# Patient Record
Sex: Female | Born: 1970
Health system: Southern US, Community
[De-identification: ages and names within clinical notes are randomized; demographics above are authoritative.]

## PROBLEM LIST (undated history)

## (undated) ENCOUNTER — Emergency Department (HOSPITAL_COMMUNITY): Admission: EM | Disposition: A | Discharge status: Home / Self Care | Payer: Self-pay

## (undated) DIAGNOSIS — I7300 Raynaud's syndrome without gangrene: Secondary | ICD-10-CM

## (undated) DIAGNOSIS — K219 Gastro-esophageal reflux disease without esophagitis: Secondary | ICD-10-CM

## (undated) DIAGNOSIS — M19112 Post-traumatic osteoarthritis, left shoulder: Secondary | ICD-10-CM

## (undated) DIAGNOSIS — K6389 Other specified diseases of intestine: Secondary | ICD-10-CM

## (undated) DIAGNOSIS — K8689 Other specified diseases of pancreas: Secondary | ICD-10-CM

## (undated) DIAGNOSIS — R569 Unspecified convulsions: Secondary | ICD-10-CM

## (undated) DIAGNOSIS — S6991XA Unspecified injury of right wrist, hand and finger(s), initial encounter: Secondary | ICD-10-CM

## (undated) DIAGNOSIS — K760 Fatty (change of) liver, not elsewhere classified: Secondary | ICD-10-CM

## (undated) DIAGNOSIS — R0600 Dyspnea, unspecified: Secondary | ICD-10-CM

## (undated) DIAGNOSIS — G8929 Other chronic pain: Secondary | ICD-10-CM

## (undated) DIAGNOSIS — R0789 Other chest pain: Secondary | ICD-10-CM

## (undated) DIAGNOSIS — D649 Anemia, unspecified: Secondary | ICD-10-CM

## (undated) DIAGNOSIS — F32A Depression, unspecified: Secondary | ICD-10-CM

## (undated) DIAGNOSIS — M549 Dorsalgia, unspecified: Secondary | ICD-10-CM

## (undated) DIAGNOSIS — R51 Headache: Secondary | ICD-10-CM

## (undated) DIAGNOSIS — N200 Calculus of kidney: Secondary | ICD-10-CM

## (undated) DIAGNOSIS — F5102 Adjustment insomnia: Secondary | ICD-10-CM

## (undated) DIAGNOSIS — IMO0001 Reserved for inherently not codable concepts without codable children: Secondary | ICD-10-CM

## (undated) DIAGNOSIS — F419 Anxiety disorder, unspecified: Secondary | ICD-10-CM

## (undated) DIAGNOSIS — Z5189 Encounter for other specified aftercare: Secondary | ICD-10-CM

## (undated) DIAGNOSIS — E7800 Pure hypercholesterolemia, unspecified: Secondary | ICD-10-CM

## (undated) DIAGNOSIS — K529 Noninfective gastroenteritis and colitis, unspecified: Secondary | ICD-10-CM

## (undated) DIAGNOSIS — G43909 Migraine, unspecified, not intractable, without status migrainosus: Secondary | ICD-10-CM

## (undated) DIAGNOSIS — M542 Cervicalgia: Secondary | ICD-10-CM

## (undated) DIAGNOSIS — R0609 Other forms of dyspnea: Secondary | ICD-10-CM

## (undated) DIAGNOSIS — I10 Essential (primary) hypertension: Secondary | ICD-10-CM

## (undated) DIAGNOSIS — I499 Cardiac arrhythmia, unspecified: Secondary | ICD-10-CM

## (undated) DIAGNOSIS — E559 Vitamin D deficiency, unspecified: Secondary | ICD-10-CM

## (undated) DIAGNOSIS — Z889 Allergy status to unspecified drugs, medicaments and biological substances status: Secondary | ICD-10-CM

## (undated) DIAGNOSIS — F329 Major depressive disorder, single episode, unspecified: Secondary | ICD-10-CM

## (undated) HISTORY — DX: Other specified diseases of intestine: K63.89

## (undated) HISTORY — DX: Depression, unspecified: F32.A

## (undated) HISTORY — DX: Migraine, unspecified, not intractable, without status migrainosus: G43.909

## (undated) HISTORY — PX: BACK SURGERY: SHX140

## (undated) HISTORY — PX: ABDOMINAL HYSTERECTOMY: SHX81

## (undated) HISTORY — PX: ABDOMINOPLASTY: SUR9

## (undated) HISTORY — DX: Gastro-esophageal reflux disease without esophagitis: K21.9

## (undated) HISTORY — DX: Adjustment insomnia: F51.02

## (undated) HISTORY — DX: Anxiety disorder, unspecified: F41.9

## (undated) HISTORY — DX: Hemochromatosis, unspecified: E83.119

## (undated) HISTORY — DX: Calculus of kidney: N20.0

## (undated) HISTORY — DX: Allergy status to unspecified drugs, medicaments and biological substances: Z88.9

## (undated) HISTORY — DX: Fatty (change of) liver, not elsewhere classified: K76.0

## (undated) HISTORY — PX: SHOULDER SURGERY: SHX246

## (undated) HISTORY — DX: Noninfective gastroenteritis and colitis, unspecified: K52.9

## (undated) HISTORY — DX: Major depressive disorder, single episode, unspecified: F32.9

## (undated) HISTORY — DX: Pure hypercholesterolemia, unspecified: E78.00

## (undated) HISTORY — DX: Other specified diseases of pancreas: K86.89

## (undated) HISTORY — DX: Raynaud's syndrome without gangrene: I73.00

## (undated) HISTORY — PX: BREAST SURGERY: SHX581

## (undated) HISTORY — DX: Other chest pain: R07.89

## (undated) HISTORY — DX: Dyspnea, unspecified: R06.00

## (undated) HISTORY — PX: CERVICAL SPINE SURGERY: SHX589

## (undated) HISTORY — DX: Vitamin D deficiency, unspecified: E55.9

## (undated) HISTORY — DX: Other forms of dyspnea: R06.09

## (undated) HISTORY — PX: OTHER SURGICAL HISTORY: SHX169

---

## 2007-03-11 ENCOUNTER — Emergency Department (HOSPITAL_COMMUNITY): Admission: EM | Admit: 2007-03-11 | Discharge: 2007-03-12 | Payer: Self-pay | Admitting: Emergency Medicine

## 2007-04-07 ENCOUNTER — Encounter: Admission: RE | Admit: 2007-04-07 | Discharge: 2007-04-07 | Payer: Self-pay | Admitting: Gastroenterology

## 2007-07-08 ENCOUNTER — Encounter: Admission: RE | Admit: 2007-07-08 | Discharge: 2007-07-08 | Payer: Self-pay | Admitting: Orthopaedic Surgery

## 2007-07-27 ENCOUNTER — Other Ambulatory Visit: Admission: RE | Admit: 2007-07-27 | Discharge: 2007-07-27 | Payer: Self-pay | Admitting: Gynecology

## 2007-08-06 ENCOUNTER — Encounter: Admission: RE | Admit: 2007-08-06 | Discharge: 2007-08-06 | Payer: Self-pay | Admitting: Orthopaedic Surgery

## 2007-09-14 ENCOUNTER — Inpatient Hospital Stay (HOSPITAL_COMMUNITY): Admission: RE | Admit: 2007-09-14 | Discharge: 2007-09-16 | Payer: Self-pay | Admitting: Gynecology

## 2007-09-14 ENCOUNTER — Encounter: Payer: Self-pay | Admitting: Gynecology

## 2007-10-13 ENCOUNTER — Ambulatory Visit: Payer: Self-pay | Admitting: Gynecology

## 2007-10-27 ENCOUNTER — Ambulatory Visit: Payer: Self-pay | Admitting: Gynecology

## 2007-12-09 ENCOUNTER — Ambulatory Visit: Payer: Self-pay | Admitting: Gynecology

## 2008-05-19 ENCOUNTER — Inpatient Hospital Stay (HOSPITAL_COMMUNITY): Admission: AD | Admit: 2008-05-19 | Discharge: 2008-05-19 | Payer: Self-pay | Admitting: Family Medicine

## 2008-05-19 ENCOUNTER — Ambulatory Visit: Payer: Self-pay | Admitting: Obstetrics and Gynecology

## 2008-09-19 ENCOUNTER — Emergency Department (HOSPITAL_BASED_OUTPATIENT_CLINIC_OR_DEPARTMENT_OTHER): Admission: EM | Admit: 2008-09-19 | Discharge: 2008-09-19 | Payer: Self-pay | Admitting: Emergency Medicine

## 2008-09-19 ENCOUNTER — Ambulatory Visit: Payer: Self-pay | Admitting: Diagnostic Radiology

## 2009-03-28 ENCOUNTER — Ambulatory Visit: Payer: Self-pay | Admitting: Interventional Radiology

## 2009-03-28 ENCOUNTER — Emergency Department (HOSPITAL_BASED_OUTPATIENT_CLINIC_OR_DEPARTMENT_OTHER): Admission: EM | Admit: 2009-03-28 | Discharge: 2009-03-28 | Payer: Self-pay | Admitting: Emergency Medicine

## 2009-04-13 ENCOUNTER — Inpatient Hospital Stay (HOSPITAL_COMMUNITY): Admission: AD | Admit: 2009-04-13 | Discharge: 2009-04-13 | Payer: Self-pay | Admitting: Obstetrics & Gynecology

## 2009-05-11 ENCOUNTER — Ambulatory Visit: Payer: Self-pay | Admitting: Obstetrics and Gynecology

## 2009-05-11 LAB — CONVERTED CEMR LAB
ALT: 12 units/L (ref 0–35)
AST: 14 units/L (ref 0–37)
BUN: 11 mg/dL (ref 6–23)
Calcium: 9.6 mg/dL (ref 8.4–10.5)
Creatinine, Ser: 0.54 mg/dL (ref 0.40–1.20)
HCT: 41.1 % (ref 36.0–46.0)
Hemoglobin: 13.6 g/dL (ref 12.0–15.0)
MCV: 97.2 fL (ref 78.0–100.0)
Platelets: 278 10*3/uL (ref 150–400)
Potassium: 3.7 meq/L (ref 3.5–5.3)
RBC: 4.23 M/uL (ref 3.87–5.11)
RDW: 13.7 % (ref 11.5–15.5)
Sodium: 140 meq/L (ref 135–145)
TSH: 1.254 microintl units/mL (ref 0.350–4.500)
Total Protein: 7.2 g/dL (ref 6.0–8.3)

## 2009-06-05 ENCOUNTER — Encounter: Payer: Self-pay | Admitting: Family Medicine

## 2009-06-15 ENCOUNTER — Emergency Department (HOSPITAL_COMMUNITY): Admission: EM | Admit: 2009-06-15 | Discharge: 2009-06-15 | Payer: Self-pay | Admitting: Emergency Medicine

## 2009-06-21 ENCOUNTER — Encounter: Admission: RE | Admit: 2009-06-21 | Discharge: 2009-06-21 | Payer: Self-pay | Admitting: Chiropractic Medicine

## 2009-08-10 ENCOUNTER — Emergency Department (HOSPITAL_COMMUNITY): Admission: EM | Admit: 2009-08-10 | Discharge: 2009-08-10 | Payer: Self-pay | Admitting: Emergency Medicine

## 2009-11-13 ENCOUNTER — Emergency Department (HOSPITAL_BASED_OUTPATIENT_CLINIC_OR_DEPARTMENT_OTHER): Admission: EM | Admit: 2009-11-13 | Discharge: 2009-11-13 | Payer: Self-pay | Admitting: Emergency Medicine
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
## 2010-03-25 ENCOUNTER — Emergency Department (HOSPITAL_BASED_OUTPATIENT_CLINIC_OR_DEPARTMENT_OTHER)
Admission: EM | Admit: 2010-03-25 | Discharge: 2010-03-25 | Disposition: A | Discharge status: Home / Self Care | Payer: Self-pay | Attending: Emergency Medicine | Admitting: Emergency Medicine

## 2010-03-25 ENCOUNTER — Emergency Department (INDEPENDENT_AMBULATORY_CARE_PROVIDER_SITE_OTHER): Payer: Self-pay

## 2010-03-25 DIAGNOSIS — M542 Cervicalgia: Secondary | ICD-10-CM

## 2010-03-25 DIAGNOSIS — R569 Unspecified convulsions: Secondary | ICD-10-CM | POA: Insufficient documentation

## 2010-03-25 DIAGNOSIS — H9319 Tinnitus, unspecified ear: Secondary | ICD-10-CM | POA: Insufficient documentation

## 2010-03-25 DIAGNOSIS — R51 Headache: Secondary | ICD-10-CM | POA: Insufficient documentation

## 2010-03-25 DIAGNOSIS — R42 Dizziness and giddiness: Secondary | ICD-10-CM

## 2010-04-15 LAB — COMPREHENSIVE METABOLIC PANEL
AST: 23 U/L (ref 0–37)
Albumin: 4.2 g/dL (ref 3.5–5.2)
Alkaline Phosphatase: 38 U/L — ABNORMAL LOW (ref 39–117)
BUN: 14 mg/dL (ref 6–23)
Chloride: 109 mEq/L (ref 96–112)
Potassium: 4.2 mEq/L (ref 3.5–5.1)
Total Bilirubin: 1 mg/dL (ref 0.3–1.2)

## 2010-04-15 LAB — URINALYSIS, ROUTINE W REFLEX MICROSCOPIC
Glucose, UA: NEGATIVE mg/dL
Hgb urine dipstick: NEGATIVE
Nitrite: NEGATIVE

## 2010-04-15 LAB — CBC
HCT: 40.7 % (ref 36.0–46.0)
RDW: 13.6 % (ref 11.5–15.5)
WBC: 7.6 10*3/uL (ref 4.0–10.5)

## 2010-04-15 LAB — DIFFERENTIAL
Basophils Absolute: 0 10*3/uL (ref 0.0–0.1)
Basophils Relative: 1 % (ref 0–1)
Eosinophils Absolute: 0 10*3/uL (ref 0.0–0.7)
Eosinophils Relative: 1 % (ref 0–5)
Monocytes Relative: 8 % (ref 3–12)
Neutro Abs: 5 10*3/uL (ref 1.7–7.7)

## 2010-04-15 LAB — POCT PREGNANCY, URINE: Preg Test, Ur: NEGATIVE

## 2010-04-15 LAB — LIPASE, BLOOD: Lipase: 35 U/L (ref 11–59)

## 2010-04-22 LAB — RAPID URINE DRUG SCREEN, HOSP PERFORMED
Amphetamines: NOT DETECTED
Benzodiazepines: NOT DETECTED
Cocaine: NOT DETECTED
Opiates: NOT DETECTED
Tetrahydrocannabinol: NOT DETECTED

## 2010-04-22 LAB — URINALYSIS, ROUTINE W REFLEX MICROSCOPIC: Urobilinogen, UA: 0.2 mg/dL (ref 0.0–1.0)

## 2010-04-22 LAB — POCT PREGNANCY, URINE: Preg Test, Ur: NEGATIVE

## 2010-04-23 LAB — COMPREHENSIVE METABOLIC PANEL
ALT: 18 U/L (ref 0–35)
AST: 18 U/L (ref 0–37)
CO2: 26 mEq/L (ref 19–32)
Calcium: 9 mg/dL (ref 8.4–10.5)
Chloride: 110 mEq/L (ref 96–112)
GFR calc Af Amer: >60 mL/min (ref >60)
GFR calc non Af Amer: >60 mL/min (ref >60)
Glucose, Bld: 89 mg/dL (ref 70–99)
Sodium: 143 mEq/L (ref 135–145)
Total Bilirubin: 0.3 mg/dL (ref 0.3–1.2)

## 2010-04-23 LAB — URINALYSIS, ROUTINE W REFLEX MICROSCOPIC
Bilirubin Urine: NEGATIVE
Hgb urine dipstick: NEGATIVE
Nitrite: NEGATIVE
Specific Gravity, Urine: 1.003 — ABNORMAL LOW (ref 1.005–1.030)
Urobilinogen, UA: 0.2 mg/dL (ref 0.0–1.0)
pH: 6.5 (ref 5.0–8.0)

## 2010-04-23 LAB — DIFFERENTIAL
Basophils Absolute: 0.1 10*3/uL (ref 0.0–0.1)
Basophils Relative: 1 % (ref 0–1)
Eosinophils Absolute: 0.1 10*3/uL (ref 0.0–0.7)
Eosinophils Relative: 1 % (ref 0–5)
Neutrophils Relative %: 60 % (ref 43–77)

## 2010-04-23 LAB — CBC
Hemoglobin: 13.5 g/dL (ref 12.0–15.0)
MCHC: 34.2 g/dL (ref 30.0–36.0)
MCV: 95.8 fL (ref 78.0–100.0)
RBC: 4.12 MIL/uL (ref 3.87–5.11)
WBC: 6.7 10*3/uL (ref 4.0–10.5)

## 2010-04-23 LAB — LIPASE, BLOOD: Lipase: 82 U/L (ref 23–300)

## 2010-05-09 LAB — URINALYSIS, ROUTINE W REFLEX MICROSCOPIC
Bilirubin Urine: NEGATIVE
Ketones, ur: NEGATIVE mg/dL
Nitrite: NEGATIVE
Protein, ur: NEGATIVE mg/dL
Urobilinogen, UA: 0.2 mg/dL (ref 0.0–1.0)

## 2010-05-09 LAB — URINE CULTURE: Colony Count: >=100000

## 2010-05-09 LAB — URINE MICROSCOPIC-ADD ON

## 2010-06-12 NOTE — Op Note (Signed)
[name], [name]     ACCOUNT NO.:  [id number]   MEDICAL RECORD NO.:  [id number]          PATIENT TYPE:  INP   LOCATION:  9304                          FACILITY:  [facility]   PHYSICIAN:  Juan H. Lily Peer, M.D.DATE OF BIRTH:  01/03/1974   DATE OF PROCEDURE:  09/14/2007  DATE OF DISCHARGE:                               OPERATIVE REPORT   SURGEON:  Juan H. Lily Peer, MD.   FIRST ASSISTANT:  Timothy P. Fontaine, MD.   INDICATIONS FOR OPERATION:  A 40 year old gravida 2, para 2 with  debilitating dysmenorrhea, menorrhagia, chronic right lower quadrant  pain and prior history of anemia, and history of multiple cosmetic  reconstructive surgery of her abdomen and umbilicus.  Also, patient with  a small right ovarian cyst measuring 22 x 21 x 25 mm and history of  endometrial polyps.   PREOPERATIVE DIAGNOSES:  1. Dysmenorrhea.  2. Menorrhagia.  3. Endometrial polyps.  4. Chronic right lower quadrant pain.  5. Prior history of endometriosis.  6. Dyspareunia.   POSTOPERATIVE DIAGNOSES:  1. Dysmenorrhea.  2. Menorrhagia.  3. Endometrial polyps.  4. Chronic right lower quadrant pain.  5. Prior history of endometriosis.  6. Dyspareunia.   ANESTHESIA:  General endotracheal anesthesia.   PROCEDURE PERFORMED:  Total abdominal hysterectomy and right salpingo-  oophorectomy.   FINDINGS:  The patient with abdominoplasty scar, anterior abdominal  wall; as well as evidence of umbilical reconstructive surgery.  Prolene  sutures had been found subcutaneously at the time of her hysterectomy.  Patient with prior evidence of tubal ligation bilaterally, some  adhesions to the anterior lower uterine segment near the bladder.  No  gross evidence of endometriosis.   DESCRIPTION OF OPERATION:  After the patient was adequately counseled,  she was taken to the operating room.  She underwent a successful general  endotracheal anesthesia.  She had received a gram of cefoxitin  prophylactically  and had PSA stockings for DVT prophylaxis.  The abdomen  and vagina were prepped and draped in a usual sterile fashion.  After  the drapes were in place, a Pfannenstiel skin incision was made adjacent  to previous abdominoplasty scar.  The incision was carried down  meticulously from the skin, subcutaneous tissue down to the rectus  fascia.  The Prolene sutures were removed.  The fascia was incised in  transverse fashion.  The peritoneal cavity was entered cautiously.  The  O'Connor-O'Sullivan retractors were placed for exposure and the patient  was placed in Trendelenburg position.  Systematic inspection of pelvic  cavity did not demonstrate any gross evidence of endometriosis.  The  right round ligament was suture ligated with 0 Vicryl suture and  transected.  The right ureter was identified and the right  infundibulopelvic ligament was clamped, cut, and suture ligated and free  tied with 0 Vicryl suture followed by transfixion stitch.  The anterior  bladder flap was established and similar procedure was carried down the  contralateral side with the exception that the left tube and ovary were  left in place.  The remaining parametrium consisting of the broad and  cardinal ligaments serially clamped, cut, and suture ligated with 0  Vicryl  suture to the area of both vaginal fornices, whereby 2 curved  Heaney clamps and braced each other between the cervicovaginal fold and  with the use of Jorgenson scissors, the cervix was excised from the  vagina.  The cervix, uterus, and right tube and ovary were passed off  the operative field.  Then both angle sutures were secured with  transfixion stitch of 0 Vicryl suture, and the midline cuff was closed  with a single interrupted suture of 0 Vicryl suture.  The pelvic cavity  was copiously irrigated with normal saline solution after ascertaining  adequate hemostasis, closure was commenced.  The sponges were removed.  Sponge count and needle count  were correct.  The visceral peritoneum was  not reapproximated with the rectus fascia and was closed with running  stitch of 0 Vicryl suture.  The subcutaneous tissue was reapproximated  with 3-0 Vicryl suture and the skin was reapproximated with staples.  Xeroform gauze and 4x8 dressing were placed.  The patient was given 30  mg of Toradol and en route to the recovery room.  She was extubated and  transferred to recovery room in stable vital signs.  Blood loss was  recorded 150 mL, IV fluids, 1200 mL of lactated Ringer's, and urine  output 250 mL.      Juan H. Lily Peer, M.D.  Electronically Signed     JHF/MEDQ  D:  09/14/2007  T:  09/14/2007  Job:  [id number]

## 2010-06-12 NOTE — Discharge Summary (Signed)
NAMEELIZABETHANNE, [name]     ACCOUNT NO.:  [id number]   MEDICAL RECORD NO.:  [id number]          PATIENT TYPE:  INP   LOCATION:  9304                          [facility]ACILITY:  [facility]   PHYSICIAN:  Juan H. Lily Peer, M.D.DATE OF BIRTH:  01/03/1974   DATE OF ADMISSION:  09/14/2007  DATE OF DISCHARGE:  09/16/2007                               DISCHARGE SUMMARY   TOTAL DAYS HOSPITALIZED:  Two.   HISTORY:  The patient is a 40 year old gravida 2, para 2, who on the  morning of September 14, 2007, was taken to the operating room where the  patient underwent a total abdominal hysterectomy with right salpingo-  oophorectomy secondary to dysmenorrhea, menorrhagia, chronic right lower  quadrant pain, dyspareunia, and anemia.  The patient intraoperatively  did well and her blood loss was 150 mL.  On the first 24 hours, she had  adequate output urine wise.  Her vital signs had been stable.  Her Foley  catheter had been removed.  She was started on clear liquid diet and  gradually advanced to a regular diet.  Her postoperative day #1,  hemoglobin and hematocrit were 11.6 and 34.7 respectively.  She began to  ambulate, she showered that evening and on the following day and day #2,  she was tolerating regular diet, was ambulating and voiding well and  tolerating regular diet, had excellent bowel sounds and was ready to be  discharged home.   FINAL DIAGNOSES:  1. Dysmenorrhea.  2. Menorrhagia.  3. Dyspareunia.  4. Chronic pelvic pain.  5. Prior history of endometriosis.  6. Prior history of multiple abdominal surgeries.   PROCEDURE PERFORMED:  Total abdominal hysterectomy with right salpingo-  oophorectomy.   DISPOSITION AND FOLLOWUP:  The patient was discharged home on her second  postoperative day.  She is to follow up in the office in another week to  have her staples removed.  She was given a prescription of Lortab  7.5/500 to take 1 tablet p.o. q.4-6 h. p.r.n. pain and Reglan 10 mg 1  p.o.  q.4-6 h. p.r.n. nausea.      Juan H. Lily Peer, M.D.  Electronically Signed     JHF/MEDQ  D:  09/16/2007  T:  09/17/2007  Job:  [id number]

## 2010-06-12 NOTE — H&P (Signed)
[name], [name]     ACCOUNT NO.:  [id number]   MEDICAL RECORD NO.:  [id number]          PATIENT TYPE:  AMB   LOCATION:  SDC                           [facility]   PHYSICIAN:  Juan H. Lily Peer, M.D.DATE OF BIRTH:  01/03/1974   DATE OF ADMISSION:  DATE OF DISCHARGE:                              HISTORY & PHYSICAL   CHIEF COMPLAINT:  1. Dysmenorrhea.  2. Menorrhagia.  3. Chronic right lower quadrant pain.  4. Anemia.   HISTORY:  The patient is a 40 year old gravida 2, para 2, who is  scheduled to undergo total abdominal hysterectomy with right salpingo-  oophorectomy on Monday, September 14, 2007, secondary to chronic pelvic  pain, dysmenorrhea, menorrhagia, chronic right lower quadrant pain,  dyspareunia, and anemia.  The patient with prior history of  endometriosis.  She has had three laparoscopies in the past, the last  one resulting bilateral tubal ligation.  She has had liposuction,  abdominoplasty, and umbilical reconstruction, as well as breast  augmentation with silicone.  The patient had an ultrasound in the office  on August 06, 2007.  It was noted that the patient had several endometrial  polyps on the sonohysterogram and myometrium consistent with changes of  adenomyosis and a right complex cyst measuring 22 x 21 x 25 mm with  taut.  The left ovary was normal.  We went through a detailed discussion  of the operation its risks, benefits, and pros and cons.   The patient's past medical history:  The patient has had three  laparoscopies, the last one resulting a tubal ligation.  She has had  liposuction and abdominoplasty and umbilical reconstruction and also  breast augmentation with silicone.  She had a history of endometriosis  in the past.  The patient has also had two previous cesarean sections.   Currently, she is on no medications.   PHYSICAL EXAMINATION:  GENERAL:  Well-developed and well-nourished  female.  VITAL SIGNS:  The patient weighs 160  pounds and height 5 feet 6 inches  tall.  HEENT:  Unremarkable.  NECK:  Supple.  Trachea midline.  No carotid bruits and no thyromegaly.  LUNGS:  Clear to auscultation without rhonchi or wheezes.  HEART:  Regular rate and rhythm.  No murmurs or gallops.  BREAST:  Unremarkable.  Evidence of previous breast augmentation was  noted.  ABDOMEN:  Taut from previous abdominoplasty.  A long-transverse lower  abdominal scar is evident as well as scar from umbilical reconstruction.  PELVIC:  Bartholin, urethra, and Skene glands within normal limits.  VAGINA AND CERVIX:  No gross lesions on inspection.  Somewhat narrow  vaginal vault.  Bimanual examination, no palpable mass or tenderness on  the left but slightly tender on the right.  RECTAL:  Deferred.   ASSESSMENT:  A 40 year old gravida 2, para 2, with chronic right lower  quadrant pain, dysmenorrhea, menorrhagia, anemia, dyspareunia, prior  history of endometriosis, and history of multiple abdominal operations  and narrow vagina.  For this reason, a transabdominal hysterectomy along  with a right salpingo-oophorectomy is scheduled.  The risks and benefits  and pros and cons of the operation were discussed.  1.  To include infection for which she will receive prophylaxis      antibiotic.  2. In the event to prevent the deep venous thrombosis, she will have      PSA stockings.  Also, we discussed in the event of any injury or      trauma to internal organs, corrective surgery would need to be done      at that time.  Potential risk of hemorrhage was discussed in the      event that she would need blood or blood products.  She is fully      aware potential risk such as anaphylactic reactions, hepatitis, and      AIDS.  All these issues were discussed with the patient.  She also      knows that we are going to remove the right ovary and leave her      left ovary unless deemed necessary to remove both.  If we were to      remove both, she is  fully aware that then she would need to be on      hormone replacement therapy for several years later with its      potential risk as well.  All these issues were discussed in detail      in Spanish, and all her questions have been answered.  Her most      recent CBC in the office on June 29, was hemoglobin of 10.1,      hematocrit 32, and platelet count 333,000 and she had been placed      on supplemental iron and will be checking her CBC as part of her      preop labs as well.   PLAN:  The patient is scheduled for total abdominal hysterectomy with  right salpingo-oophorectomy on Monday, August 17, at 7:30 a.m. at  [facility].  Please have history and physical available.      Juan H. Lily Peer, M.D.  Electronically Signed     JHF/MEDQ  D:  09/11/2007  T:  09/12/2007  Job:  [id number]

## 2010-07-06 ENCOUNTER — Emergency Department (HOSPITAL_BASED_OUTPATIENT_CLINIC_OR_DEPARTMENT_OTHER)
Admission: EM | Admit: 2010-07-06 | Discharge: 2010-07-06 | Disposition: A | Discharge status: Home / Self Care | Payer: Self-pay | Attending: Emergency Medicine | Admitting: Emergency Medicine

## 2010-07-06 ENCOUNTER — Emergency Department (INDEPENDENT_AMBULATORY_CARE_PROVIDER_SITE_OTHER): Payer: Self-pay

## 2010-07-06 DIAGNOSIS — N83209 Unspecified ovarian cyst, unspecified side: Secondary | ICD-10-CM

## 2010-07-06 DIAGNOSIS — R109 Unspecified abdominal pain: Secondary | ICD-10-CM

## 2010-07-06 DIAGNOSIS — R197 Diarrhea, unspecified: Secondary | ICD-10-CM | POA: Insufficient documentation

## 2010-07-06 DIAGNOSIS — K5730 Diverticulosis of large intestine without perforation or abscess without bleeding: Secondary | ICD-10-CM

## 2010-07-06 DIAGNOSIS — D279 Benign neoplasm of unspecified ovary: Secondary | ICD-10-CM | POA: Insufficient documentation

## 2010-07-06 DIAGNOSIS — G40802 Other epilepsy, not intractable, without status epilepticus: Secondary | ICD-10-CM | POA: Insufficient documentation

## 2010-07-06 DIAGNOSIS — Q619 Cystic kidney disease, unspecified: Secondary | ICD-10-CM

## 2010-07-06 LAB — URINALYSIS, ROUTINE W REFLEX MICROSCOPIC
Ketones, ur: NEGATIVE mg/dL
Leukocytes, UA: NEGATIVE
Nitrite: NEGATIVE
Protein, ur: NEGATIVE mg/dL
pH: 5.5 (ref 5.0–8.0)

## 2010-07-06 LAB — COMPREHENSIVE METABOLIC PANEL
AST: 16 U/L (ref 0–37)
Albumin: 4.2 g/dL (ref 3.5–5.2)
Calcium: 9.5 mg/dL (ref 8.4–10.5)
Creatinine, Ser: 0.5 mg/dL (ref 0.4–1.2)

## 2010-07-06 LAB — DIFFERENTIAL
Basophils Absolute: 0 10*3/uL (ref 0.0–0.1)
Basophils Relative: 0 % (ref 0–1)
Eosinophils Absolute: 0 10*3/uL (ref 0.0–0.7)
Monocytes Relative: 9 % (ref 3–12)
Neutrophils Relative %: 68 % (ref 43–77)

## 2010-07-06 LAB — CBC
MCH: 30.7 pg (ref 26.0–34.0)
MCHC: 33.3 g/dL (ref 30.0–36.0)
Platelets: 300 10*3/uL (ref 150–400)
RBC: 4.53 MIL/uL (ref 3.87–5.11)

## 2010-07-06 MED ORDER — IOHEXOL 300 MG/ML  SOLN
100.0000 mL | Freq: Once | INTRAMUSCULAR | Status: AC | PRN
  Administered 2010-07-06: 100 mL via INTRAVENOUS

## 2010-10-17 ENCOUNTER — Emergency Department (HOSPITAL_BASED_OUTPATIENT_CLINIC_OR_DEPARTMENT_OTHER)
Admission: EM | Admit: 2010-10-17 | Discharge: 2010-10-17 | Disposition: A | Discharge status: Home / Self Care | Payer: Self-pay | Attending: Emergency Medicine | Admitting: Emergency Medicine

## 2010-10-17 ENCOUNTER — Encounter: Payer: Self-pay | Admitting: *Deleted

## 2010-10-17 DIAGNOSIS — R1032 Left lower quadrant pain: Secondary | ICD-10-CM | POA: Insufficient documentation

## 2010-10-17 DIAGNOSIS — R1110 Vomiting, unspecified: Secondary | ICD-10-CM | POA: Insufficient documentation

## 2010-10-17 DIAGNOSIS — H8109 Meniere's disease, unspecified ear: Secondary | ICD-10-CM | POA: Insufficient documentation

## 2010-10-17 LAB — COMPREHENSIVE METABOLIC PANEL
AST: 17 U/L (ref 0–37)
BUN: 21 mg/dL (ref 6–23)
CO2: 28 mEq/L (ref 19–32)
Chloride: 100 mEq/L (ref 96–112)
Creatinine, Ser: 1.4 mg/dL — ABNORMAL HIGH (ref 0.50–1.10)
GFR calc non Af Amer: 43 mL/min — ABNORMAL LOW (ref >60)
Total Bilirubin: 0.5 mg/dL (ref 0.3–1.2)

## 2010-10-17 LAB — URINALYSIS, ROUTINE W REFLEX MICROSCOPIC
Hgb urine dipstick: NEGATIVE
Protein, ur: NEGATIVE mg/dL
Urobilinogen, UA: 1 mg/dL (ref 0.0–1.0)

## 2010-10-17 LAB — CBC
HCT: 41.5 % (ref 36.0–46.0)
Hemoglobin: 13.8 g/dL (ref 12.0–15.0)
MCV: 95 fL (ref 78.0–100.0)
RBC: 4.37 MIL/uL (ref 3.87–5.11)
WBC: 8.7 10*3/uL (ref 4.0–10.5)

## 2010-10-17 MED ORDER — ACETAMINOPHEN 325 MG PO TABS
650.0000 mg | ORAL_TABLET | Freq: Once | ORAL | Status: AC
  Administered 2010-10-17: 650 mg via ORAL
  Filled 2010-10-17: qty 2

## 2010-10-17 MED ORDER — MORPHINE SULFATE 4 MG/ML IJ SOLN
4.0000 mg | Freq: Once | INTRAMUSCULAR | Status: AC
  Administered 2010-10-17: 4 mg via INTRAVENOUS
  Filled 2010-10-17: qty 1

## 2010-10-17 MED ORDER — SODIUM CHLORIDE 0.9 % IV BOLUS (SEPSIS)
1000.0000 mL | Freq: Once | INTRAVENOUS | Status: AC
  Administered 2010-10-17: 1000 mL via INTRAVENOUS

## 2010-10-17 MED ORDER — MECLIZINE HCL 25 MG PO TABS: 25.0000 mg | ORAL_TABLET | Freq: Three times a day (TID) | ORAL | Status: AC | PRN

## 2010-10-17 MED ORDER — ONDANSETRON HCL 4 MG/2ML IJ SOLN
4.0000 mg | Freq: Once | INTRAMUSCULAR | Status: AC
  Administered 2010-10-17: 4 mg via INTRAVENOUS
  Filled 2010-10-17: qty 2

## 2010-10-17 MED ORDER — LORAZEPAM 2 MG/ML IJ SOLN
1.0000 mg | Freq: Once | INTRAMUSCULAR | Status: AC
  Administered 2010-10-17: 1 mg via INTRAVENOUS
  Filled 2010-10-17: qty 1

## 2010-10-17 MED ORDER — MECLIZINE HCL 25 MG PO TABS
25.0000 mg | ORAL_TABLET | Freq: Once | ORAL | Status: AC
  Administered 2010-10-17: 25 mg via ORAL
  Filled 2010-10-17: qty 1

## 2010-10-17 NOTE — ED Notes (Signed)
Ambulated pt to bathroom. Pt c/o dizziness when standing and HA. Upon return to room BP 110/60 p 70, MD made aware.

## 2010-10-17 NOTE — ED Provider Notes (Signed)
History     CSN: [id number] Arrival date & time: 10/17/2010  3:08 PM   Chief Complaint  Patient presents with  . Emesis  . Dizziness     (Include location/radiation/quality/duration/timing/severity/associated sxs/prior treatment) HPI Pt reports onset of room spinning dizziness and multiple episodes of vomiting yesterday. She has had continued dizziness today although vomiting has improved. She states symptoms worse with sitting up and standing. She has had LLQ pain for the last several months, unchanged. Some constipation recently, also unchanged. Pt has also had ringing in her ears and headache.   History reviewed. No pertinent past medical history.   Past Surgical History  Procedure Date  . Abdominal hysterectomy     History reviewed. No pertinent family history.  History  Substance Use Topics  . Smoking status: Not on file  . Smokeless tobacco: Not on file  . Alcohol Use: No    OB History    Grav Para Term Preterm Abortions TAB SAB Ect Mult Living                  Review of Systems All other systems reviewed and are negative except as noted in HPI.   Allergies  Latex and Naproxen  Home Medications   Current Outpatient Rx  Name Route Sig Dispense Refill  . CARBAMAZEPINE PO Oral Take 1 tablet by mouth daily.      Marland Kitchen HYDROCODONE-ACETAMINOPHEN 5-500 MG PO TABS Oral Take 1 tablet by mouth daily as needed. For pain     . IBUPROFEN 200 MG PO TABS Oral Take 400 mg by mouth every 6 (six) hours as needed. pain     . OXYMETAZOLINE HCL 0.05 % NA SOLN Nasal Place 1 spray into the nose daily as needed. Congestion        Physical Exam    BP 75/45  Pulse 83  Temp(Src) 99.1 F (37.3 C) (Oral)  Resp 18  Ht 5\' 9"  (1.753 m)  Wt 189 lb (85.73 kg)  BMI 27.91 kg/m2  SpO2 100%  Physical Exam  Nursing note and vitals reviewed. Constitutional: She is oriented to person, place, and time. She appears well-developed and well-nourished.  HENT:  Head: Normocephalic and  atraumatic.       TM normal bilaterally  Eyes: EOM are normal. Pupils are equal, round, and reactive to light.  Neck: Normal range of motion. Neck supple.  Cardiovascular: Normal rate, normal heart sounds and intact distal pulses.   Pulmonary/Chest: Effort normal and breath sounds normal.  Abdominal: Bowel sounds are normal. She exhibits no distension. There is no tenderness.  Musculoskeletal: Normal range of motion. She exhibits no edema and no tenderness.  Neurological: She is alert and oriented to person, place, and time. She has normal strength. No cranial nerve deficit or sensory deficit.       No focal neuro findings except for mild nystagmus worse with sitting up  Skin: Skin is warm and dry. No rash noted.  Psychiatric: She has a normal mood and affect.    ED Course  Procedures  Results for orders placed during the hospital encounter of 10/17/10  CBC      Component Value Range   WBC 8.7  4.0 - 10.5 (K/uL)   RBC 4.37  3.87 - 5.11 (MIL/uL)   Hemoglobin 13.8  12.0 - 15.0 (g/dL)   HCT 40.9  81.1 - 91.4 (%)   MCV 95.0  78.0 - 100.0 (fL)   MCH 31.6  26.0 - 34.0 (pg)   MCHC  33.3  30.0 - 36.0 (g/dL)   RDW 95.6  21.3 - 08.6 (%)   Platelets 327  150 - 400 (K/uL)    MDM Pt attempted to ambulate after 1000cc of fluid with continued vertiginous symptoms, although BP had improved some. Given a second liter and ativan and she is feeling much better now. Still some dizziness and continued headache, but able to walk with steady gait in the ED. Suspect Meniere's disease given constellation of vertigo, headache and ear ringing. D/c with antivert, ENT followup. Advised to drink plenty of fluid and return for any worsening dizziness or persistent vomiting.        Charles B. Bernette Mayers, MD 10/17/10 2050

## 2010-10-17 NOTE — ED Notes (Signed)
Pt c/o abd pain with n/v and dizzy x 1 week

## 2010-10-17 NOTE — ED Notes (Signed)
Instructed patient that we need a urine sample for labs

## 2010-10-17 NOTE — ED Notes (Signed)
I ambulated patient, she was unsure of herself and held my arm or hand rail to and from room. Patient stated she felt dizzy during and after ambulation. I helped patient back into bed.

## 2010-10-24 ENCOUNTER — Emergency Department (HOSPITAL_COMMUNITY): Payer: Medicaid Other

## 2010-10-24 ENCOUNTER — Emergency Department (HOSPITAL_COMMUNITY)
Admission: EM | Admit: 2010-10-24 | Discharge: 2010-10-24 | Disposition: A | Discharge status: Home / Self Care | Payer: Medicaid Other | Attending: Emergency Medicine | Admitting: Emergency Medicine

## 2010-10-24 DIAGNOSIS — Z79899 Other long term (current) drug therapy: Secondary | ICD-10-CM | POA: Insufficient documentation

## 2010-10-24 DIAGNOSIS — R112 Nausea with vomiting, unspecified: Secondary | ICD-10-CM | POA: Insufficient documentation

## 2010-10-24 DIAGNOSIS — I959 Hypotension, unspecified: Secondary | ICD-10-CM | POA: Insufficient documentation

## 2010-10-24 DIAGNOSIS — Z8543 Personal history of malignant neoplasm of ovary: Secondary | ICD-10-CM | POA: Insufficient documentation

## 2010-10-24 DIAGNOSIS — D259 Leiomyoma of uterus, unspecified: Secondary | ICD-10-CM | POA: Insufficient documentation

## 2010-10-24 DIAGNOSIS — G40802 Other epilepsy, not intractable, without status epilepticus: Secondary | ICD-10-CM | POA: Insufficient documentation

## 2010-10-24 DIAGNOSIS — R109 Unspecified abdominal pain: Secondary | ICD-10-CM | POA: Insufficient documentation

## 2010-10-24 LAB — DIFFERENTIAL
Eosinophils Absolute: 0 10*3/uL (ref 0.0–0.7)
Eosinophils Relative: 0 % (ref 0–5)
Lymphs Abs: 2.2 10*3/uL (ref 0.7–4.0)
Monocytes Absolute: 0.5 10*3/uL (ref 0.1–1.0)
Monocytes Relative: 5 % (ref 3–12)

## 2010-10-24 LAB — URINALYSIS, ROUTINE W REFLEX MICROSCOPIC
Glucose, UA: NEGATIVE mg/dL
Leukocytes, UA: NEGATIVE
Nitrite: NEGATIVE
Protein, ur: NEGATIVE mg/dL

## 2010-10-24 LAB — COMPREHENSIVE METABOLIC PANEL
AST: 15 U/L (ref 0–37)
Albumin: 4 g/dL (ref 3.5–5.2)
CO2: 27 mEq/L (ref 19–32)
Calcium: 9 mg/dL (ref 8.4–10.5)
Creatinine, Ser: 0.78 mg/dL (ref 0.50–1.10)
GFR calc non Af Amer: >60 mL/min (ref >60)

## 2010-10-24 LAB — LACTIC ACID, PLASMA: Lactic Acid, Venous: <0.2 mmol/L — ABNORMAL LOW (ref 0.5–2.2)

## 2010-10-24 LAB — CBC
MCH: 32.1 pg (ref 26.0–34.0)
MCV: 96.9 fL (ref 78.0–100.0)
Platelets: 329 10*3/uL (ref 150–400)
RDW: 13.4 % (ref 11.5–15.5)

## 2010-10-24 MED ORDER — IOHEXOL 300 MG/ML  SOLN
100.0000 mL | Freq: Once | INTRAMUSCULAR | Status: AC | PRN
  Administered 2010-10-24: 100 mL via INTRAVENOUS

## 2010-10-25 LAB — URINE CULTURE: Culture  Setup Time: 201209270051

## 2010-10-26 LAB — URINE MICROSCOPIC-ADD ON

## 2010-10-26 LAB — URINALYSIS, ROUTINE W REFLEX MICROSCOPIC
Glucose, UA: NEGATIVE
Leukocytes, UA: NEGATIVE
Specific Gravity, Urine: 1.015
pH: 5.5

## 2010-10-26 LAB — CBC
Hemoglobin: 13.2
RBC: 4.47

## 2010-10-26 LAB — HCG, SERUM, QUALITATIVE: Preg, Serum: NEGATIVE

## 2010-10-31 LAB — CULTURE, BLOOD (ROUTINE X 2)
Culture  Setup Time: 201209270150
Culture: NO GROWTH

## 2010-12-06 ENCOUNTER — Encounter (HOSPITAL_COMMUNITY): Payer: Self-pay | Admitting: *Deleted

## 2010-12-06 ENCOUNTER — Emergency Department (HOSPITAL_COMMUNITY)
Admission: EM | Admit: 2010-12-06 | Discharge: 2010-12-06 | Disposition: A | Discharge status: Home / Self Care | Payer: Medicaid Other | Attending: Emergency Medicine | Admitting: Emergency Medicine

## 2010-12-06 ENCOUNTER — Emergency Department (HOSPITAL_COMMUNITY): Payer: Medicaid Other

## 2010-12-06 DIAGNOSIS — R1032 Left lower quadrant pain: Secondary | ICD-10-CM | POA: Insufficient documentation

## 2010-12-06 DIAGNOSIS — E860 Dehydration: Secondary | ICD-10-CM | POA: Insufficient documentation

## 2010-12-06 DIAGNOSIS — K5900 Constipation, unspecified: Secondary | ICD-10-CM | POA: Insufficient documentation

## 2010-12-06 LAB — CBC
HCT: 45.7 % (ref 36.0–46.0)
MCHC: 35.4 g/dL (ref 30.0–36.0)
Platelets: 301 10*3/uL (ref 150–400)
RDW: 12.8 % (ref 11.5–15.5)
WBC: 8.6 10*3/uL (ref 4.0–10.5)

## 2010-12-06 LAB — COMPREHENSIVE METABOLIC PANEL
ALT: 14 U/L (ref 0–35)
AST: 20 U/L (ref 0–37)
Albumin: 4.8 g/dL (ref 3.5–5.2)
Calcium: 11.1 mg/dL — ABNORMAL HIGH (ref 8.4–10.5)
Chloride: 88 mEq/L — ABNORMAL LOW (ref 96–112)
Creatinine, Ser: 0.75 mg/dL (ref 0.50–1.10)
Sodium: 131 mEq/L — ABNORMAL LOW (ref 135–145)

## 2010-12-06 LAB — DIFFERENTIAL
Basophils Absolute: 0 10*3/uL (ref 0.0–0.1)
Basophils Relative: 0 % (ref 0–1)
Eosinophils Relative: 0 % (ref 0–5)
Lymphocytes Relative: 20 % (ref 12–46)
Monocytes Absolute: 1.1 10*3/uL — ABNORMAL HIGH (ref 0.1–1.0)
Neutro Abs: 5.7 10*3/uL (ref 1.7–7.7)

## 2010-12-06 LAB — URINALYSIS, ROUTINE W REFLEX MICROSCOPIC
Glucose, UA: NEGATIVE mg/dL
Hgb urine dipstick: NEGATIVE
Leukocytes, UA: NEGATIVE
pH: 6.5 (ref 5.0–8.0)

## 2010-12-06 MED ORDER — ONDANSETRON HCL 4 MG/2ML IJ SOLN
4.0000 mg | Freq: Once | INTRAMUSCULAR | Status: AC
  Administered 2010-12-06: 4 mg via INTRAVENOUS
  Filled 2010-12-06: qty 2

## 2010-12-06 MED ORDER — POTASSIUM CHLORIDE 20 MEQ PO PACK
20.0000 meq | PACK | Freq: Two times a day (BID) | ORAL | Status: DC
  Filled 2010-12-06: qty 1

## 2010-12-06 MED ORDER — IOHEXOL 300 MG/ML  SOLN: 100.0000 mL | Freq: Once | INTRAMUSCULAR | Status: DC | PRN

## 2010-12-06 MED ORDER — DICYCLOMINE HCL 20 MG PO TABS: 20.0000 mg | ORAL_TABLET | Freq: Four times a day (QID) | ORAL | Status: DC

## 2010-12-06 MED ORDER — SODIUM CHLORIDE 0.9 % IV BOLUS (SEPSIS)
1000.0000 mL | Freq: Once | INTRAVENOUS | Status: AC
  Administered 2010-12-06: 1000 mL via INTRAVENOUS

## 2010-12-06 MED ORDER — POTASSIUM CHLORIDE 10 MEQ PO TBCR
20.0000 meq | EXTENDED_RELEASE_TABLET | Freq: Two times a day (BID) | ORAL | Status: DC
  Administered 2010-12-06: 20 meq via ORAL
  Filled 2010-12-06: qty 2

## 2010-12-06 MED ORDER — HYDROCODONE-ACETAMINOPHEN 5-325 MG PO TABS: 1.0000 | ORAL_TABLET | Freq: Four times a day (QID) | ORAL | Status: AC | PRN

## 2010-12-06 MED ORDER — HYDROMORPHONE HCL PF 1 MG/ML IJ SOLN
1.0000 mg | Freq: Once | INTRAMUSCULAR | Status: AC
  Administered 2010-12-06: 1 mg via INTRAVENOUS
  Filled 2010-12-06: qty 1

## 2010-12-06 MED ORDER — SODIUM CHLORIDE 0.9 % IV SOLN
999.0000 mL | INTRAVENOUS | Status: DC
  Administered 2010-12-06: 999 mL via INTRAVENOUS

## 2010-12-06 MED ORDER — ONDANSETRON 8 MG PO TBDP: 8.0000 mg | ORAL_TABLET | Freq: Three times a day (TID) | ORAL | Status: AC | PRN

## 2010-12-06 NOTE — ED Notes (Signed)
CT notified that pt has finished contrast.  

## 2010-12-06 NOTE — ED Notes (Signed)
Patient is resting quietly with family at bedside

## 2010-12-06 NOTE — ED Notes (Signed)
To ed for further eval of abd pain, constipation for past 8 days, HA, vomiting, and weight loss. Ambulatory. Skin w/d, resp e/u.

## 2010-12-06 NOTE — ED Notes (Signed)
Patient also reports she has pain when she voids.  She states she also has noted irregular voiding patterns

## 2010-12-06 NOTE — ED Provider Notes (Signed)
History     CSN: [id number] Arrival date & time: 12/06/2010  1:37 PM   First MD Initiated Contact with Patient 12/06/10 1447      Chief Complaint  Patient presents with  . Abdominal Pain    (Consider location/radiation/quality/duration/timing/severity/associated sxs/prior treatment) HPI Comments: Pt has had vomiting.  She saw her doctor and was sent for further evaluation.  Pt has been seeing Dr Elnoria Howard and is due to see him again on the 12th.  He prescribed ultram.  She is not sure what the cause of her pain is.  Patient is a 40 y.o. female presenting with abdominal pain. The history is provided by the patient.  Abdominal Pain The primary symptoms of the illness include abdominal pain. Episode onset: 2 months ago. The onset of the illness was gradual.  The abdominal pain has been unchanged since its onset. The abdominal pain is located in the LLQ.  Additional symptoms associated with the illness include constipation. Symptoms associated with the illness do not include back pain.    History reviewed. No pertinent past medical history.  Past Surgical History  Procedure Date  . Abdominal hysterectomy     No family history on file.  History  Substance Use Topics  . Smoking status: Not on file  . Smokeless tobacco: Not on file  . Alcohol Use: No    OB History    Grav Para Term Preterm Abortions TAB SAB Ect Mult Living                  Review of Systems  Gastrointestinal: Positive for abdominal pain and constipation.  Musculoskeletal: Negative for back pain.  Neurological: Positive for headaches.  All other systems reviewed and are negative.    Allergies  Latex and Naproxen  Home Medications    see nursing note  BP 131/89  Pulse 114  Temp(Src) 97.3 F (36.3 C) (Oral)  Resp 20  SpO2 98%  Physical Exam  Nursing note and vitals reviewed. Constitutional: She appears well-developed and well-nourished. No distress.  HENT:  Head: Normocephalic and atraumatic.    Right Ear: External ear normal.  Left Ear: External ear normal.  Eyes: Conjunctivae are normal. Right eye exhibits no discharge. Left eye exhibits no discharge. No scleral icterus.  Neck: Neck supple. No tracheal deviation present.  Cardiovascular: Normal rate, regular rhythm and intact distal pulses.   Pulmonary/Chest: Effort normal and breath sounds normal. No stridor. No respiratory distress. She has no wheezes. She has no rales.  Abdominal: Soft. Bowel sounds are normal. She exhibits no distension and no mass. There is tenderness in the left lower quadrant. There is guarding. There is no rebound.  Musculoskeletal: She exhibits no edema and no tenderness.  Neurological: She is alert. She has normal strength. No sensory deficit. Cranial nerve deficit:  no gross defecits noted. She exhibits normal muscle tone. She displays no seizure activity. Coordination normal.  Skin: Skin is warm and dry. No rash noted.  Psychiatric: She has a normal mood and affect.    ED Course  Procedures (including critical care time)  Labs Reviewed  CBC - Abnormal; Notable for the following:    Hemoglobin 16.2 (*)    All other components within normal limits  DIFFERENTIAL - Abnormal; Notable for the following:    Monocytes Relative 13 (*)    Monocytes Absolute 1.1 (*)    All other components within normal limits  COMPREHENSIVE METABOLIC PANEL - Abnormal; Notable for the following:    Sodium 131 (*)  Potassium 3.1 (*)    Chloride 88 (*)    BUN 34 (*)    Calcium 11.1 (*)    Total Protein 9.1 (*)    All other components within normal limits  URINALYSIS, ROUTINE W REFLEX MICROSCOPIC - Abnormal; Notable for the following:    Specific Gravity, Urine >1.046 (*)    Ketones, ur 15 (*)    All other components within normal limits  LIPASE, BLOOD   Ct Abdomen Pelvis W Contrast  12/06/2010  *RADIOLOGY REPORT*  Clinical Data: Left lower quadrant abdominal pain.  CT ABDOMEN AND PELVIS WITH CONTRAST  Technique:   Multidetector CT imaging of the abdomen and pelvis was performed following the standard protocol during bolus administration of intravenous contrast.  Contrast:  100 ml Omnipaque-300  Comparison: CT scan 10/24/2010.  Findings: The lung bases are clear.  Remote left lower rib fractures are noted.  The liver is unremarkable.  No focal lesions or biliary dilatation. Gallbladder is normal.  No common bile duct dilatation.  The pancreas is normal.  The spleen is normal in size.  No focal lesions.  The adrenal glands and kidneys are unremarkable except for a stable left renal calculus.  No obstructing ureteral calculi.  The stomach, duodenum, small bowel and colon are unremarkable.  No inflammatory changes or mass lesions.  No obstruction.  Moderate stool throughout the colon.  The appendix is normal.  The uterus is surgically absent.  The left ovary is still present and appears normal.  The right ovary is not seen.  No pelvic mass, adenopathy or free pelvic fluid collections.  The bladder is normal.  No inguinal mass or adenopathy.  The aorta is normal in caliber.  The major branch vessels are normal.  The portal and splenic veins are patent.  No mesenteric or retroperitoneal masses or lymphadenopathy.  No abdominal wall hernia.  The bony structures are unremarkable.  IMPRESSION:  1.  No acute abdominal/pelvic findings, mass lesions or adenopathy. 2.  Stable left renal calculus. 3.  Status post hysterectomy and right oophorectomy.  Original Report Authenticated By: P. Loralie Champagne, M.D.     Diagnosis: Abdominal pain, dehydration    MDM  2:55 PM patient has persistent pain for the last months now associated with weight loss. The patient has significant tenderness in her left lower quadrant. Cannot exclude diverticulitis at this point. With the chronicity of her symptoms I feel that it is reasonable for CT scanning at this time.  6:30 PM CT scan without any acute findings to suggest the etiology of her pain.  Patient does have some dehydration. She has been hydrated in the ED. Patient will be discharged home to follow up with her GI doctor. I will try a trial of Bentyl hydrocodone and antiemetics.      Celene Kras, MD 12/06/10 [phone]

## 2010-12-06 NOTE — ED Notes (Signed)
Patient states she has had stomach pain and rectal pain for 2 mths and she lost 12 pounds in the past 2 weeks..  Patient reports onset of n/v this weekend.  Patient was sent to ED for further eval of her pain by Dr Yetta Barre for further eval of her pain and sx.  Patient states she is also feeling constipated.

## 2010-12-16 ENCOUNTER — Encounter (HOSPITAL_BASED_OUTPATIENT_CLINIC_OR_DEPARTMENT_OTHER): Payer: Self-pay | Admitting: Emergency Medicine

## 2010-12-16 ENCOUNTER — Emergency Department (HOSPITAL_BASED_OUTPATIENT_CLINIC_OR_DEPARTMENT_OTHER)
Admission: EM | Admit: 2010-12-16 | Discharge: 2010-12-16 | Disposition: A | Discharge status: Home / Self Care | Payer: Medicaid Other | Attending: Emergency Medicine | Admitting: Emergency Medicine

## 2010-12-16 DIAGNOSIS — B029 Zoster without complications: Secondary | ICD-10-CM

## 2010-12-16 MED ORDER — OXYCODONE-ACETAMINOPHEN 5-325 MG PO TABS: 2.0000 | ORAL_TABLET | ORAL | Status: AC | PRN

## 2010-12-16 MED ORDER — VALACYCLOVIR HCL 1 G PO TABS: 1000.0000 mg | ORAL_TABLET | Freq: Three times a day (TID) | ORAL | Status: AC

## 2010-12-16 NOTE — ED Notes (Signed)
Pt c/o rash to LT side trunk since Fri 

## 2010-12-16 NOTE — ED Provider Notes (Signed)
History     CSN: [id number] Arrival date & time: 12/16/2010 11:50 AM   First MD Initiated Contact with Patient 12/16/10 1204      Chief Complaint  Patient presents with  . Rash    (Consider location/radiation/quality/duration/timing/severity/associated sxs/prior treatment) Patient is a 40 y.o. female presenting with rash. The history is provided by the patient. No language interpreter was used.  Rash  This is a new problem. The current episode started 2 days ago. The problem has not changed since onset.The problem is associated with an unknown factor. There has been no fever. The rash is present on the torso. The pain is moderate. The pain has been constant since onset. Associated symptoms include blisters and pain. She has tried nothing for the symptoms.    History reviewed. No pertinent past medical history.  Past Surgical History  Procedure Date  . Abdominal hysterectomy     No family history on file.  History  Substance Use Topics  . Smoking status: Not on file  . Smokeless tobacco: Not on file  . Alcohol Use: No    OB History    Grav Para Term Preterm Abortions TAB SAB Ect Mult Living                  Review of Systems  Constitutional: Negative.   Respiratory: Negative.   Cardiovascular: Negative.   Skin: Positive for rash.  Neurological: Negative.     Allergies  Latex and Naproxen  Home Medications     BP 130/88  Pulse 87  Temp(Src) 98.2 F (36.8 C) (Oral)  Resp 16  SpO2 100%  Physical Exam  Nursing note and vitals reviewed. Constitutional: She is oriented to person, place, and time. She appears well-developed and well-nourished.  Cardiovascular: Normal rate and regular rhythm.   Pulmonary/Chest: Effort normal and breath sounds normal.  Neurological: She is alert and oriented to person, place, and time.  Skin:       Pt has a grouped vesicular rash noted on the left lower rib and back    ED Course  Procedures (including critical care  time)  Labs Reviewed - No data to display No results found.   1. Shingles       MDM  No infection noted:will put on antiviral and pain medication        Teressa Lower, NP 12/16/10 1226

## 2010-12-16 NOTE — ED Provider Notes (Signed)
History/physical exam/procedure(s) were performed by non-physician practitioner and as supervising physician I was immediately available for consultation/collaboration. I have reviewed all notes and am in agreement with care and plan.   Hilario Quarry, MD 12/16/10 [phone]

## 2010-12-25 ENCOUNTER — Other Ambulatory Visit: Payer: Self-pay | Admitting: Gastroenterology

## 2010-12-25 DIAGNOSIS — R112 Nausea with vomiting, unspecified: Secondary | ICD-10-CM

## 2010-12-25 DIAGNOSIS — R1013 Epigastric pain: Secondary | ICD-10-CM

## 2011-01-07 ENCOUNTER — Other Ambulatory Visit: Payer: Self-pay | Admitting: Gastroenterology

## 2011-01-07 DIAGNOSIS — R1032 Left lower quadrant pain: Secondary | ICD-10-CM

## 2011-01-07 DIAGNOSIS — R634 Abnormal weight loss: Secondary | ICD-10-CM

## 2011-01-07 DIAGNOSIS — R112 Nausea with vomiting, unspecified: Secondary | ICD-10-CM

## 2011-01-07 DIAGNOSIS — R1013 Epigastric pain: Secondary | ICD-10-CM

## 2011-01-14 ENCOUNTER — Encounter (HOSPITAL_COMMUNITY)
Admission: RE | Admit: 2011-01-14 | Discharge: 2011-01-14 | Disposition: A | Discharge status: Home / Self Care | Payer: Medicaid Other | Source: Ambulatory Visit | Attending: Gastroenterology | Admitting: Gastroenterology

## 2011-01-14 DIAGNOSIS — R109 Unspecified abdominal pain: Secondary | ICD-10-CM | POA: Insufficient documentation

## 2011-01-14 DIAGNOSIS — R112 Nausea with vomiting, unspecified: Secondary | ICD-10-CM | POA: Insufficient documentation

## 2011-01-14 DIAGNOSIS — R1013 Epigastric pain: Secondary | ICD-10-CM | POA: Insufficient documentation

## 2011-01-14 MED ORDER — TECHNETIUM TC 99M MEBROFENIN IV KIT
5.1000 | PACK | Freq: Once | INTRAVENOUS | Status: AC | PRN
  Administered 2011-01-14: 5.1 via INTRAVENOUS

## 2011-01-14 MED ORDER — SINCALIDE 5 MCG IJ SOLR: 0.0200 ug/kg | Freq: Once | INTRAMUSCULAR | Status: DC

## 2011-02-11 ENCOUNTER — Ambulatory Visit (INDEPENDENT_AMBULATORY_CARE_PROVIDER_SITE_OTHER): Payer: Medicaid Other | Admitting: Surgery

## 2011-02-11 ENCOUNTER — Encounter (INDEPENDENT_AMBULATORY_CARE_PROVIDER_SITE_OTHER): Payer: Self-pay | Admitting: Surgery

## 2011-02-11 VITALS — BP 132/98 | HR 80 | Temp 97.6°F | Resp 18 | Ht 68.0 in | Wt 171.4 lb

## 2011-02-11 DIAGNOSIS — K828 Other specified diseases of gallbladder: Secondary | ICD-10-CM

## 2011-02-11 NOTE — Progress Notes (Signed)
Patient ID: [name], female   DOB: 01/04/1976, 41 y.o.   MRN: [id number]  Chief Complaint  Patient presents with  . Other    new pt- eval biliary dysk    HPI [name] is a 41 y.o. female.  The patient presents at the request of Dr. Hong due to abdominal pain. She has a six-month history of right upper quadrant abdominal pain which radiates to her back. It has become more severe over the last month. Food makes it worse and her causes nausea and vomiting. The pain is moderate. She has lost weight. HIDA study was done which showed ejection fraction of 80% and pain with CCK. CT scan of abdomen from November 2012 showed no abnormality. Ultrasound was done in 2009 which showed no gallstones for similar complaints.        HPI  History reviewed. No pertinent past medical history.  Past Surgical History  Procedure Date  . Abdominal hysterectomy     Family History  Problem Relation Age of Onset  . Cancer Mother     colon, skin  . Heart disease Father     Social History History  Substance Use Topics  . Smoking status: Never Smoker   . Smokeless tobacco: Not on file  . Alcohol Use: No    Allergies  Allergen Reactions  . Latex Anaphylaxis  . Naproxen Anaphylaxis      Review of Systems Review of Systems  Constitutional: Positive for appetite change and unexpected weight change.  HENT: Negative.   Eyes: Negative.   Respiratory: Negative.   Cardiovascular: Negative.   Gastrointestinal: Positive for nausea, vomiting and abdominal pain.  Genitourinary: Negative.   Musculoskeletal: Negative.   Neurological: Negative.   Hematological: Negative.   Psychiatric/Behavioral: Negative.     Blood pressure 132/98, pulse 80, temperature 97.6 F (36.4 C), temperature source Temporal, resp. rate 18, height 5' 8" (1.727 m), weight 171 lb 6.4 oz (77.747 kg).  Physical Exam Physical Exam  Constitutional: She is oriented to person, place, and time. She appears  well-developed and well-nourished.  HENT:  Head: Normocephalic and atraumatic.  Eyes: EOM are normal.  Neck: Normal range of motion. Neck supple.  Cardiovascular: Normal rate and regular rhythm.   Pulmonary/Chest: Effort normal and breath sounds normal.  Abdominal: There is tenderness.       RUQ  Musculoskeletal: Normal range of motion.  Neurological: She is alert and oriented to person, place, and time.  Skin: Skin is warm and dry.  Psychiatric: She has a normal mood and affect. Thought content normal.    Data Reviewed HIDA 80 %  CCK PAIN CT ABDOMEN  Normal  Nov 2012  Assessment    Biliary dyskinesia.    Plan    I discussed options of treatment for her condition and success rates. Surgery is successful at relieving symptoms of biliary dyskinesia 50% to 60% of the time. She does have symptoms consistent with biliary colic. CT scan done in November shows no other abnormality. Pain with CCK administration is the most reliable predicting factor for success of cholecystectomy in this setting. I used a translator since ring which is somewhat broken to make sure we understand each other. She voiced understanding and wished to proceed with laparoscopic cholecystectomy and cholangiogram.The procedure has been discussed with the patient. Operative and non operative treatments have been discussed. Risks of surgery include bleeding, infection,  Common bile duct injury,  Injury to the stomach,liver, colon,small intestine, abdominal wall,  Diaphragm,  Major blood   vessels,  And the need for an open procedure.  Other risks include worsening of medical problems, death,  DVT and pulmonary embolism, and cardiovascular events.   Medical options have also been discussed. The patient has been informed of long term expectations of surgery and non surgical options,  The patient agrees to proceed.         Worth Kober A. 02/11/2011, 10:02 AM    

## 2011-02-11 NOTE — Patient Instructions (Signed)
Colecistectoma laparoscpica  (Laparoscopic Cholecystectomy)  La colecistectoma laparoscpica es una ciruga que se realiza para extirpar la vescula biliar. Est ubicada ligeramente a la derecha del centro del vientre (abdomen ), detrs del hgado. Almacena la bilis que se produce en el hgado. La bilis interviene en la digestin y absorcin de las grasas. La enfermedad de la vescula biliar (colecistitis) es la inflamacin de la vescula biliar. Esta enfermedad se debe a la acumulacin de piedras (colelitiasis). Estas piedras obstruyen el flujo de la bilis, lo que produce inflamacin y Engineer, mining. En los [employer], podr ser [place]. Cuando no es [place] operacin de [place], usted tendr tiempo de prepararse para el procedimiento. La ciruga laparoscpica es una alternativa a la ciruga abierta convencional, y normalmente requiere menos tiempo de Customer service manager. El conducto bliliar comn tambin debe examinarse y explorarse. El mdico comentar esto con usted si considera que debe [place]. Si se encuentran clculos en el conducto comn, debern extirparse. HGALE SABER A SU MDICO:   Alergias a alimentos o medicamentos.   Medicamentos que [place], incluyendo vitaminas, hierbas, gotas oftlmicas, medicamentos de venta libre y cremas.   Uso de corticoides (por va oral o cremas).   Problemas anteriores debido a anestsicos o a medicamentos que [employer] sensibilidad.   Antecedentes de hemorragias o cogulos sanguneos   Cirugas anteriores.   Otros problemas de salud, incluyendo diabetes y problemas renales.   Posibilidad de embarazo, si corresponde.  RIESGOS Y COMPLICACIONES  Todas las cirugas conllevan riesgos. Algunos problemas que pueden ocurrir luego de la intervencin son:   Infecciones.   Daos en el conducto biliar comn, nervios, arterias, venas u otros rganos internos como el [address] o los intestinos.   Hemorragias   Una piedra puede  quedar en el conducto biliar comn.  ANTES DEL PROCEDIMIENTO   No tome aspirina durante los [address] a la [place], ni anticoagulantes durante 1 semana antes de la [place].   No coma ni beba nada despus de la medianoche anterior a la ciruga.   Informe al profesional que lo asiste si se resfra o sufre algn problema infeccioso antes de la [place].   Deber presentarse 60 minutos antes del procedimiento, o segn se lo indiquen.  PROCEDIMIENTO Le administrarn un medicamento que lo har dormir (anestesia general). Una vez que est dormido, el mdico realizar varios cortes pequeos (incisin) en en el abdomen. Una de estas incisiones se utilizan para insertar un telescopio pequeo, con luz (laparoscopio) en el abdomen. El laparoscopio le permite al cirujano observar el interior del abdomen. Le insertarn gas dixido de carbono en el abdomen. Esto tambin dar lugar al cirujano para Retail buyer. Otros instrumentos quirrgicos utilizados para llevar a cabo la ciruga se insertarn a travs de las otras incisiones.  La laparoscopia puede no ser apropiada cuando:  Hay una cicatriz importante de una operacin anterior.   La vescula est muy inflamada.   Hay trastornos hemorrgios o cirrosis en el hgado.   Si tiene Chartered loss adjuster de trmino.   Existen otras enfermedades que imposibilitan el procedimiento laparoscpico.  El cirujano determina que no es posible continuar con el procedimiento laparoscpico. Si esto sucede, realizar una ciruga abdominal abierta. En este caso, el cirujano realizar una incisin para ingresar al abdomen. Esto le dar Neomia Dear mayor visin y campo para trabajar en l. Le permitir al Nicholes Mango realizar procedimientos que a veces no pueden realizarse con un laparoscopio. Este tipo de [place] requiere un tiempo ms prolongado de Customer service manager.  DESPUS DEL PROCEDIMIENTO   Despus de la ciruga lo llevarn al rea de recuperacin donde un(a) enfermero(a) lo  cuidar y Chief Operating Officer su evolucin.   Podr volver a Sales promotion account executive.   No reanude la actividad fsica hasta que se lo indique el mdico.   Puede reanudar su dieta y sus actividades normales segn se le haya indicado.  Document Released: 01/14/2005 Document Revised: 09/26/2010 [facility] Patient Information 2012 [place], [place].

## 2011-02-15 ENCOUNTER — Encounter (HOSPITAL_COMMUNITY): Payer: Self-pay | Admitting: Respiratory Therapy

## 2011-02-22 ENCOUNTER — Encounter (HOSPITAL_COMMUNITY)
Admission: RE | Admit: 2011-02-22 | Discharge: 2011-02-22 | Disposition: A | Discharge status: Home / Self Care | Payer: Medicaid Other | Source: Ambulatory Visit | Attending: Surgery | Admitting: Surgery

## 2011-02-22 ENCOUNTER — Ambulatory Visit (HOSPITAL_COMMUNITY)
Admission: RE | Admit: 2011-02-22 | Discharge: 2011-02-22 | Disposition: A | Discharge status: Home / Self Care | Payer: Medicaid Other | Source: Ambulatory Visit | Attending: Surgery | Admitting: Surgery

## 2011-02-22 ENCOUNTER — Encounter (HOSPITAL_COMMUNITY): Payer: Self-pay

## 2011-02-22 DIAGNOSIS — Z01818 Encounter for other preprocedural examination: Secondary | ICD-10-CM | POA: Insufficient documentation

## 2011-02-22 DIAGNOSIS — Z01812 Encounter for preprocedural laboratory examination: Secondary | ICD-10-CM | POA: Insufficient documentation

## 2011-02-22 HISTORY — DX: Unspecified convulsions: R56.9

## 2011-02-22 HISTORY — DX: Anemia, unspecified: D64.9

## 2011-02-22 HISTORY — DX: Cardiac arrhythmia, unspecified: I49.9

## 2011-02-22 HISTORY — DX: Depression, unspecified: F32.A

## 2011-02-22 HISTORY — DX: Major depressive disorder, single episode, unspecified: F32.9

## 2011-02-22 HISTORY — DX: Encounter for other specified aftercare: Z51.89

## 2011-02-22 HISTORY — DX: Essential (primary) hypertension: I10

## 2011-02-22 HISTORY — DX: Reserved for inherently not codable concepts without codable children: IMO0001

## 2011-02-22 HISTORY — DX: Headache: R51

## 2011-02-22 LAB — CBC
MCV: 95.3 fL (ref 78.0–100.0)
Platelets: 317 10*3/uL (ref 150–400)
RBC: 4.47 MIL/uL (ref 3.87–5.11)
RDW: 13.6 % (ref 11.5–15.5)
WBC: 13.8 10*3/uL — ABNORMAL HIGH (ref 4.0–10.5)

## 2011-02-22 LAB — DIFFERENTIAL
Basophils Absolute: 0 10*3/uL (ref 0.0–0.1)
Basophils Relative: 0 % (ref 0–1)
Eosinophils Absolute: 0 10*3/uL (ref 0.0–0.7)
Eosinophils Relative: 0 % (ref 0–5)
Monocytes Absolute: 1.1 10*3/uL — ABNORMAL HIGH (ref 0.1–1.0)
Monocytes Relative: 8 % (ref 3–12)
Neutro Abs: 11.4 10*3/uL — ABNORMAL HIGH (ref 1.7–7.7)

## 2011-02-22 LAB — COMPREHENSIVE METABOLIC PANEL
ALT: 13 U/L (ref 0–35)
AST: 14 U/L (ref 0–37)
Albumin: 4.3 g/dL (ref 3.5–5.2)
Alkaline Phosphatase: 55 U/L (ref 39–117)
CO2: 26 mEq/L (ref 19–32)
Chloride: 101 mEq/L (ref 96–112)
GFR calc non Af Amer: >90 mL/min (ref >90)
Potassium: 3.8 mEq/L (ref 3.5–5.1)
Sodium: 137 mEq/L (ref 135–145)
Total Bilirubin: 0.6 mg/dL (ref 0.3–1.2)

## 2011-02-22 NOTE — Progress Notes (Signed)
Contacted Dr. Knox Royalty office, spoke with [facility], requested last office visit note and  EKG.  Patient states had surgery in [place] several years ago and had" irregular heart beats" after surgery. States was told to follow up with doctor for her heart.  Patient states has not seen a cardiologist and primary doctor not aware of this.

## 2011-02-22 NOTE — Progress Notes (Signed)
Forwarded to anesthesia for review. 

## 2011-02-22 NOTE — Pre-Procedure Instructions (Signed)
20 Britanee Snider  02/22/2011   Your procedure is scheduled on:  Thursday February 28, 2011  Report to [facility] Short Stay Center at 0530 AM.  Call this number if you have problems the morning of surgery: [phone]   Remember:   Do not eat food:After Midnight.  May have clear liquids: up to 4 Hours before arrival. (up to 1:30am)  Clear liquids include soda, tea, black coffee, apple or grape juice, broth.  Take these medicines the morning of surgery with A SIP OF WATER: cymbalta, omeprazole, tramadol,    Do not wear jewelry, make-up or nail polish.  Do not wear lotions, powders, or perfumes. You may wear deodorant.  Do not shave 48 hours prior to surgery.  Do not bring valuables to the hospital.  Contacts, dentures or bridgework may not be worn into surgery.  Leave suitcase in the car. After surgery it may be brought to your room.  For patients admitted to the hospital, checkout time is 11:00 AM the day of discharge.   Patients discharged the day of surgery will not be allowed to drive home.  Name and phone number of your driver: husband  Special Instructions: CHG Shower Use Special Wash: 1/2 bottle night before surgery and 1/2 bottle morning of surgery.   Please read over the following fact sheets that you were given: Pain Booklet, Coughing and Deep Breathing, MRSA Information and Surgical Site Infection Prevention
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
## 2011-02-27 MED ORDER — CEFAZOLIN SODIUM 1-5 GM-% IV SOLN
1.0000 g | INTRAVENOUS | Status: AC
  Administered 2011-02-28: 1 g via INTRAVENOUS
  Filled 2011-02-27: qty 50

## 2011-02-28 ENCOUNTER — Other Ambulatory Visit (INDEPENDENT_AMBULATORY_CARE_PROVIDER_SITE_OTHER): Payer: Self-pay | Admitting: Surgery

## 2011-02-28 ENCOUNTER — Encounter (HOSPITAL_COMMUNITY): Payer: Self-pay | Admitting: *Deleted

## 2011-02-28 ENCOUNTER — Encounter (HOSPITAL_COMMUNITY)
Admission: RE | Disposition: A | Discharge status: Home / Self Care | Payer: Self-pay | Source: Ambulatory Visit | Attending: Surgery

## 2011-02-28 ENCOUNTER — Ambulatory Visit (HOSPITAL_COMMUNITY)
Admission: RE | Admit: 2011-02-28 | Discharge: 2011-02-28 | Disposition: A | Discharge status: Home / Self Care | Payer: Medicaid Other | Source: Ambulatory Visit | Attending: Surgery | Admitting: Surgery

## 2011-02-28 ENCOUNTER — Encounter (HOSPITAL_COMMUNITY): Payer: Self-pay | Admitting: Vascular Surgery

## 2011-02-28 ENCOUNTER — Ambulatory Visit (HOSPITAL_COMMUNITY): Payer: Medicaid Other | Admitting: Vascular Surgery

## 2011-02-28 DIAGNOSIS — F3289 Other specified depressive episodes: Secondary | ICD-10-CM | POA: Insufficient documentation

## 2011-02-28 DIAGNOSIS — I10 Essential (primary) hypertension: Secondary | ICD-10-CM | POA: Insufficient documentation

## 2011-02-28 DIAGNOSIS — K828 Other specified diseases of gallbladder: Secondary | ICD-10-CM

## 2011-02-28 DIAGNOSIS — F329 Major depressive disorder, single episode, unspecified: Secondary | ICD-10-CM | POA: Insufficient documentation

## 2011-02-28 HISTORY — PX: CHOLECYSTECTOMY: SHX55

## 2011-02-28 SURGERY — LAPAROSCOPIC CHOLECYSTECTOMY
Anesthesia: General | Site: Abdomen | Wound class: Contaminated

## 2011-02-28 MED ORDER — HYDROMORPHONE HCL PF 1 MG/ML IJ SOLN
0.2500 mg | INTRAMUSCULAR | Status: AC | PRN
  Administered 2011-02-28 (×8): 0.5 mg via INTRAVENOUS

## 2011-02-28 MED ORDER — MIDAZOLAM HCL 5 MG/5ML IJ SOLN
INTRAMUSCULAR | Status: DC | PRN
  Administered 2011-02-28: 2 mg via INTRAVENOUS

## 2011-02-28 MED ORDER — PROPOFOL 10 MG/ML IV EMUL
INTRAVENOUS | Status: DC | PRN
  Administered 2011-02-28: 200 mg via INTRAVENOUS

## 2011-02-28 MED ORDER — BUPIVACAINE-EPINEPHRINE 0.25% -1:200000 IJ SOLN
INTRAMUSCULAR | Status: DC | PRN
  Administered 2011-02-28: 10 mL

## 2011-02-28 MED ORDER — PHENYLEPHRINE HCL 10 MG/ML IJ SOLN
INTRAMUSCULAR | Status: DC | PRN
  Administered 2011-02-28 (×2): 80 ug via INTRAVENOUS

## 2011-02-28 MED ORDER — 0.9 % SODIUM CHLORIDE (POUR BTL) OPTIME
TOPICAL | Status: DC | PRN
  Administered 2011-02-28: 1000 mL

## 2011-02-28 MED ORDER — IOHEXOL 300 MG/ML  SOLN: INTRAMUSCULAR | Status: DC | PRN

## 2011-02-28 MED ORDER — OXYCODONE HCL 5 MG PO TABS: 5.0000 mg | ORAL_TABLET | ORAL | Status: DC | PRN

## 2011-02-28 MED ORDER — SODIUM CHLORIDE 0.9 % IJ SOLN: 3.0000 mL | INTRAMUSCULAR | Status: DC | PRN

## 2011-02-28 MED ORDER — OXYCODONE-ACETAMINOPHEN 5-325 MG PO TABS: 1.0000 | ORAL_TABLET | ORAL | Status: AC | PRN

## 2011-02-28 MED ORDER — NEOSTIGMINE METHYLSULFATE 1 MG/ML IJ SOLN
INTRAMUSCULAR | Status: DC | PRN
  Administered 2011-02-28: 5 mg via INTRAVENOUS

## 2011-02-28 MED ORDER — HYDROMORPHONE HCL PF 1 MG/ML IJ SOLN
INTRAMUSCULAR | Status: AC
  Filled 2011-02-28: qty 1

## 2011-02-28 MED ORDER — FENTANYL CITRATE 0.05 MG/ML IJ SOLN
INTRAMUSCULAR | Status: DC | PRN
  Administered 2011-02-28: 50 ug via INTRAVENOUS
  Administered 2011-02-28: 100 ug via INTRAVENOUS
  Administered 2011-02-28 (×2): 50 ug via INTRAVENOUS

## 2011-02-28 MED ORDER — OXYCODONE-ACETAMINOPHEN 5-325 MG PO TABS
ORAL_TABLET | ORAL | Status: AC
  Administered 2011-02-28: 2
  Filled 2011-02-28: qty 2

## 2011-02-28 MED ORDER — ACETAMINOPHEN 325 MG PO TABS: 650.0000 mg | ORAL_TABLET | ORAL | Status: DC | PRN

## 2011-02-28 MED ORDER — MORPHINE SULFATE 2 MG/ML IJ SOLN: 1.0000 mg | INTRAMUSCULAR | Status: DC | PRN

## 2011-02-28 MED ORDER — DROPERIDOL 2.5 MG/ML IJ SOLN: 0.6250 mg | INTRAMUSCULAR | Status: DC | PRN

## 2011-02-28 MED ORDER — LIDOCAINE HCL (CARDIAC) 20 MG/ML IV SOLN
INTRAVENOUS | Status: DC | PRN
  Administered 2011-02-28: 100 mg via INTRAVENOUS

## 2011-02-28 MED ORDER — ONDANSETRON HCL 4 MG/2ML IJ SOLN
INTRAMUSCULAR | Status: DC | PRN
  Administered 2011-02-28 (×2): 4 mg via INTRAVENOUS

## 2011-02-28 MED ORDER — ROCURONIUM BROMIDE 100 MG/10ML IV SOLN
INTRAVENOUS | Status: DC | PRN
  Administered 2011-02-28: 40 mg via INTRAVENOUS

## 2011-02-28 MED ORDER — KETOROLAC TROMETHAMINE 60 MG/2ML IM SOLN
INTRAMUSCULAR | Status: AC
  Filled 2011-02-28: qty 2

## 2011-02-28 MED ORDER — ACETAMINOPHEN 650 MG RE SUPP: 650.0000 mg | RECTAL | Status: DC | PRN

## 2011-02-28 MED ORDER — LACTATED RINGERS IV SOLN
INTRAVENOUS | Status: DC | PRN
  Administered 2011-02-28 (×2): via INTRAVENOUS

## 2011-02-28 MED ORDER — HYDROMORPHONE HCL PF 1 MG/ML IJ SOLN
1.0000 mg | INTRAMUSCULAR | Status: AC
  Administered 2011-02-28: 1 mg via INTRAVENOUS

## 2011-02-28 MED ORDER — SODIUM CHLORIDE 0.9 % IR SOLN
Status: DC | PRN
  Administered 2011-02-28: 1000 mL

## 2011-02-28 MED ORDER — GLYCOPYRROLATE 0.2 MG/ML IJ SOLN
INTRAMUSCULAR | Status: DC | PRN
  Administered 2011-02-28: .6 mg via INTRAVENOUS

## 2011-02-28 MED ORDER — ONDANSETRON HCL 4 MG/2ML IJ SOLN: 4.0000 mg | Freq: Four times a day (QID) | INTRAMUSCULAR | Status: DC | PRN

## 2011-02-28 MED ORDER — SODIUM CHLORIDE 0.9 % IJ SOLN: 3.0000 mL | Freq: Two times a day (BID) | INTRAMUSCULAR | Status: DC

## 2011-02-28 MED ORDER — PROMETHAZINE HCL 25 MG/ML IJ SOLN: 12.5000 mg | Freq: Four times a day (QID) | INTRAMUSCULAR | Status: DC | PRN

## 2011-02-28 MED ORDER — SODIUM CHLORIDE 0.9 % IV SOLN: 250.0000 mL | INTRAVENOUS | Status: DC | PRN

## 2011-02-28 SURGICAL SUPPLY — 41 items
APPLIER CLIP ROT 10 11.4 M/L (STAPLE) ×3
BLADE SURG ROTATE 9660 (MISCELLANEOUS) IMPLANT
CANISTER SUCTION 2500CC (MISCELLANEOUS) ×3 IMPLANT
CHLORAPREP W/TINT 26ML (MISCELLANEOUS) ×3 IMPLANT
CLIP APPLIE ROT 10 11.4 M/L (STAPLE) ×2 IMPLANT
CLOTH BEACON ORANGE TIMEOUT ST (SAFETY) ×3 IMPLANT
COVER MAYO STAND STRL (DRAPES) ×3 IMPLANT
COVER SURGICAL LIGHT HANDLE (MISCELLANEOUS) ×3 IMPLANT
DECANTER SPIKE VIAL GLASS SM (MISCELLANEOUS) ×6 IMPLANT
DERMABOND ADVANCED (GAUZE/BANDAGES/DRESSINGS) ×1
DERMABOND ADVANCED .7 DNX12 (GAUZE/BANDAGES/DRESSINGS) ×2 IMPLANT
DRAPE C-ARM 42X72 X-RAY (DRAPES) ×3 IMPLANT
DRAPE UTILITY 15X26 W/TAPE STR (DRAPE) ×6 IMPLANT
DRAPE WARM FLUID 44X44 (DRAPE) ×3 IMPLANT
ELECT REM PT RETURN 9FT ADLT (ELECTROSURGICAL) ×3
ELECTRODE REM PT RTRN 9FT ADLT (ELECTROSURGICAL) ×2 IMPLANT
GLOVE BIOGEL PI IND STRL 6.5 (GLOVE) ×4 IMPLANT
GLOVE BIOGEL PI IND STRL 8 (GLOVE) ×2 IMPLANT
GLOVE BIOGEL PI INDICATOR 6.5 (GLOVE) ×2
GLOVE BIOGEL PI INDICATOR 8 (GLOVE) ×1
GLOVE SURG SS PI 6.5 STRL IVOR (GLOVE) ×6 IMPLANT
GLOVE SURG SS PI 7.5 STRL IVOR (GLOVE) ×3 IMPLANT
GLOVE SURG SS PI 8.0 STRL IVOR (GLOVE) ×3 IMPLANT
GOWN STRL NON-REIN LRG LVL3 (GOWN DISPOSABLE) ×9 IMPLANT
KIT BASIN OR (CUSTOM PROCEDURE TRAY) ×3 IMPLANT
KIT ROOM TURNOVER OR (KITS) ×3 IMPLANT
NS IRRIG 1000ML POUR BTL (IV SOLUTION) ×3 IMPLANT
PAD ARMBOARD 7.5X6 YLW CONV (MISCELLANEOUS) ×3 IMPLANT
POUCH SPECIMEN RETRIEVAL 10MM (ENDOMECHANICALS) ×3 IMPLANT
SCISSORS LAP 5X35 DISP (ENDOMECHANICALS) IMPLANT
SET CHOLANGIOGRAPH 5 50 .035 (SET/KITS/TRAYS/PACK) ×3 IMPLANT
SET IRRIG TUBING LAPAROSCOPIC (IRRIGATION / IRRIGATOR) ×3 IMPLANT
SLEEVE ENDOPATH XCEL 5M (ENDOMECHANICALS) ×3 IMPLANT
SPECIMEN JAR SMALL (MISCELLANEOUS) ×3 IMPLANT
SUT MNCRL AB 4-0 PS2 18 (SUTURE) ×6 IMPLANT
TOWEL OR 17X24 6PK STRL BLUE (TOWEL DISPOSABLE) ×3 IMPLANT
TOWEL OR 17X26 10 PK STRL BLUE (TOWEL DISPOSABLE) ×3 IMPLANT
TRAY LAPAROSCOPIC (CUSTOM PROCEDURE TRAY) ×3 IMPLANT
TROCAR XCEL BLUNT TIP 100MML (ENDOMECHANICALS) ×3 IMPLANT
TROCAR XCEL NON-BLD 11X100MML (ENDOMECHANICALS) ×3 IMPLANT
TROCAR XCEL NON-BLD 5MMX100MML (ENDOMECHANICALS) ×3 IMPLANT

## 2011-02-28 NOTE — Progress Notes (Signed)
Report received 

## 2011-02-28 NOTE — H&P (View-Only) (Signed)
Patient ID: [name], female   DOB: 01/04/1976, 41 y.o.   MRN: [id number]  Chief Complaint  Patient presents with  . Other    new pt- eval biliary dysk    HPI [name] is a 41 y.o. female.  The patient presents at the request of Dr. Audley Hose due to abdominal pain. She has a six-month history of right upper quadrant abdominal pain which radiates to her back. It has become more severe over the last month. Food makes it worse and her causes nausea and vomiting. The pain is moderate. She has lost weight. HIDA study was done which showed ejection fraction of 80% and pain with CCK. CT scan of abdomen from November 2012 showed no abnormality. Ultrasound was done in 2009 which showed no gallstones for similar complaints.        HPI  History reviewed. No pertinent past medical history.  Past Surgical History  Procedure Date  . Abdominal hysterectomy     Family History  Problem Relation Age of Onset  . Cancer Mother     colon, skin  . Heart disease Father     Social History History  Substance Use Topics  . Smoking status: Never Smoker   . Smokeless tobacco: Not on file  . Alcohol Use: No    Allergies  Allergen Reactions  . Latex Anaphylaxis  . Naproxen Anaphylaxis      Review of Systems Review of Systems  Constitutional: Positive for appetite change and unexpected weight change.  HENT: Negative.   Eyes: Negative.   Respiratory: Negative.   Cardiovascular: Negative.   Gastrointestinal: Positive for nausea, vomiting and abdominal pain.  Genitourinary: Negative.   Musculoskeletal: Negative.   Neurological: Negative.   Hematological: Negative.   Psychiatric/Behavioral: Negative.     Blood pressure 132/98, pulse 80, temperature 97.6 F (36.4 C), temperature source Temporal, resp. rate 18, height 5\' 8"  (1.727 m), weight 171 lb 6.4 oz (77.747 kg).  Physical Exam Physical Exam  Constitutional: She is oriented to person, place, and time. She appears  well-developed and well-nourished.  HENT:  Head: Normocephalic and atraumatic.  Eyes: EOM are normal.  Neck: Normal range of motion. Neck supple.  Cardiovascular: Normal rate and regular rhythm.   Pulmonary/Chest: Effort normal and breath sounds normal.  Abdominal: There is tenderness.       RUQ  Musculoskeletal: Normal range of motion.  Neurological: She is alert and oriented to person, place, and time.  Skin: Skin is warm and dry.  Psychiatric: She has a normal mood and affect. Thought content normal.    Data Reviewed HIDA 80 %  CCK PAIN CT ABDOMEN  Normal  Nov 2012  Assessment    Biliary dyskinesia.    Plan    I discussed options of treatment for her condition and success rates. Surgery is successful at relieving symptoms of biliary dyskinesia 50% to 60% of the time. She does have symptoms consistent with biliary colic. CT scan done in November shows no other abnormality. Pain with CCK administration is the most reliable predicting factor for success of cholecystectomy in this setting. I used a Nurse, learning disability since ring which is somewhat broken to make sure we understand each other. She voiced understanding and wished to proceed with laparoscopic cholecystectomy and cholangiogram.The procedure has been discussed with the patient. Operative and non operative treatments have been discussed. Risks of surgery include bleeding, infection,  Common bile duct injury,  Injury to the stomach,liver, colon,small intestine, abdominal wall,  Diaphragm,  Major blood  vessels,  And the need for an open procedure.  Other risks include worsening of medical problems, death,  DVT and pulmonary embolism, and cardiovascular events.   Medical options have also been discussed. The patient has been informed of long term expectations of surgery and non surgical options,  The patient agrees to proceed.         [name] A. 02/11/2011, 10:02 AM

## 2011-02-28 NOTE — Preoperative (Signed)
Beta Blockers   Reason not to administer Beta Blockers:Not Applicable 

## 2011-02-28 NOTE — Progress Notes (Signed)
Pt voided small amount in commode. States that she still feels "full". Lower abdomen slightly firm. Umbilical incision site draining small amount of sanguinous drainage. Called Dr. Rosezena Sensor office and reported both of the above to nurse. Nurse to page Dr. Luisa Hart and have him call Short Stay

## 2011-02-28 NOTE — Progress Notes (Signed)
Spoke with Dr. Luisa Hart regarding patient pain remains 8/10; orders received

## 2011-02-28 NOTE — Anesthesia Procedure Notes (Signed)
Procedure Name: Intubation Date/Time: 02/28/2011 7:35 AM Performed by: Malachi Pro Pre-anesthesia Checklist: Patient identified, Emergency Drugs available, Suction available and Patient being monitored Patient Re-evaluated:Patient Re-evaluated prior to inductionOxygen Delivery Method: Circle System Utilized Preoxygenation: Pre-oxygenation with 100% oxygen Intubation Type: IV induction Ventilation: Mask ventilation without difficulty Laryngoscope Size: Mac and 3 Grade View: Grade I Tube size: 7.5 mm Number of attempts: 1 Airway Equipment and Method: stylet Placement Confirmation: ETT inserted through vocal cords under direct vision,  positive ETCO2 and breath sounds checked- equal and bilateral Secured at: 23 cm Tube secured with: Tape Dental Injury: Teeth and Oropharynx as per pre-operative assessment

## 2011-02-28 NOTE — Transfer of Care (Signed)
Immediate Anesthesia Transfer of Care Note  Patient: [name]  Procedure(s) Performed:  LAPAROSCOPIC CHOLECYSTECTOMY - Laparoscopic cholecystectomy.  Patient Location: PACU  Anesthesia Type: General  Level of Consciousness: responds to stimulation  Airway & Oxygen Therapy: Patient Spontanous Breathing and Patient connected to face mask  Post-op Assessment: Report given to PACU RN and Post -op Vital signs reviewed and stable  Post vital signs: Reviewed and stable  Complications: No apparent anesthesia complications

## 2011-02-28 NOTE — Interval H&P Note (Signed)
History and Physical Interval Note:  02/28/2011 7:12 AM  [name]  has presented today for surgery, with the diagnosis of Biliary dyskinesia.  The various methods of treatment have been discussed with the patient and family. After consideration of risks, benefits and other options for treatment, the patient has consented to  Procedure(s): LAPAROSCOPIC CHOLECYSTECTOMY WITH INTRAOPERATIVE CHOLANGIOGRAM as a surgical intervention .  The patients' history has been reviewed, patient examined, no change in status, stable for surgery.  I have reviewed the patients' chart and labs.  Questions were answered to the patient's satisfaction.     Kenwood Rosiak A.

## 2011-02-28 NOTE — Anesthesia Preprocedure Evaluation (Addendum)
Anesthesia Evaluation  Patient identified by MRN, date of birth, ID band Patient awake    Reviewed: Allergy & Precautions, H&P , NPO status , Patient's Chart, lab work & pertinent test results  Airway Mallampati: II TM Distance: >3 FB Neck ROM: Full    Dental  (+) Dental Advisory Given   Pulmonary          Cardiovascular hypertension, + dysrhythmias Supra Ventricular Tachycardia     Neuro/Psych  Headaches, Seizures -, Well Controlled,  Depression    GI/Hepatic   Endo/Other    Renal/GU      Musculoskeletal   Abdominal   Peds  Hematology   Anesthesia Other Findings   Reproductive/Obstetrics                          Anesthesia Physical Anesthesia Plan  ASA: II  Anesthesia Plan: General   Post-op Pain Management:    Induction: Intravenous  Airway Management Planned: Oral ETT  Additional Equipment:   Intra-op Plan:   Post-operative Plan: Extubation in OR  Informed Consent:   Dental advisory given  Plan Discussed with: Anesthesiologist  Anesthesia Plan Comments:         Anesthesia Quick Evaluation

## 2011-02-28 NOTE — Op Note (Signed)
Preop diagnosis: Biliary dyskinesia  Postop diagnosis: Same  Procedure: Laparoscopic cholecystectomy  Surgeon: Harriette Bouillon M.D.  Anesthesia: Gen. endotracheal anesthesia with 0.25% bupivacaine local with epinephrine  EBL: 10 cc  Specimen: Gallbladder  Drains: None  IV fluids: 3000 cc crystalloid  Indications for procedure: The patient is a 41 year old female with right upper quadrant pain and otherwise negative workup. She underwent a HIDA study which showed a normal ejection fraction but pain with CCK infusion. I discussed treatment options of observation versus further medical workup versus cholecystectomy. The benefit of cholecystectomy in the setting is 50% at relieving symptoms. I discussed the complications of cholecystectomy which are outlined in my history and physical. She was to proceed with cholecystectomy.  Description of procedure:  The patient was met in the holding area and questions are answered. She was taken back to the operating room and placed supine on the operating room table. After induction of general anesthesia, the abdomen was prepped and draped in a sterile fashion and she received 1 g of Ancef. Timeout was done. A 1 cm infraumbilical incision was made and dissection was carried down to her fashion. She had a previous abdominoplasty and scar tissue. I was able to open the fascia and into the abdominal cavity without difficulty. A pursestring suture of 0 Vicryl was placed and a 12 mm Hassan cannula was placed under direct vision. Pneumoperitoneum was created to 15 mmHg of pressure and laparoscopy was performed. 4 quadrant laparoscopy revealed no significant abnormality or bowel injury. 11 mm subxiphoid port was placed under direct vision. 2  5 mm ports are placed in the right upper quadrant. The gallbladder was identified and grabbed by its dome. This was retracted toward the patient's right shoulder and a second grasper was used to grab the infundibulum and retracted  toward the right lower quadrant. The cystic duct was identified and dissected out and the critical view was achieved. 3 clips are placed on the proximal cystic duct and 2 clips are placed on the gallbladder side of this and it was divided. The cystic artery was divided between clips. The gallbladder was removed the gallbladder bed with cautery. The bed was dry with no signs of bleeding or bile leakage. The gallbladder was placed in an Endo Catch bag and extracted. Here gauge was used and suctioned out until clear. 4 quadrant laparoscopy revealed no injury. The ports were removed with no signs of port site bleeding. This is CO2 was allowed to escape. The skin was closed with 4-0 Monocryl. Umbilical port site closed with 0 Vicryl. Dermabond applied. All counts found to be correct. The patient was awoke taken to recovery in satisfactory condition.

## 2011-02-28 NOTE — Anesthesia Postprocedure Evaluation (Signed)
Anesthesia Post Note  Patient: [name]  Procedure(s) Performed:  LAPAROSCOPIC CHOLECYSTECTOMY - Laparoscopic cholecystectomy.  Anesthesia type: general  Patient location: PACU  Post pain: Pain level controlled  Post assessment: Patient's Cardiovascular Status Stable  Last Vitals:  Filed Vitals:   02/28/11 0945  BP: 132/74  Pulse: 69  Temp:   Resp: 16    Post vital signs: Reviewed and stable  Level of consciousness: sedated  Complications: No apparent anesthesia complications

## 2011-03-01 ENCOUNTER — Encounter (HOSPITAL_COMMUNITY): Payer: Self-pay | Admitting: Surgery

## 2011-03-01 MED FILL — Ketorolac Tromethamine IM Inj 60 MG/2ML (30 MG/ML): INTRAMUSCULAR | Qty: 2 | Status: AC

## 2011-03-07 ENCOUNTER — Telehealth (INDEPENDENT_AMBULATORY_CARE_PROVIDER_SITE_OTHER): Payer: Self-pay | Admitting: Surgery

## 2011-03-07 NOTE — Telephone Encounter (Signed)
Spoke to patient, she states that she feels nauseas  when eating, no vomitting, chills, rt side of abd a little swollen, significant amount of pain, feels hot on the inside, this morning woke up with blisters around her mouth.

## 2011-03-07 NOTE — Telephone Encounter (Signed)
Patient called in, she had sx for biliary dyskinesia on 02/28/11, she says her stool is too white, she want to know if that's normal?  (code 4)

## 2011-03-08 ENCOUNTER — Encounter (INDEPENDENT_AMBULATORY_CARE_PROVIDER_SITE_OTHER): Payer: Self-pay | Admitting: Surgery

## 2011-03-08 ENCOUNTER — Other Ambulatory Visit (INDEPENDENT_AMBULATORY_CARE_PROVIDER_SITE_OTHER): Payer: Self-pay | Admitting: Surgery

## 2011-03-08 ENCOUNTER — Telehealth (INDEPENDENT_AMBULATORY_CARE_PROVIDER_SITE_OTHER): Payer: Self-pay | Admitting: General Surgery

## 2011-03-08 ENCOUNTER — Ambulatory Visit (INDEPENDENT_AMBULATORY_CARE_PROVIDER_SITE_OTHER): Payer: Medicaid Other | Admitting: Surgery

## 2011-03-08 VITALS — BP 138/88 | HR 108 | Temp 98.4°F | Resp 20 | Ht 68.0 in | Wt 165.0 lb

## 2011-03-08 DIAGNOSIS — R112 Nausea with vomiting, unspecified: Secondary | ICD-10-CM

## 2011-03-08 DIAGNOSIS — Z9889 Other specified postprocedural states: Secondary | ICD-10-CM

## 2011-03-08 LAB — CBC WITH DIFFERENTIAL/PLATELET
HCT: 47.7 % — ABNORMAL HIGH (ref 36.0–46.0)
Hemoglobin: 14.7 g/dL (ref 12.0–15.0)
MCH: 29.6 pg (ref 26.0–34.0)
MCHC: 30.8 g/dL (ref 30.0–36.0)
MCV: 96 fL (ref 78.0–100.0)
Monocytes Absolute: 0.5 10*3/uL (ref 0.1–1.0)
Monocytes Relative: 5 % (ref 3–12)
Neutro Abs: 7.4 10*3/uL (ref 1.7–7.7)
RDW: 12.2 % (ref 11.5–15.5)

## 2011-03-08 LAB — COMPREHENSIVE METABOLIC PANEL
ALT: 162 U/L — ABNORMAL HIGH (ref 0–35)
AST: 137 U/L — ABNORMAL HIGH (ref 0–37)
Albumin: 4.8 g/dL (ref 3.5–5.2)
Alkaline Phosphatase: 119 U/L — ABNORMAL HIGH (ref 39–117)
BUN: 10 mg/dL (ref 6–23)
Calcium: 10 mg/dL (ref 8.4–10.5)
Chloride: 100 mEq/L (ref 96–112)
Potassium: 4.2 mEq/L (ref 3.5–5.3)

## 2011-03-08 MED ORDER — PROMETHAZINE HCL 12.5 MG PO TABS: 12.5000 mg | ORAL_TABLET | Freq: Four times a day (QID) | ORAL | Status: DC | PRN

## 2011-03-08 MED ORDER — HYDROCODONE-ACETAMINOPHEN 5-500 MG PO TABS: 1.0000 | ORAL_TABLET | ORAL | Status: DC | PRN

## 2011-03-08 MED ORDER — CHOLESTYRAMINE 4 G PO PACK: 1.0000 | PACK | Freq: Three times a day (TID) | ORAL | Status: DC

## 2011-03-08 NOTE — Telephone Encounter (Signed)
I RECEIVED S LAB RESULTS FROM THIS AM AND REPORTED THEM TO DR. WAKEFIELD/ I THEN CALLED PT AND SPOKE WITH HER HUSBAND JUAN TO CHECK ON HER CONDITION/ HE SAID SHE FEELS BETTER THEN THIS AM/ NO FEVER OR ABDOMINAL DISTENTION/ I INSTRUCTED HIM TO TAKE HER TO THE EMERGENCY ROOM THIS WEEKEND IF HER PAIN INCREASES AND IS NOT RELIEVED WITH MEDICATION OR IF SHE DEVELOPS A FEVER/ GY

## 2011-03-08 NOTE — Patient Instructions (Signed)
Liquid diet.  Will get blood drawn.  Will add nausea medicine and change pain medicine.  Return on Monday for recheck.  If symptoms worsen over the weekend seek care in emergency room.

## 2011-03-08 NOTE — Progress Notes (Signed)
The patient returns to clinic 8 days after laparoscopic cholecystectomy. Pathology showed chronic cholecystitis. She has persistent nausea and right upper quadrant abdominal pain. She is able to drink liquids and is making plenty of urine. She denies diarrhea. Denies any fever or chills.  Exam: Temperature is 98 pulse 108 blood pressure 138/80  Female in no apparent distress  Abdomen: Soft nondistended tender port sites but no peritonitis  Impression: Postoperative nausea vomiting diarrhea after laparoscopic cholecystectomy and abdominal pain  Plan: I will send her for a CBC, and liver function. Her examination does not support a bile leak and she is not jaundiced. I will decrease the strength of her pain medicine since this may be contributing to her symptoms and add phenergan for nausea.  I will change her to vicodin.  Will add cholestyramine for her loose stools and check a c diff.  Return on Monday for recheck.  If symptoms worsen,  Seek care in ED over the weekend.  May need further imaging if the medication change does not help.

## 2011-03-11 ENCOUNTER — Encounter (INDEPENDENT_AMBULATORY_CARE_PROVIDER_SITE_OTHER): Payer: Self-pay | Admitting: Surgery

## 2011-03-11 ENCOUNTER — Ambulatory Visit (INDEPENDENT_AMBULATORY_CARE_PROVIDER_SITE_OTHER): Payer: Medicaid Other | Admitting: Surgery

## 2011-03-11 VITALS — BP 110/80 | HR 72 | Temp 99.2°F | Resp 18 | Wt 168.5 lb

## 2011-03-11 DIAGNOSIS — Z9889 Other specified postprocedural states: Secondary | ICD-10-CM

## 2011-03-11 NOTE — Patient Instructions (Signed)
Follow up 1 week.

## 2011-03-11 NOTE — Progress Notes (Signed)
The patient returns today after being seen Friday due to diarrhea nausea and abdominal pain one week after laparoscopic cholecystectomy. Lab work was checked which showed a normal white count and a normal hemoglobin. Her bilirubin was one. Her on phosphatase was 119. Her AST and ALT were elevated into the low 100s. I placed her on cholestyramine and added Phenergan. She is feeling some better today.  Exam: Abdomen is soft and nontender. Incisions are clean dry and intact. She is not distended.  Impression: Status post laparoscopic cholecystectomy with post operative diarrhea, nausea and abdominal pain improved with cholestyramine  Plan: She looks much better today. I will see her back in one week to have her continue to cholestyramine and Phenergan is needed.

## 2011-03-17 ENCOUNTER — Emergency Department (HOSPITAL_COMMUNITY): Payer: Medicaid Other

## 2011-03-17 ENCOUNTER — Emergency Department (HOSPITAL_COMMUNITY)
Admission: EM | Admit: 2011-03-17 | Discharge: 2011-03-17 | Disposition: A | Discharge status: Home / Self Care | Payer: Medicaid Other | Attending: Emergency Medicine | Admitting: Emergency Medicine

## 2011-03-17 ENCOUNTER — Encounter (HOSPITAL_COMMUNITY): Payer: Self-pay | Admitting: Emergency Medicine

## 2011-03-17 DIAGNOSIS — R109 Unspecified abdominal pain: Secondary | ICD-10-CM | POA: Insufficient documentation

## 2011-03-17 DIAGNOSIS — F329 Major depressive disorder, single episode, unspecified: Secondary | ICD-10-CM | POA: Insufficient documentation

## 2011-03-17 DIAGNOSIS — R197 Diarrhea, unspecified: Secondary | ICD-10-CM | POA: Insufficient documentation

## 2011-03-17 DIAGNOSIS — K529 Noninfective gastroenteritis and colitis, unspecified: Secondary | ICD-10-CM

## 2011-03-17 DIAGNOSIS — F3289 Other specified depressive episodes: Secondary | ICD-10-CM | POA: Insufficient documentation

## 2011-03-17 DIAGNOSIS — Z79899 Other long term (current) drug therapy: Secondary | ICD-10-CM | POA: Insufficient documentation

## 2011-03-17 DIAGNOSIS — R112 Nausea with vomiting, unspecified: Secondary | ICD-10-CM | POA: Insufficient documentation

## 2011-03-17 DIAGNOSIS — I499 Cardiac arrhythmia, unspecified: Secondary | ICD-10-CM | POA: Insufficient documentation

## 2011-03-17 DIAGNOSIS — I10 Essential (primary) hypertension: Secondary | ICD-10-CM | POA: Insufficient documentation

## 2011-03-17 LAB — HEPATIC FUNCTION PANEL
Alkaline Phosphatase: 62 U/L (ref 39–117)
Bilirubin, Direct: <0.1 mg/dL (ref 0.0–0.3)
Total Bilirubin: 0.3 mg/dL (ref 0.3–1.2)

## 2011-03-17 LAB — DIFFERENTIAL
Basophils Absolute: 0.1 10*3/uL (ref 0.0–0.1)
Basophils Relative: 1 % (ref 0–1)
Eosinophils Absolute: 0 10*3/uL (ref 0.0–0.7)
Monocytes Absolute: 0.8 10*3/uL (ref 0.1–1.0)
Monocytes Relative: 10 % (ref 3–12)
Neutrophils Relative %: 62 % (ref 43–77)

## 2011-03-17 LAB — POCT I-STAT, CHEM 8
HCT: 43 % (ref 36.0–46.0)
Hemoglobin: 14.6 g/dL (ref 12.0–15.0)
Potassium: 5 mEq/L (ref 3.5–5.1)
Sodium: 138 mEq/L (ref 135–145)
TCO2: 26 mmol/L (ref 0–100)

## 2011-03-17 LAB — CBC
HCT: 40.2 % (ref 36.0–46.0)
Hemoglobin: 13.8 g/dL (ref 12.0–15.0)
MCH: 32.3 pg (ref 26.0–34.0)
MCHC: 34.3 g/dL (ref 30.0–36.0)
RDW: 13.1 % (ref 11.5–15.5)

## 2011-03-17 MED ORDER — CIPROFLOXACIN HCL 500 MG PO TABS: 500.0000 mg | ORAL_TABLET | Freq: Two times a day (BID) | ORAL | Status: AC

## 2011-03-17 MED ORDER — ONDANSETRON HCL 8 MG PO TABS: 8.0000 mg | ORAL_TABLET | Freq: Three times a day (TID) | ORAL | Status: AC | PRN

## 2011-03-17 MED ORDER — IOHEXOL 300 MG/ML  SOLN
100.0000 mL | Freq: Once | INTRAMUSCULAR | Status: AC | PRN
  Administered 2011-03-17: 100 mL via INTRAVENOUS

## 2011-03-17 MED ORDER — SODIUM CHLORIDE 0.9 % IV BOLUS (SEPSIS)
1000.0000 mL | Freq: Once | INTRAVENOUS | Status: AC
  Administered 2011-03-17: 1000 mL via INTRAVENOUS

## 2011-03-17 MED ORDER — OXYCODONE-ACETAMINOPHEN 5-325 MG PO TABS: 2.0000 | ORAL_TABLET | ORAL | Status: AC | PRN

## 2011-03-17 MED ORDER — HYDROMORPHONE HCL PF 1 MG/ML IJ SOLN
1.0000 mg | Freq: Once | INTRAMUSCULAR | Status: AC
  Administered 2011-03-17: 1 mg via INTRAVENOUS
  Filled 2011-03-17: qty 1

## 2011-03-17 MED ORDER — ONDANSETRON HCL 4 MG/2ML IJ SOLN
4.0000 mg | Freq: Once | INTRAMUSCULAR | Status: AC
  Administered 2011-03-17: 4 mg via INTRAVENOUS
  Filled 2011-03-17: qty 2

## 2011-03-17 MED ORDER — METRONIDAZOLE 500 MG PO TABS: 500.0000 mg | ORAL_TABLET | Freq: Three times a day (TID) | ORAL | Status: AC

## 2011-03-17 MED ORDER — ONDANSETRON 4 MG PO TBDP
8.0000 mg | ORAL_TABLET | Freq: Once | ORAL | Status: AC
  Administered 2011-03-17: 8 mg via ORAL
  Filled 2011-03-17: qty 2

## 2011-03-17 MED ORDER — METRONIDAZOLE 500 MG PO TABS
500.0000 mg | ORAL_TABLET | Freq: Once | ORAL | Status: AC
  Administered 2011-03-17: 500 mg via ORAL
  Filled 2011-03-17: qty 1

## 2011-03-17 MED ORDER — CIPROFLOXACIN HCL 500 MG PO TABS
500.0000 mg | ORAL_TABLET | Freq: Once | ORAL | Status: AC
  Administered 2011-03-17: 500 mg via ORAL
  Filled 2011-03-17: qty 1

## 2011-03-17 MED ORDER — METRONIDAZOLE IN NACL 5-0.79 MG/ML-% IV SOLN: 500.0000 mg | Freq: Once | INTRAVENOUS | Status: DC

## 2011-03-17 MED ORDER — HYDROMORPHONE HCL PF 1 MG/ML IJ SOLN
1.0000 mg | Freq: Once | INTRAMUSCULAR | Status: DC
  Filled 2011-03-17: qty 1

## 2011-03-17 MED ORDER — IOHEXOL 300 MG/ML  SOLN
20.0000 mL | INTRAMUSCULAR | Status: AC
  Administered 2011-03-17: 20 mL via ORAL

## 2011-03-17 MED ORDER — ONDANSETRON HCL 4 MG/2ML IJ SOLN
4.0000 mg | Freq: Once | INTRAMUSCULAR | Status: DC
  Filled 2011-03-17: qty 2

## 2011-03-17 MED ORDER — CIPROFLOXACIN IN D5W 400 MG/200ML IV SOLN: 400.0000 mg | Freq: Once | INTRAVENOUS | Status: DC

## 2011-03-17 MED ORDER — HYDROMORPHONE HCL PF 1 MG/ML IJ SOLN
1.0000 mg | Freq: Once | INTRAMUSCULAR | Status: AC
  Administered 2011-03-17: 1 mg via INTRAMUSCULAR
  Filled 2011-03-17: qty 1

## 2011-03-17 NOTE — ED Notes (Signed)
Patient resting while waiting for test results. NAD at this time.

## 2011-03-17 NOTE — ED Notes (Signed)
First meeting patient. Patient states she had gallbladder surgery x 2 weeks ago and reports normal pain after the surgery. Patient states at approx. Midnight last night she started with N/V and more than normal pain in her abdomen. Patient denies any other pain and is resting with NAD at this time.

## 2011-03-17 NOTE — ED Provider Notes (Signed)
8:48 AM The patient is at this time feeling better, but still with mild generalized abdominal discomfort.  She reports that she has developed diarrhea but without blood or mucous in the stool.  She is afebrile and without leukocytosis.  Her abdomen reveals mild tenderness generally in all quadrants, without rebound, guarding, or rigidity. No peritoneal signs. The CT scan was reviewed by me and shows a mild diffuse colitis. This does make sense and explains the patient's symptoms. I will treat her with antibiotics, anti-medics, analgesics as an outpatient and have referred her to followup with her primary care physician later this week for reevaluation, returning to the emergency department sooner if symptoms worsen. The patient states her understanding of and agreement with the plan of care.  Felisa Bonier, MD 03/17/11 [phone]

## 2011-03-17 NOTE — ED Notes (Addendum)
Patient states that she had surgery to have her gallbladder removed two weeks ago; patient states that she has had pain since the surgery, but that it became severe tonight around 0100.  Reports nausea, vomiting, and dizziness.

## 2011-03-17 NOTE — ED Notes (Signed)
Patient had gallbladder removed two weeks ago and states pain started approx 2 hours ago nausea and vomiting since surgery but worse now rates it 10/10 starts in abdomen and goes thru to the back. Guarding and tender on palpation.

## 2011-03-17 NOTE — ED Provider Notes (Signed)
History     CSN: [id number]  Arrival date & time 03/17/11  0134   First MD Initiated Contact with Patient 03/17/11 [phone]      Chief Complaint  Patient presents with  . Nausea & vomiting     (Consider location/radiation/quality/duration/timing/severity/associated sxs/prior treatment) HPI This is a 41 year old (she states her age of record is incorrect) Hispanic female who had a laparoscopic cholecystectomy 2 weeks ago. She had some postoperative discomfort for the last 3 days has felt good. She is here now with about 2-1/2 hours of severe epigastric and right upper quadrant pain radiating to her back. It has been associated with nausea, vomiting and lightheadedness. She denies diarrhea. She denies fever.  Past Medical History  Diagnosis Date  . Dysrhythmia     not followed by cardiogist  . Anemia   . Blood transfusion   . Seizures   . Headache   . Depression   . Hypertension     Dr. Knox Royalty [phone]  . Abdominal pain     takes omeprazole    Past Surgical History  Procedure Date  . Abdominal hysterectomy   . Cesarean section     x2  . Abdominoplasty   . Cervical spine surgery     2002  . Shoulder surgery     left humeral head replacement 2001  . Breast surgery     breast implants x 2  . Cholecystectomy 02/28/2011    Procedure: LAPAROSCOPIC CHOLECYSTECTOMY;  Surgeon: Clovis Pu. Cornett, MD;  Location: [facility] OR;  Service: General;  Laterality: N/A;  Laparoscopic cholecystectomy.    Family History  Problem Relation Age of Onset  . Cancer Mother     colon, skin  . Anesthesia problems Mother   . Heart disease Father     History  Substance Use Topics  . Smoking status: Never Smoker   . Smokeless tobacco: Not on file  . Alcohol Use: No    OB History    Grav Para Term Preterm Abortions TAB SAB Ect Mult Living                  Review of Systems  All other systems reviewed and are negative.    Allergies  Latex and Naproxen  Home Medications    Current Outpatient Rx  Name Route Sig Dispense Refill  . CHOLESTYRAMINE 4 G PO PACK Oral Take 1 packet by mouth 3 (three) times daily with meals. 60 each 2  . DICYCLOMINE HCL 20 MG PO TABS Oral Take 20 mg by mouth every 6 (six) hours.    . DULOXETINE HCL 60 MG PO CPEP Oral Take 60 mg by mouth daily.    Marland Kitchen HYDROCODONE-ACETAMINOPHEN 5-500 MG PO TABS Oral Take 1 tablet by mouth every 4 (four) hours as needed for pain. 20 tablet 0  . ADVIL PM PO Oral Take 2 tablets by mouth at bedtime as needed. For sleep     . LISINOPRIL-HYDROCHLOROTHIAZIDE 10-12.5 MG PO TABS Oral Take 1 tablet by mouth daily.    Marland Kitchen OMEPRAZOLE 40 MG PO CPDR Oral Take 40 mg by mouth 2 (two) times daily.      . TRAMADOL HCL 50 MG PO TABS Oral Take 50 mg by mouth 2 (two) times daily as needed. For pain Maximum dose= 8 tablets per day      BP 104/55  Pulse 83  Temp(Src) 97.9 F (36.6 C) (Oral)  Resp 16  SpO2 100%  Physical Exam General: Well-developed, well-nourished female in no  acute distress; appearance consistent with age of record HENT: normocephalic, atraumatic Eyes: pupils equal round and reactive to light; extraocular muscles intact Neck: supple Heart: regular rate and rhythm; no murmurs Lungs: clear to auscultation bilaterally Abdomen: soft; moderate to severe epigastric and right upper quadrant tenderness; nondistended; no masses or hepatosplenomegaly; bowel sounds present Extremities: No deformity; full range of motion; pulses normal Neurologic: Awake, alert and oriented; motor function intact in all extremities and symmetric; no facial droop Skin: Warm and dry     ED Course  Procedures (including critical care time)     MDM   Nursing notes and vitals signs, including pulse oximetry, reviewed.  Summary of this visit's results, reviewed by myself:  Labs:  Results for orders placed during the hospital encounter of 03/17/11  CBC      Component Value Range   WBC 8.0  4.0 - 10.5 (K/uL)   RBC 4.27   3.87 - 5.11 (MIL/uL)   Hemoglobin 13.8  12.0 - 15.0 (g/dL)   HCT 69.6  29.5 - 28.4 (%)   MCV 94.1  78.0 - 100.0 (fL)   MCH 32.3  26.0 - 34.0 (pg)   MCHC 34.3  30.0 - 36.0 (g/dL)   RDW 13.2  44.0 - 10.2 (%)   Platelets 296  150 - 400 (K/uL)  DIFFERENTIAL      Component Value Range   Neutrophils Relative 62  43 - 77 (%)   Neutro Abs 4.9  1.7 - 7.7 (K/uL)   Lymphocytes Relative 27  12 - 46 (%)   Lymphs Abs 2.2  0.7 - 4.0 (K/uL)   Monocytes Relative 10  3 - 12 (%)   Monocytes Absolute 0.8  0.1 - 1.0 (K/uL)   Eosinophils Relative 1  0 - 5 (%)   Eosinophils Absolute 0.0  0.0 - 0.7 (K/uL)   Basophils Relative 1  0 - 1 (%)   Basophils Absolute 0.1  0.0 - 0.1 (K/uL)  LIPASE, BLOOD      Component Value Range   Lipase 37  11 - 59 (U/L)  HEPATIC FUNCTION PANEL      Component Value Range   Total Protein 8.0  6.0 - 8.3 (g/dL)   Albumin 4.5  3.5 - 5.2 (g/dL)   AST 24  0 - 37 (U/L)   ALT 27  0 - 35 (U/L)   Alkaline Phosphatase 62  39 - 117 (U/L)   Total Bilirubin 0.3  0.3 - 1.2 (mg/dL)   Bilirubin, Direct <7.2  0.0 - 0.3 (mg/dL)   Indirect Bilirubin NOT CALCULATED  0.3 - 0.9 (mg/dL)  POCT I-STAT, CHEM 8      Component Value Range   Sodium 138  135 - 145 (mEq/L)   Potassium 5.0  3.5 - 5.1 (mEq/L)   Chloride 105  96 - 112 (mEq/L)   BUN 17  6 - 23 (mg/dL)   Creatinine, Ser 5.36  0.50 - 1.10 (mg/dL)   Glucose, Bld 84  70 - 99 (mg/dL)   Calcium, Ion 6.44  0.34 - 1.32 (mmol/L)   TCO2 26  0 - 100 (mmol/L)   Hemoglobin 14.6  12.0 - 15.0 (g/dL)   HCT 74.2  59.5 - 63.8 (%)    7:30AM Dr. Doylene Canard will review CT results and make disposition.           Hanley Seamen, MD 03/17/11 2256

## 2011-03-17 NOTE — ED Notes (Signed)
IV d/c;d for pt is getting dressed to be discharged home

## 2011-03-20 ENCOUNTER — Other Ambulatory Visit (INDEPENDENT_AMBULATORY_CARE_PROVIDER_SITE_OTHER): Payer: Self-pay | Admitting: Surgery

## 2011-03-20 DIAGNOSIS — R112 Nausea with vomiting, unspecified: Secondary | ICD-10-CM

## 2011-03-21 ENCOUNTER — Encounter (INDEPENDENT_AMBULATORY_CARE_PROVIDER_SITE_OTHER): Payer: Self-pay | Admitting: Surgery

## 2011-03-21 ENCOUNTER — Ambulatory Visit (INDEPENDENT_AMBULATORY_CARE_PROVIDER_SITE_OTHER): Payer: Medicaid Other | Admitting: Surgery

## 2011-03-21 VITALS — BP 132/90 | HR 76 | Temp 99.1°F | Resp 16 | Ht 67.0 in | Wt 163.6 lb

## 2011-03-21 DIAGNOSIS — Z9889 Other specified postprocedural states: Secondary | ICD-10-CM

## 2011-03-21 NOTE — Progress Notes (Signed)
Patient returns today. She continues to have diarrhea after her laparoscopic cholecystectomy. She was seen 5 days ago the emergency room. She is having more abdominal pain and diarrhea. CT scan showed a mildly thickened colon consistent with colitis without significant inflammatory change. She has multiple loose bowel movements daily. Her white count another lap findings were normal.  Exam: A female in no apparent stress. Abdomen benign. Scar is healed. No peritonitis. Impression: Post cholecystectomy diarrhea on cholestyramine will check stool for C. difficile. Continue cholestyramine. Encouraged hydration and will switch her to vancomycin 250 mg q.6 p.o. and stop the Flagyl and Cipro. Return to clinic one week. If this continues, she read to be seen by gastroenterology.

## 2011-03-21 NOTE — Patient Instructions (Signed)
We wil repeat stool sample.  Stop bentyl cipro and flagyl and will start vancomycin 250 mg by mouth every 6 hours.  Stay hydrated.

## 2011-03-22 ENCOUNTER — Other Ambulatory Visit (INDEPENDENT_AMBULATORY_CARE_PROVIDER_SITE_OTHER): Payer: Self-pay | Admitting: Surgery

## 2011-03-25 ENCOUNTER — Ambulatory Visit (INDEPENDENT_AMBULATORY_CARE_PROVIDER_SITE_OTHER): Payer: Medicaid Other | Admitting: Surgery

## 2011-03-25 ENCOUNTER — Encounter (INDEPENDENT_AMBULATORY_CARE_PROVIDER_SITE_OTHER): Payer: Self-pay | Admitting: Surgery

## 2011-03-25 VITALS — BP 122/84 | HR 68 | Temp 97.3°F | Resp 16 | Ht 67.0 in | Wt 168.6 lb

## 2011-03-25 DIAGNOSIS — Z9889 Other specified postprocedural states: Secondary | ICD-10-CM

## 2011-03-25 LAB — STOOL CULTURE

## 2011-03-25 NOTE — Progress Notes (Signed)
The patient returns. Her diarrhea is better. C. difficile is negative.  Exam: Benign abdomen  Impression: Post cholecystectomy diarrhea  Plan: She is improved. Continue cholestyramine. Followup 6 weeks. Recommend low-fat diet and keeping hydrated.

## 2011-03-25 NOTE — Patient Instructions (Signed)
Return 6 weeks. Keep hydrated.

## 2011-04-17 ENCOUNTER — Emergency Department (HOSPITAL_COMMUNITY): Payer: Medicaid Other

## 2011-04-17 ENCOUNTER — Emergency Department (HOSPITAL_COMMUNITY)
Admission: EM | Admit: 2011-04-17 | Discharge: 2011-04-17 | Disposition: A | Discharge status: Home / Self Care | Payer: Medicaid Other | Attending: Emergency Medicine | Admitting: Emergency Medicine

## 2011-04-17 ENCOUNTER — Other Ambulatory Visit: Payer: Self-pay

## 2011-04-17 ENCOUNTER — Encounter (HOSPITAL_COMMUNITY): Payer: Self-pay | Admitting: *Deleted

## 2011-04-17 DIAGNOSIS — F3289 Other specified depressive episodes: Secondary | ICD-10-CM | POA: Insufficient documentation

## 2011-04-17 DIAGNOSIS — I10 Essential (primary) hypertension: Secondary | ICD-10-CM | POA: Insufficient documentation

## 2011-04-17 DIAGNOSIS — R1110 Vomiting, unspecified: Secondary | ICD-10-CM | POA: Insufficient documentation

## 2011-04-17 DIAGNOSIS — R197 Diarrhea, unspecified: Secondary | ICD-10-CM | POA: Insufficient documentation

## 2011-04-17 DIAGNOSIS — F329 Major depressive disorder, single episode, unspecified: Secondary | ICD-10-CM | POA: Insufficient documentation

## 2011-04-17 DIAGNOSIS — K529 Noninfective gastroenteritis and colitis, unspecified: Secondary | ICD-10-CM | POA: Insufficient documentation

## 2011-04-17 DIAGNOSIS — K219 Gastro-esophageal reflux disease without esophagitis: Secondary | ICD-10-CM | POA: Insufficient documentation

## 2011-04-17 LAB — HEPATIC FUNCTION PANEL
AST: 19 U/L (ref 0–37)
Albumin: 4.6 g/dL (ref 3.5–5.2)
Alkaline Phosphatase: 59 U/L (ref 39–117)
Bilirubin, Direct: 0.1 mg/dL (ref 0.0–0.3)
Total Bilirubin: 0.3 mg/dL (ref 0.3–1.2)

## 2011-04-17 LAB — POCT I-STAT, CHEM 8
Chloride: 105 mEq/L (ref 96–112)
Creatinine, Ser: 0.7 mg/dL (ref 0.50–1.10)
Hemoglobin: 15.3 g/dL — ABNORMAL HIGH (ref 12.0–15.0)
Potassium: 3.8 mEq/L (ref 3.5–5.1)
Sodium: 142 mEq/L (ref 135–145)

## 2011-04-17 LAB — DIFFERENTIAL
Eosinophils Absolute: 0.1 10*3/uL (ref 0.0–0.7)
Lymphs Abs: 1.8 10*3/uL (ref 0.7–4.0)
Monocytes Absolute: 0.5 10*3/uL (ref 0.1–1.0)
Monocytes Relative: 9 % (ref 3–12)
Neutro Abs: 2.9 10*3/uL (ref 1.7–7.7)
Neutrophils Relative %: 55 % (ref 43–77)

## 2011-04-17 LAB — CBC
HCT: 43.1 % (ref 36.0–46.0)
Hemoglobin: 13.8 g/dL (ref 12.0–15.0)
MCH: 30.9 pg (ref 26.0–34.0)
RBC: 4.47 MIL/uL (ref 3.87–5.11)

## 2011-04-17 MED ORDER — MORPHINE SULFATE 4 MG/ML IJ SOLN
4.0000 mg | Freq: Once | INTRAMUSCULAR | Status: AC
  Administered 2011-04-17: 4 mg via INTRAVENOUS
  Filled 2011-04-17: qty 1

## 2011-04-17 MED ORDER — ONDANSETRON 8 MG PO TBDP: 8.0000 mg | ORAL_TABLET | Freq: Three times a day (TID) | ORAL | Status: AC | PRN

## 2011-04-17 MED ORDER — HYDROCODONE-ACETAMINOPHEN 5-325 MG PO TABS
1.0000 | ORAL_TABLET | Freq: Once | ORAL | Status: AC
  Administered 2011-04-17: 1 via ORAL
  Filled 2011-04-17: qty 1

## 2011-04-17 MED ORDER — SODIUM CHLORIDE 0.9 % IV BOLUS (SEPSIS)
1000.0000 mL | Freq: Once | INTRAVENOUS | Status: AC
  Administered 2011-04-17: 1000 mL via INTRAVENOUS

## 2011-04-17 MED ORDER — ONDANSETRON HCL 4 MG/2ML IJ SOLN
4.0000 mg | Freq: Once | INTRAMUSCULAR | Status: AC
  Administered 2011-04-17: 4 mg via INTRAVENOUS
  Filled 2011-04-17: qty 2

## 2011-04-17 MED ORDER — SUCRALFATE 1 GM/10ML PO SUSP: 1.0000 g | Freq: Four times a day (QID) | ORAL | Status: AC

## 2011-04-17 MED ORDER — LOPERAMIDE HCL 2 MG PO CAPS
4.0000 mg | ORAL_CAPSULE | Freq: Once | ORAL | Status: AC
  Administered 2011-04-17: 4 mg via ORAL
  Filled 2011-04-17: qty 2

## 2011-04-17 MED ORDER — CIPROFLOXACIN HCL 500 MG PO TABS: 500.0000 mg | ORAL_TABLET | Freq: Two times a day (BID) | ORAL | Status: AC

## 2011-04-17 MED ORDER — HYDROCODONE-ACETAMINOPHEN 5-325 MG PO TABS: 1.0000 | ORAL_TABLET | ORAL | Status: AC | PRN

## 2011-04-17 MED ORDER — HYDROMORPHONE HCL PF 1 MG/ML IJ SOLN
1.0000 mg | Freq: Once | INTRAMUSCULAR | Status: AC
  Administered 2011-04-17: 1 mg via INTRAVENOUS
  Filled 2011-04-17: qty 1

## 2011-04-17 MED ORDER — METRONIDAZOLE 500 MG PO TABS: 500.0000 mg | ORAL_TABLET | Freq: Two times a day (BID) | ORAL | Status: AC

## 2011-04-17 MED ORDER — SODIUM CHLORIDE 0.9 % IV SOLN
INTRAVENOUS | Status: DC
  Administered 2011-04-17 (×2): via INTRAVENOUS

## 2011-04-17 MED ORDER — ONDANSETRON 4 MG PO TBDP
8.0000 mg | ORAL_TABLET | Freq: Once | ORAL | Status: AC
  Administered 2011-04-17: 8 mg via ORAL
  Filled 2011-04-17: qty 2

## 2011-04-17 MED ORDER — GI COCKTAIL ~~LOC~~
30.0000 mL | Freq: Once | ORAL | Status: AC
  Administered 2011-04-17: 30 mL via ORAL
  Filled 2011-04-17: qty 30

## 2011-04-17 MED ORDER — IOHEXOL 300 MG/ML  SOLN
80.0000 mL | Freq: Once | INTRAMUSCULAR | Status: AC | PRN
  Administered 2011-04-17: 80 mL via INTRAVENOUS

## 2011-04-17 NOTE — ED Notes (Signed)
Pt. Was given pain med (morphine) and stated that pain has not changed still currently a 10.

## 2011-04-17 NOTE — ED Notes (Signed)
Pt. Complains of abdominal pain starting x6 days ago. States that she had gallbladder surgery 1 month ago. Every since surgery she has had nausea/vomiting, diarrhea.

## 2011-04-17 NOTE — Discharge Instructions (Signed)
Please review the instructions below. All of your blood work was normal. The CT of your abdomen has findings consistent with an infectious/inflammatory bowel process. Review the information on colitis below. We are treating this infection with two antibiotics.... Flagyl, and Cipro. Please take as directed and be sure to complete them. We have also prescribed medication for nausea, pain and a medication for your esophageal burning. This burning is likely related to your vomiting. Take the medications as directed and call Dr. Elnoria Howard tomorrow to arrange follow up. If you would prefer to consult with him first before starting the Flagyl and Cipro that is certainly an option. Return for worsening symptoms otherwise follow up as discussed.     Esophagitis Esophagitis is inflammation of the esophagus. It can involve swelling, soreness, and pain in the esophagus. This condition can make it difficult and painful to swallow. CAUSES  Most causes of esophagitis are not serious. Many different factors can cause esophagitis, including:  Gastroesophageal reflux disease (GERD). This is when acid from your stomach flows up into the esophagus.   Recurrent vomiting.   An allergic-type reaction.   Certain medicines, especially those that come in large pills.   Ingestion of harmful chemicals, such as household cleaning products.   Heavy alcohol use.   An infection of the esophagus.   Radiation treatment for cancer.   Certain diseases such as sarcoidosis, Crohn's disease, and scleroderma. These diseases may cause recurrent esophagitis.  SYMPTOMS   Trouble swallowing.   Painful swallowing.   Chest pain.   Difficulty breathing.   Nausea.   Vomiting.   Abdominal pain.  DIAGNOSIS  Your caregiver will take your history and do a physical exam. Depending upon what your caregiver finds, certain tests may also be done, including:  Barium X-ray. You will drink a solution that coats the esophagus, and  X-rays will be taken.   Endoscopy. A lighted tube is put down the esophagus so your caregiver can examine the area.   Allergy tests. These can sometimes be arranged through follow-up visits.  TREATMENT  Treatment will depend on the cause of your esophagitis. In some cases, steroids or other medicines may be given to help relieve your symptoms or to treat the underlying cause of your condition. Medicines that may be recommended include:  Viscous lidocaine, to soothe the esophagus.   Antacids.   Acid reducers.   Proton pump inhibitors.   Antiviral medicines for certain viral infections of the esophagus.   Antifungal medicines for certain fungal infections of the esophagus.   Antibiotic medicines, depending on the cause of the esophagitis.  HOME CARE INSTRUCTIONS   Avoid foods and drinks that seem to make your symptoms worse.   Eat small, frequent meals instead of large meals.   Avoid eating for the 3 hours prior to your bedtime.   If you have trouble taking pills, use a pill splitter to decrease the size and likelihood of the pill getting stuck or injuring the esophagus on the way down. Drinking water after taking a pill also helps.   Stop smoking if you smoke.   Maintain a healthy weight.   Wear loose-fitting clothing. Do not wear anything tight around your waist that causes pressure on your stomach.   Raise the head of your bed 6 to 8 inches with wood blocks to help you sleep. Extra pillows will not help.   Only take over-the-counter or prescription medicines as directed by your caregiver.  SEEK IMMEDIATE MEDICAL CARE IF:  You  have severe chest pain that radiates into your arm, neck, or jaw.   You feel sweaty, dizzy, or lightheaded.   You have shortness of breath.   You vomit blood.   You have difficulty or pain with swallowing.   You have bloody or black, tarry stools.   You have a fever.   You have a burning sensation in the chest more than 3 times a week for  more than 2 weeks.   You cannot swallow, drink, or eat.   You drool because you cannot swallow your saliva.  MAKE SURE YOU:  Understand these instructions.   Will watch your condition.   Will get help right away if you are not doing well or get worse.  Document Released: 02/22/2004 Document Revised: 01/03/2011 Document Reviewed: 09/14/2010 [facility] Patient Information 2012 [place], [place].

## 2011-04-17 NOTE — ED Provider Notes (Signed)
Patient reports that she feels much better after IV fluids, medications and GI cocktail. I have discussed the results of her CT scan and our recommendation for a Flagyl/Cipro regimen. Patient is somewhat hesitant as she has just recently completed this Flagyl/Cipro regimen. I provided her with the option of consulting with her GI physician prior to starting the antibiotics. We are also prescribing medication for nausea, pain and the medication for her esophageal burning. Patient is to arrange followup with Dr. Elnoria Howard as soon as possible for further evaluation. Patient is agreeable with plan. I have discussed the patient, CT results and discharge plan with Dr. Ignacia Palma who is in complete agreement with my plan.  Leanne Chang, NP 04/17/11 2351  Medical screening examination/treatment/procedure(s) were performed by non-physician practitioner and as supervising physician I was immediately available for consultation/collaboration. Osvaldo Human, M.D.   Carleene Cooper III, MD 04/19/11 1357

## 2011-04-17 NOTE — ED Notes (Signed)
Pt reports intermittent nausea and diarrhea since she had her gallbladder removed jan 31. Pt states she is unable to eat or drink much. Pt has been seen for same and has had followup.

## 2011-04-17 NOTE — ED Provider Notes (Addendum)
History     CSN: [id number]  Arrival date & time 04/17/11  1252   First MD Initiated Contact with Patient 04/17/11 1753      Chief Complaint  Patient presents with  . Emesis  . Diarrhea    (Consider location/radiation/quality/duration/timing/severity/associated sxs/prior treatment) Patient is a 41 y.o. female presenting with vomiting and diarrhea. The history is provided by the patient.  Emesis  Associated symptoms include diarrhea.  Diarrhea The primary symptoms include vomiting and diarrhea.  She had a cholecystectomy done 2 months ago, and has been having diarrhea since then. She's also had some crampy lower abdominal pain and intermittent nausea. She is vomited a couple of occasions. Diarrhea has gotten worse over the last week. She has not had any fever or sweats, but has had chills. She has seen her doctor several times. She says she has not taken anything for the diarrhea. Yesterday, she says that she got dizzy and passed out. Symptoms are described as severe.  Past Medical History  Diagnosis Date  . Dysrhythmia     not followed by cardiogist  . Anemia   . Blood transfusion   . Seizures   . Headache   . Depression   . Hypertension     Dr. Knox Royalty [phone]  . Abdominal pain     takes omeprazole    Past Surgical History  Procedure Date  . Abdominal hysterectomy   . Cesarean section     x2  . Abdominoplasty   . Cervical spine surgery     2002  . Shoulder surgery     left humeral head replacement 2001  . Breast surgery     breast implants x 2  . Cholecystectomy 02/28/2011    Procedure: LAPAROSCOPIC CHOLECYSTECTOMY;  Surgeon: Clovis Pu. Cornett, MD;  Location: [facility] OR;  Service: General;  Laterality: N/A;  Laparoscopic cholecystectomy.    Family History  Problem Relation Age of Onset  . Cancer Mother     colon, skin  . Anesthesia problems Mother   . Heart disease Father     History  Substance Use Topics  . Smoking status: Never Smoker   .  Smokeless tobacco: Never Used  . Alcohol Use: No    OB History    Grav Para Term Preterm Abortions TAB SAB Ect Mult Living                  Review of Systems  Gastrointestinal: Positive for vomiting and diarrhea.  All other systems reviewed and are negative.    Allergies  Latex and Naproxen  Home Medications   Current Outpatient Rx  Name Route Sig Dispense Refill  . CHOLESTYRAMINE 4 G PO PACK Oral Take 1 packet by mouth 3 (three) times daily with meals. 60 each 2  . DULOXETINE HCL 60 MG PO CPEP Oral Take 60 mg by mouth daily.    Marland Kitchen ADVIL PM PO Oral Take 2 tablets by mouth at bedtime as needed. For sleep     . LISINOPRIL-HYDROCHLOROTHIAZIDE 10-12.5 MG PO TABS Oral Take 1 tablet by mouth daily.    Marland Kitchen OMEPRAZOLE 40 MG PO CPDR Oral Take 40 mg by mouth 2 (two) times daily.      . TRAMADOL HCL 50 MG PO TABS Oral Take 50 mg by mouth 2 (two) times daily as needed. For pain Maximum dose= 8 tablets per day    . DICYCLOMINE HCL 20 MG PO TABS Oral Take 20 mg by mouth every 6 (six) hours.  BP 128/92  Pulse 89  Temp(Src) 97.6 F (36.4 C) (Oral)  Resp 18  SpO2 100%  Physical Exam  Nursing note and vitals reviewed.  41 year old female who is resting comfortably and in no acute distress. Vital signs are significant for borderline hypertension with blood pressure 130/93. Oxygen saturation is 100% which is normal. Head is normocephalic and atraumatic. PERRLA, EOMI. There is no scleral icterus. Mucous membranes are moist. Neck is nontender and supple. Lungs are clear without rales, wheezes, rhonchi. Back is nontender. Heart has regular rate and rhythm without murmur. Abdomen is soft, flat, with mild tenderness in the left lower quadrant and suprapubic area without any rebound or guarding. Peristalsis is diminished. There is no hepatosplenomegaly. Extremities have no cyanosis or edema, full range of motion is present. Skin is warm and dry without rash. Neurologic: Mental status is normal,  cranial nerves are intact, there no focal motor or sensory deficits.  ED Course  Procedures (including critical care time)  Results for orders placed during the hospital encounter of 04/17/11  POCT I-STAT, CHEM 8      Component Value Range   Sodium 142  135 - 145 (mEq/L)   Potassium 3.8  3.5 - 5.1 (mEq/L)   Chloride 105  96 - 112 (mEq/L)   BUN 16  6 - 23 (mg/dL)   Creatinine, Ser 1.61  0.50 - 1.10 (mg/dL)   Glucose, Bld 89  70 - 99 (mg/dL)   Calcium, Ion 0.96  0.45 - 1.32 (mmol/L)   TCO2 25  0 - 100 (mmol/L)   Hemoglobin 15.3 (*) 12.0 - 15.0 (g/dL)   HCT 40.9  81.1 - 91.4 (%)  CBC      Component Value Range   WBC 5.2  4.0 - 10.5 (K/uL)   RBC 4.47  3.87 - 5.11 (MIL/uL)   Hemoglobin 13.8  12.0 - 15.0 (g/dL)   HCT 78.2  95.6 - 21.3 (%)   MCV 96.4  78.0 - 100.0 (fL)   MCH 30.9  26.0 - 34.0 (pg)   MCHC 32.0  30.0 - 36.0 (g/dL)   RDW 08.6  57.8 - 46.9 (%)   Platelets 366  150 - 400 (K/uL)  DIFFERENTIAL      Component Value Range   Neutrophils Relative 55  43 - 77 (%)   Neutro Abs 2.9  1.7 - 7.7 (K/uL)   Lymphocytes Relative 34  12 - 46 (%)   Lymphs Abs 1.8  0.7 - 4.0 (K/uL)   Monocytes Relative 9  3 - 12 (%)   Monocytes Absolute 0.5  0.1 - 1.0 (K/uL)   Eosinophils Relative 1  0 - 5 (%)   Eosinophils Absolute 0.1  0.0 - 0.7 (K/uL)   Basophils Relative 1  0 - 1 (%)   Basophils Absolute 0.0  0.0 - 0.1 (K/uL)  HEPATIC FUNCTION PANEL      Component Value Range   Total Protein 8.4 (*) 6.0 - 8.3 (g/dL)   Albumin 4.6  3.5 - 5.2 (g/dL)   AST 19  0 - 37 (U/L)   ALT 18  0 - 35 (U/L)   Alkaline Phosphatase 59  39 - 117 (U/L)   Total Bilirubin 0.3  0.3 - 1.2 (mg/dL)   Bilirubin, Direct 0.1  0.0 - 0.3 (mg/dL)   Indirect Bilirubin 0.2 (*) 0.3 - 0.9 (mg/dL)  LIPASE, BLOOD      Component Value Range   Lipase 32  11 - 59 (U/L)    ECG shows  normal sinus rhythm with a rate of 84, no ectopy. Normal axis. Normal P wave. Normal QRS. Normal intervals. Normal ST and T waves. Impression:  normal ECG. No old ECG available for comparison.  She got no relief with morphine and Zofran and loperamide. She is already been given cholestyramine with no benefit. If she is able to give a sample, I will send a specimen for testing for Clostridium difficile. CT scan will be repeated and she is given hydromorphone for pain. Case is endorsed to Dr. Ignacia Palma.  No diagnosis found.    MDM  Postcholecystectomy diarrhea. I have reviewed her old records and her cholecystectomy was done January 31, and she has had several visits to her surgeon with complaints of diarrhea. She also had an ED visit with a CT scan which showed some thickening of the colon in a nonspecific pattern. I suspect that she is having primarily stearrhea secondary to the in post cholecystectomy. Laboratory workup is ordered and I will consider giving her a trial of pancrease if workup is negative.        Dione Booze, MD 04/17/11 2100  Dione Booze, MD 04/17/11 2101

## 2011-04-17 NOTE — ED Notes (Signed)
Patient transported to CT 

## 2011-04-17 NOTE — ED Notes (Signed)
Patient reporting abdominal pain; vitals are stable; patient noted to have been at the vending machine.

## 2011-04-22 ENCOUNTER — Other Ambulatory Visit (INDEPENDENT_AMBULATORY_CARE_PROVIDER_SITE_OTHER): Payer: Self-pay | Admitting: Surgery

## 2011-05-06 ENCOUNTER — Encounter (INDEPENDENT_AMBULATORY_CARE_PROVIDER_SITE_OTHER): Payer: Medicaid Other | Admitting: Surgery

## 2011-05-08 ENCOUNTER — Emergency Department (INDEPENDENT_AMBULATORY_CARE_PROVIDER_SITE_OTHER): Payer: Medicaid Other

## 2011-05-08 ENCOUNTER — Encounter (HOSPITAL_BASED_OUTPATIENT_CLINIC_OR_DEPARTMENT_OTHER): Payer: Self-pay

## 2011-05-08 ENCOUNTER — Emergency Department (HOSPITAL_BASED_OUTPATIENT_CLINIC_OR_DEPARTMENT_OTHER)
Admission: EM | Admit: 2011-05-08 | Discharge: 2011-05-08 | Disposition: A | Discharge status: Home / Self Care | Payer: Medicaid Other | Attending: Emergency Medicine | Admitting: Emergency Medicine

## 2011-05-08 DIAGNOSIS — R634 Abnormal weight loss: Secondary | ICD-10-CM | POA: Insufficient documentation

## 2011-05-08 DIAGNOSIS — R197 Diarrhea, unspecified: Secondary | ICD-10-CM | POA: Insufficient documentation

## 2011-05-08 DIAGNOSIS — R42 Dizziness and giddiness: Secondary | ICD-10-CM | POA: Insufficient documentation

## 2011-05-08 DIAGNOSIS — R112 Nausea with vomiting, unspecified: Secondary | ICD-10-CM

## 2011-05-08 DIAGNOSIS — I10 Essential (primary) hypertension: Secondary | ICD-10-CM | POA: Insufficient documentation

## 2011-05-08 DIAGNOSIS — R1032 Left lower quadrant pain: Secondary | ICD-10-CM | POA: Insufficient documentation

## 2011-05-08 DIAGNOSIS — F329 Major depressive disorder, single episode, unspecified: Secondary | ICD-10-CM | POA: Insufficient documentation

## 2011-05-08 DIAGNOSIS — F3289 Other specified depressive episodes: Secondary | ICD-10-CM | POA: Insufficient documentation

## 2011-05-08 DIAGNOSIS — G40909 Epilepsy, unspecified, not intractable, without status epilepticus: Secondary | ICD-10-CM | POA: Insufficient documentation

## 2011-05-08 LAB — DIFFERENTIAL
Basophils Absolute: 0 10*3/uL (ref 0.0–0.1)
Basophils Relative: 1 % (ref 0–1)
Eosinophils Absolute: 0.1 10*3/uL (ref 0.0–0.7)
Eosinophils Relative: 1 % (ref 0–5)
Lymphocytes Relative: 29 % (ref 12–46)
Monocytes Absolute: 0.5 10*3/uL (ref 0.1–1.0)

## 2011-05-08 LAB — COMPREHENSIVE METABOLIC PANEL
ALT: 77 U/L — ABNORMAL HIGH (ref 0–35)
AST: 43 U/L — ABNORMAL HIGH (ref 0–37)
Albumin: 4.4 g/dL (ref 3.5–5.2)
Calcium: 9.9 mg/dL (ref 8.4–10.5)
Creatinine, Ser: 0.6 mg/dL (ref 0.50–1.10)
GFR calc non Af Amer: >90 mL/min (ref >90)
Sodium: 136 mEq/L (ref 135–145)
Total Protein: 7.5 g/dL (ref 6.0–8.3)

## 2011-05-08 LAB — CBC
HCT: 38.6 % (ref 36.0–46.0)
MCV: 95.5 fL (ref 78.0–100.0)
RBC: 4.04 MIL/uL (ref 3.87–5.11)
WBC: 4.3 10*3/uL (ref 4.0–10.5)

## 2011-05-08 MED ORDER — SODIUM CHLORIDE 0.9 % IV BOLUS (SEPSIS)
1000.0000 mL | Freq: Once | INTRAVENOUS | Status: AC
  Administered 2011-05-08: 1000 mL via INTRAVENOUS

## 2011-05-08 MED ORDER — PROMETHAZINE HCL 25 MG/ML IJ SOLN
25.0000 mg | Freq: Once | INTRAMUSCULAR | Status: AC
  Administered 2011-05-08: 25 mg via INTRAVENOUS

## 2011-05-08 MED ORDER — ONDANSETRON HCL 4 MG/2ML IJ SOLN
4.0000 mg | Freq: Once | INTRAMUSCULAR | Status: AC
  Administered 2011-05-08: 4 mg via INTRAVENOUS
  Filled 2011-05-08: qty 2

## 2011-05-08 MED ORDER — FENTANYL CITRATE 0.05 MG/ML IJ SOLN
50.0000 ug | Freq: Once | INTRAMUSCULAR | Status: AC
  Administered 2011-05-08: 50 ug via INTRAVENOUS
  Filled 2011-05-08: qty 2

## 2011-05-08 MED ORDER — HYDROCODONE-ACETAMINOPHEN 5-325 MG PO TABS
1.0000 | ORAL_TABLET | Freq: Once | ORAL | Status: AC
  Administered 2011-05-08: 1 via ORAL
  Filled 2011-05-08: qty 1

## 2011-05-08 MED ORDER — PROMETHAZINE HCL 25 MG/ML IJ SOLN
INTRAMUSCULAR | Status: AC
  Administered 2011-05-08: 25 mg via INTRAVENOUS
  Filled 2011-05-08: qty 1

## 2011-05-08 NOTE — Discharge Instructions (Signed)
Dolor abdominal, versin ampliada (Abdominal Pain, Nonspecific) El anlisis podra no mostrar la razn exacta por la que tiene dolor abdominal. Debido a que hay muchas causas distintas de dolor abdominal, se podr necesitar otro control y ms anlisis. Es muy importante el seguimiento para observar los sntomas duraderos (persistentes) o los que empeoran. Una causa posible de dolor abdominal en cualquier persona que an tiene su apndice es la apendicitis aguda. La apendicitis es a menudo difcil de diagnosticar. Los anlisis de sangre, orina, ultrasonido y tomografa computada no pueden descartar por completo la apendicitis u otra causas de dolor abdominal. A veces, slo los cambios que se producen a travs del tiempo permitirn determinar si el dolor abdominal se debe al apendicitis o a otras causas. Otros problemas potenciales que pueden requerir ciruga tambin pueden tomar algn tiempo hasta ser evidentes. Debido a esto, es importante seguir todas las instrucciones de ms abajo. INSTRUCCIONES PARA EL CUIDADO DOMICILIARIO  Descanse todo lo que pueda.   No ingiera alimentos slidos hasta que el dolor desaparezca.   Cuando un adulto o un nio siente dolor: Puede beneficiarlo una dieta basada en agua, t liviano descafeinado, caldo o consom, gelatina, solucin de rehidratacin oral, helados de agua o trocitos de hielo.   Cuando el adulto o el nio no sienten ms dolor: Consuma una dieta liviana (tostadas secas, crackers, jugo de manzana o arroz blanco). Incorpore ms alimentos lentamente, siempre que esto no le cause ningn trastorno. No consuma productos lcteos (incluyendo queso y huevos) ni ingiera alimentos condimentados, grasos, fritos o con gran cantidad de fibra.   No consuma alcohol, cafena ni cigarrillos.   Tome sus medicamentos regularmente, excepto que el profesional le indique lo contrario.   Utilice los medicamentos de venta libre o de prescripcin para el dolor, el malestar o la  fiebre, segn se lo indique el profesional que lo asiste.   Utilice los medicamentos de venta libre o de prescripcin para el dolor, el malestar o la fiebre, segn se lo indique el profesional que lo asiste. No administre aspirina a los nios.  Si el mdico le ha dado fecha para una visita de control, es importante que concurra. No cumplir con este control puede dar como resultado que el dao, el dolor o la discapacidad sean permanentes (crnicos). Si tiene problemas para asistir al control, deber comunicarlo en este establecimiento para recibir asesoramiento.  SOLICITE ATENCIN MDICA DE INMEDIATO SI:  Usted o su nio han sufrido dolor por ms de 24 horas.   El dolor empeora, cambia de lugar o se siente diferente.   Usted o su nio tienen una temperatura oral de ms de 102 F (38.9 C) y no puede ser controlada con medicamentos.   Su beb tiene ms de 3 meses y su temperatura rectal es de 102 F (38.9 C) o ms.   Su beb tiene 3 meses o menos y su temperatura rectal es de 100.4 F (38 C) o ms.   Usted o su hijo tienen escalofros.   Continan con vmitos y no pueden retener lquidos.   Observa sangre en el vmito o en la materia fecal.   Las heces son oscuras o negras.   Los movimientos intestinales son frecuentes.   Los movimientos intestinales se detienen (hay una obstruccin) o no pueden eliminarse los gases.   Siente dolor al orinar o lo hace con frecuencia u observa sangre en la orina.   La piel y la zona blanca de los ojos cambian de color y se tornan amarillos.     Observa que el estmago se hincha o est ms grande.   Sienten mareos o desmayos.   Sienten dolor en el pecho o la espalda.  EST SEGURO QUE:   Comprende las instrucciones para el alta mdica.   Controlar su enfermedad.   Solicitar atencin mdica de inmediato segn las indicaciones.  Document Released: 04/23/2007 Document Revised: 01/03/2011 [employer] Patient Information 2012 [employer]. 

## 2011-05-08 NOTE — ED Provider Notes (Addendum)
History     CSN: [id number]  Arrival date & time 05/08/11  4403   First MD Initiated Contact with Patient 05/08/11 [phone]      Chief Complaint  Patient presents with  . Diarrhea  . Emesis  . Nausea    (Consider location/radiation/quality/duration/timing/severity/associated sxs/prior treatment) HPI Comments: Patient presents with abdominal pain and nausea and vomiting since January.  Patient was noted to have the symptoms and ultimately had a cholecystectomy in January.  Even since her surgery patient has had persistent problems with abdominal pain, nausea, vomiting and diarrhea.  On review of her postoperative surgical notes they felt this was likely related to her cholecystectomy initially.  Since that time patient has been referred to and seen by Dr. Audley Hose from GI.  He has performed a colonoscopy on her and found no acute causes for her symptoms.  Patient is now being referred to GI at [facility].  Patient has no new fevers.  Today she got lightheaded so her husband brought her in.  He notes several pound weight loss over the last week.  They do not note that the symptoms are significantly changed from what has been ongoing over the last few months.  Patient is a 41 y.o. female presenting with diarrhea and vomiting. The history is provided by the patient and the spouse. No language interpreter was used.  Diarrhea The primary symptoms include weight loss, abdominal pain, nausea, vomiting and diarrhea. Primary symptoms do not include fever, fatigue, melena, hematemesis, jaundice, hematochezia, dysuria, myalgias, arthralgias or rash. The illness began more than 7 days ago. The onset was gradual. The problem has not changed since onset. The illness does not include chills or back pain.  Emesis  Associated symptoms include abdominal pain and diarrhea. Pertinent negatives include no arthralgias, no chills, no cough, no fever, no headaches and no myalgias.    Past Medical History  Diagnosis  Date  . Dysrhythmia     not followed by cardiogist  . Anemia   . Blood transfusion   . Seizures   . Headache   . Depression   . Hypertension     Dr. Knox Royalty [phone]  . Abdominal pain     takes omeprazole    Past Surgical History  Procedure Date  . Abdominal hysterectomy   . Cesarean section     x2  . Abdominoplasty   . Cervical spine surgery     2002  . Shoulder surgery     left humeral head replacement 2001  . Breast surgery     breast implants x 2  . Cholecystectomy 02/28/2011    Procedure: LAPAROSCOPIC CHOLECYSTECTOMY;  Surgeon: Clovis Pu. Cornett, MD;  Location: [facility] OR;  Service: General;  Laterality: N/A;  Laparoscopic cholecystectomy.    Family History  Problem Relation Age of Onset  . Cancer Mother     colon, skin  . Anesthesia problems Mother   . Heart disease Father     History  Substance Use Topics  . Smoking status: Never Smoker   . Smokeless tobacco: Never Used  . Alcohol Use: No    OB History    Grav Para Term Preterm Abortions TAB SAB Ect Mult Living                  Review of Systems  Constitutional: Positive for weight loss. Negative for fever, chills and fatigue.  HENT: Negative.   Eyes: Negative.  Negative for discharge and redness.  Respiratory: Negative.  Negative for  cough and shortness of breath.   Cardiovascular: Negative.  Negative for chest pain.  Gastrointestinal: Positive for nausea, vomiting, abdominal pain and diarrhea. Negative for melena, hematochezia, hematemesis and jaundice.  Genitourinary: Negative.  Negative for dysuria and vaginal discharge.  Musculoskeletal: Negative.  Negative for myalgias, back pain and arthralgias.  Skin: Negative.  Negative for color change and rash.  Neurological: Positive for light-headedness. Negative for syncope and headaches.  Hematological: Negative.  Negative for adenopathy.  Psychiatric/Behavioral: Negative.  Negative for confusion.  All other systems reviewed and are  negative.    Allergies  Latex and Naproxen  Home Medications     BP 122/75  Pulse 92  Temp(Src) 98.9 F (37.2 C) (Oral)  Resp 18  Ht 5\' 7"  (1.702 m)  Wt 155 lb (70.308 kg)  BMI 24.28 kg/m2  SpO2 100%  Physical Exam  Nursing note and vitals reviewed. Constitutional: She is oriented to person, place, and time. She appears well-developed and well-nourished.  Non-toxic appearance. She does not have a sickly appearance.  HENT:  Head: Normocephalic and atraumatic.  Eyes: Conjunctivae, EOM and lids are normal. Pupils are equal, round, and reactive to light. No scleral icterus.  Neck: Trachea normal and normal range of motion. Neck supple.  Cardiovascular: Normal rate, regular rhythm and normal heart sounds.   Pulmonary/Chest: Effort normal and breath sounds normal.  Abdominal: Soft. Normal appearance. There is no tenderness. There is no rebound, no guarding and no CVA tenderness.       Left lower quadrant has slightly raised area that is nontender.  No rebound or guarding.  Abdomen is otherwise soft and benign  Musculoskeletal: Normal range of motion.  Neurological: She is alert and oriented to person, place, and time. She has normal strength.  Skin: Skin is warm, dry and intact. No rash noted.  Psychiatric: She has a normal mood and affect. Her behavior is normal. Judgment and thought content normal.    ED Course  Procedures (including critical care time)  Results for orders placed during the hospital encounter of 05/08/11  CBC      Component Value Range   WBC 4.3  4.0 - 10.5 (K/uL)   RBC 4.04  3.87 - 5.11 (MIL/uL)   Hemoglobin 13.1  12.0 - 15.0 (g/dL)   HCT 40.9  81.1 - 91.4 (%)   MCV 95.5  78.0 - 100.0 (fL)   MCH 32.4  26.0 - 34.0 (pg)   MCHC 33.9  30.0 - 36.0 (g/dL)   RDW 78.2  95.6 - 21.3 (%)   Platelets 251  150 - 400 (K/uL)  DIFFERENTIAL      Component Value Range   Neutrophils Relative 58  43 - 77 (%)   Neutro Abs 2.5  1.7 - 7.7 (K/uL)   Lymphocytes Relative  29  12 - 46 (%)   Lymphs Abs 1.2  0.7 - 4.0 (K/uL)   Monocytes Relative 12  3 - 12 (%)   Monocytes Absolute 0.5  0.1 - 1.0 (K/uL)   Eosinophils Relative 1  0 - 5 (%)   Eosinophils Absolute 0.1  0.0 - 0.7 (K/uL)   Basophils Relative 1  0 - 1 (%)   Basophils Absolute 0.0  0.0 - 0.1 (K/uL)  COMPREHENSIVE METABOLIC PANEL      Component Value Range   Sodium 136  135 - 145 (mEq/L)   Potassium 3.8  3.5 - 5.1 (mEq/L)   Chloride 99  96 - 112 (mEq/L)   CO2 28  19 -  32 (mEq/L)   Glucose, Bld 86  70 - 99 (mg/dL)   BUN 12  6 - 23 (mg/dL)   Creatinine, Ser 4.09  0.50 - 1.10 (mg/dL)   Calcium 9.9  8.4 - 81.1 (mg/dL)   Total Protein 7.5  6.0 - 8.3 (g/dL)   Albumin 4.4  3.5 - 5.2 (g/dL)   AST 43 (*) 0 - 37 (U/L)   ALT 77 (*) 0 - 35 (U/L)   Alkaline Phosphatase 64  39 - 117 (U/L)   Total Bilirubin 0.4  0.3 - 1.2 (mg/dL)   GFR calc non Af Amer >90  >90 (mL/min)   GFR calc Af Amer >90  >90 (mL/min)   Ct Abdomen Pelvis W Contrast  04/17/2011  *RADIOLOGY REPORT*  Clinical Data: Nausea, vomiting and diarrhea.  CT ABDOMEN AND PELVIS WITH CONTRAST  Technique:  Multidetector CT imaging of the abdomen and pelvis was performed following the standard protocol during bolus administration of intravenous contrast.  Contrast: 80mL OMNIPAQUE IOHEXOL 300 MG/ML IJ SOLN  Comparison: CT of the abdomen and pelvis performed 03/17/2011  Findings: Minimal bibasilar atelectasis is noted.  Bilateral breast implants are grossly unremarkable in appearance.  There is mild diffuse prominence of the intrahepatic biliary ducts, within normal limits status post cholecystectomy.  Clips are noted along the gallbladder fossa.  The pancreas and adrenal glands are unremarkable.  A 5 mm stone is noted at the upper pole of the left kidney.  The kidneys are otherwise unremarkable in appearance.  There is no evidence of hydronephrosis.  No obstructing ureteral stones are seen.  No perinephric stranding is appreciated.  No free fluid is identified.   The small bowel is unremarkable in appearance.  The stomach is filled with contrast and is within normal limits.  No acute vascular abnormalities are seen.  A retroaortic left renal vein is incidentally seen.  The appendix is normal in caliber, without evidence for appendicitis.  The colon is largely filled with fluid.  There is mild wall thickening along the distal sigmoid colon and rectum, raising question for a mild infectious process.  The bladder is decompressed and not well assessed.  The patient is status post hysterectomy.  A clip is noted at the right side of the pelvis.  No suspicious adnexal masses are seen.  No inguinal lymphadenopathy is seen.  No acute osseous abnormalities are identified.  IMPRESSION:  1.  Mild wall thickening along the distal sigmoid colon and rectum, raising suspicion for a mild infectious process.  Large amount of fluid noted within the colon. 2.  5 mm nonobstructing stone noted at the upper pole of the left kidney.  Original Report Authenticated By: Tonia Ghent, M.D.   Dg Abd Acute W/chest  05/08/2011  *RADIOLOGY REPORT*  Clinical Data: The diarrhea, emesis and nausea.  ACUTE ABDOMEN SERIES (ABDOMEN 2 VIEW & CHEST 1 VIEW)  Comparison: Abdominal CT scan 04/17/2011.  Findings: Lung volumes are normal.  No consolidative airspace disease.  No pleural effusions.  No pneumothorax.  No pulmonary nodule or mass noted.  Pulmonary vasculature and the cardiomediastinal silhouette are within normal limits.  Orthopedic fixation hardware is incompletely visualized in the lower cervical and upper thoracic spine.  No pneumoperitoneum.  Supine and upright views of the abdomen demonstrate gas and stool scattered throughout the colon extending to the distal rectum.  No pathologic dilatation of small bowel is noted.  Surgical clips project over the right upper quadrant of the abdomen, compatible with prior cholecystectomy. A single  surgical clip projects over the lower central pelvis.   IMPRESSION: 1.  Nonobstructive bowel gas pattern. 2.  No pneumoperitoneum. 3.  No radiographic evidence of acute cardiopulmonary disease. 4.  Status post cholecystectomy.  Original Report Authenticated By: Florencia Reasons, M.D.       MDM  Patient with chronic problems with nausea, vomiting and diarrhea since prior to her cholecystectomy in January.  Her symptoms today have not significantly changed.  The husband brought her in because she has been dizzy and lightheaded with standing.  Patient has diffuse abdominal pain that she states is not different from normal.  At this point in time I will treat the patient's pain, nausea and hydrate her with IV fluids.  I will check repeat laboratory studies for her LFTs for any abnormalities.  Patient has a soft and benign abdomen.  She does note a slightly raised hardened area in her left lower quadrant to obtain an acute abdominal series to rule out obstruction although this seems less likely.  Patient has arrangements for further followup with GI at [facility] and we can control the patient's symptoms are today she will be able to be discharged with followup.        Nat Christen, MD 05/08/11 [phone]  Patient with no acute laboratory abnormalities at this time.  Her symptoms are improving here.  She has pain medicines or nausea medicines at home.  She already has outpatient followup arranged.  She is not lightheaded at this time has normal vital signs and I feel safe for discharge home.  Nat Christen, MD 05/08/11 [phone]

## 2011-05-08 NOTE — ED Notes (Signed)
Pt reports intermittent nausea, vomiting and diarrhea since January.

## 2011-05-09 ENCOUNTER — Encounter (INDEPENDENT_AMBULATORY_CARE_PROVIDER_SITE_OTHER): Payer: Self-pay | Admitting: Surgery

## 2011-05-28 ENCOUNTER — Encounter (HOSPITAL_COMMUNITY): Payer: Self-pay | Admitting: *Deleted

## 2011-05-28 ENCOUNTER — Emergency Department (HOSPITAL_COMMUNITY)
Admission: EM | Admit: 2011-05-28 | Discharge: 2011-05-28 | Disposition: A | Discharge status: Other Acute Inpatient Hospital | Payer: Medicaid Other | Source: Home / Self Care | Attending: Emergency Medicine | Admitting: Emergency Medicine

## 2011-05-28 ENCOUNTER — Inpatient Hospital Stay (HOSPITAL_COMMUNITY)
Admission: AD | Admit: 2011-05-28 | Discharge: 2011-06-06 | DRG: 103 | Disposition: A | Discharge status: Home / Self Care | Payer: Medicaid Other | Source: Ambulatory Visit | Attending: Psychiatry | Admitting: Psychiatry

## 2011-05-28 DIAGNOSIS — R279 Unspecified lack of coordination: Secondary | ICD-10-CM | POA: Diagnosis present

## 2011-05-28 DIAGNOSIS — F4310 Post-traumatic stress disorder, unspecified: Secondary | ICD-10-CM | POA: Diagnosis present

## 2011-05-28 DIAGNOSIS — R112 Nausea with vomiting, unspecified: Secondary | ICD-10-CM | POA: Diagnosis not present

## 2011-05-28 DIAGNOSIS — K5900 Constipation, unspecified: Secondary | ICD-10-CM | POA: Diagnosis not present

## 2011-05-28 DIAGNOSIS — R197 Diarrhea, unspecified: Secondary | ICD-10-CM | POA: Diagnosis not present

## 2011-05-28 DIAGNOSIS — F079 Unspecified personality and behavioral disorder due to known physiological condition: Secondary | ICD-10-CM | POA: Diagnosis present

## 2011-05-28 DIAGNOSIS — R109 Unspecified abdominal pain: Secondary | ICD-10-CM | POA: Diagnosis present

## 2011-05-28 DIAGNOSIS — T4275XA Adverse effect of unspecified antiepileptic and sedative-hypnotic drugs, initial encounter: Secondary | ICD-10-CM | POA: Diagnosis not present

## 2011-05-28 DIAGNOSIS — R404 Transient alteration of awareness: Secondary | ICD-10-CM | POA: Diagnosis not present

## 2011-05-28 DIAGNOSIS — F419 Anxiety disorder, unspecified: Secondary | ICD-10-CM | POA: Diagnosis present

## 2011-05-28 DIAGNOSIS — R569 Unspecified convulsions: Secondary | ICD-10-CM | POA: Diagnosis present

## 2011-05-28 DIAGNOSIS — R42 Dizziness and giddiness: Secondary | ICD-10-CM | POA: Diagnosis present

## 2011-05-28 DIAGNOSIS — R45851 Suicidal ideations: Secondary | ICD-10-CM | POA: Insufficient documentation

## 2011-05-28 DIAGNOSIS — I10 Essential (primary) hypertension: Secondary | ICD-10-CM | POA: Diagnosis present

## 2011-05-28 DIAGNOSIS — F411 Generalized anxiety disorder: Secondary | ICD-10-CM | POA: Diagnosis present

## 2011-05-28 DIAGNOSIS — F0781 Postconcussional syndrome: Principal | ICD-10-CM | POA: Diagnosis present

## 2011-05-28 DIAGNOSIS — G43909 Migraine, unspecified, not intractable, without status migrainosus: Secondary | ICD-10-CM | POA: Diagnosis present

## 2011-05-28 LAB — COMPREHENSIVE METABOLIC PANEL
ALT: 61 U/L — ABNORMAL HIGH (ref 0–35)
Alkaline Phosphatase: 60 U/L (ref 39–117)
BUN: 10 mg/dL (ref 6–23)
CO2: 27 mEq/L (ref 19–32)
Calcium: 9.4 mg/dL (ref 8.4–10.5)
GFR calc Af Amer: >90 mL/min (ref >90)
GFR calc non Af Amer: >90 mL/min (ref >90)
Glucose, Bld: 99 mg/dL (ref 70–99)
Potassium: 3.7 mEq/L (ref 3.5–5.1)
Sodium: 140 mEq/L (ref 135–145)
Total Protein: 7.8 g/dL (ref 6.0–8.3)

## 2011-05-28 LAB — POCT PREGNANCY, URINE: Preg Test, Ur: NEGATIVE

## 2011-05-28 LAB — CBC
HCT: 40.3 % (ref 36.0–46.0)
Hemoglobin: 13.5 g/dL (ref 12.0–15.0)
MCH: 31.8 pg (ref 26.0–34.0)
MCHC: 33.5 g/dL (ref 30.0–36.0)
RBC: 4.24 MIL/uL (ref 3.87–5.11)

## 2011-05-28 LAB — ETHANOL: Alcohol, Ethyl (B): <11 mg/dL (ref 0–11)

## 2011-05-28 LAB — RAPID URINE DRUG SCREEN, HOSP PERFORMED: Barbiturates: POSITIVE — AB

## 2011-05-28 MED ORDER — ALUM & MAG HYDROXIDE-SIMETH 200-200-20 MG/5ML PO SUSP: 30.0000 mL | ORAL | Status: DC | PRN

## 2011-05-28 MED ORDER — DICYCLOMINE HCL 20 MG PO TABS
20.0000 mg | ORAL_TABLET | Freq: Three times a day (TID) | ORAL | Status: DC
  Administered 2011-05-29 – 2011-06-06 (×32): 20 mg via ORAL
  Filled 2011-05-28 (×43): qty 1

## 2011-05-28 MED ORDER — ACETAMINOPHEN 325 MG PO TABS: 650.0000 mg | ORAL_TABLET | Freq: Four times a day (QID) | ORAL | Status: DC | PRN

## 2011-05-28 MED ORDER — TRAMADOL HCL 50 MG PO TABS
50.0000 mg | ORAL_TABLET | Freq: Two times a day (BID) | ORAL | Status: DC | PRN
  Administered 2011-05-29 – 2011-06-05 (×3): 50 mg via ORAL
  Filled 2011-05-28 (×4): qty 1

## 2011-05-28 MED ORDER — HYDROCHLOROTHIAZIDE 12.5 MG PO CAPS
12.5000 mg | ORAL_CAPSULE | Freq: Every day | ORAL | Status: DC
  Administered 2011-05-29 – 2011-06-05 (×8): 12.5 mg via ORAL
  Filled 2011-05-28 (×12): qty 1

## 2011-05-28 MED ORDER — ONDANSETRON HCL 4 MG PO TABS
4.0000 mg | ORAL_TABLET | Freq: Three times a day (TID) | ORAL | Status: DC | PRN
  Administered 2011-05-28: 4 mg via ORAL
  Filled 2011-05-28: qty 1

## 2011-05-28 MED ORDER — MAGNESIUM HYDROXIDE 400 MG/5ML PO SUSP
30.0000 mL | Freq: Every day | ORAL | Status: DC | PRN
  Administered 2011-06-02: 30 mL via ORAL

## 2011-05-28 MED ORDER — ZOLPIDEM TARTRATE 5 MG PO TABS: 5.0000 mg | ORAL_TABLET | Freq: Every evening | ORAL | Status: DC | PRN

## 2011-05-28 MED ORDER — TRAZODONE HCL 50 MG PO TABS
50.0000 mg | ORAL_TABLET | Freq: Every evening | ORAL | Status: DC | PRN
  Administered 2011-05-29 – 2011-06-05 (×6): 50 mg via ORAL
  Filled 2011-05-28 (×7): qty 1

## 2011-05-28 MED ORDER — CHOLESTYRAMINE LIGHT 4 G PO PACK
4.0000 g | PACK | ORAL | Status: DC
  Administered 2011-05-29 – 2011-06-04 (×17): 4 g via ORAL
  Filled 2011-05-28 (×25): qty 1

## 2011-05-28 MED ORDER — PANTOPRAZOLE SODIUM 40 MG PO TBEC
80.0000 mg | DELAYED_RELEASE_TABLET | Freq: Every day | ORAL | Status: DC
  Administered 2011-05-29 – 2011-06-06 (×8): 80 mg via ORAL
  Filled 2011-05-28 (×10): qty 2

## 2011-05-28 MED ORDER — ACETAMINOPHEN 325 MG PO TABS
650.0000 mg | ORAL_TABLET | ORAL | Status: DC | PRN
  Administered 2011-05-28: 650 mg via ORAL
  Filled 2011-05-28: qty 2

## 2011-05-28 MED ORDER — PROMETHAZINE HCL 25 MG PO TABS
12.5000 mg | ORAL_TABLET | Freq: Four times a day (QID) | ORAL | Status: DC | PRN
  Administered 2011-05-29 – 2011-05-31 (×3): 12.5 mg via ORAL
  Filled 2011-05-28 (×3): qty 1

## 2011-05-28 MED ORDER — DULOXETINE HCL 60 MG PO CPEP
60.0000 mg | ORAL_CAPSULE | Freq: Every day | ORAL | Status: DC
  Administered 2011-05-29 – 2011-06-06 (×9): 60 mg via ORAL
  Filled 2011-05-28 (×14): qty 1

## 2011-05-28 MED ORDER — LISINOPRIL 10 MG PO TABS
10.0000 mg | ORAL_TABLET | Freq: Every day | ORAL | Status: DC
  Administered 2011-05-29 – 2011-06-05 (×7): 10 mg via ORAL
  Filled 2011-05-28 (×12): qty 1

## 2011-05-28 MED ORDER — LORAZEPAM 1 MG PO TABS
1.0000 mg | ORAL_TABLET | Freq: Three times a day (TID) | ORAL | Status: DC | PRN
  Administered 2011-05-28: 1 mg via ORAL
  Filled 2011-05-28: qty 1
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
## 2011-05-28 NOTE — ED Notes (Signed)
Pt states she has been depressed all of her life.  Felt suicidal two days ago with plan [place] [address].  Denies SI now.  No HI.  Pt very tearful during assessment.
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
## 2011-05-28 NOTE — ED Notes (Signed)
Pt sent here from [facility] due to being appropriate for their facility (Hx daily vomiting/diarrhea s/p gallbladder surgery 2 mo ago). Pt sts she has been depressed and walked [place] bridge [place] [address] on Sunday with plan to jump, but wanted to live for her daughter "I need help, I don't want to die." Previous SA in [place] by injecting poison per IVC papers. [employer] present.

## 2011-05-28 NOTE — ED Provider Notes (Signed)
History     CSN: [id number]  Arrival date & time 05/28/11  1247   First MD Initiated Contact with Patient 05/28/11 1408      Chief Complaint  Patient presents with  . Medical Clearance  . Suicidal    (Consider location/radiation/quality/duration/timing/severity/associated sxs/prior treatment) HPI Comments: Patient comes in today with a chief complaint of suicidal thoughts.  She reports that she has been depressed for a long time and has been having suicidal thoughts over the past week.  Two days ago she went to a bridge and had thoughts of jumping off of the bridge.  She reports that she began thinking about her daughter and decided not to jump.  She reports that she does not have a Psychiatrist at this time.  She denies any alcohol or recreational drug use.  Denies HI.  Denies any AH or VH.   She reports prior suicide attempt 12 years ago.  She tried to inject poison into her arm.  She is also complaining of chronic abdominal pain that has been present for the last few months.  No change in pain.  Denies nausea, vomiting, or diarrhea.  The history is provided by the patient. The history is limited by a language barrier. A language interpreter was used (phone interpretor used).    Past Medical History  Diagnosis Date  . Dysrhythmia     not followed by cardiogist  . Anemia   . Blood transfusion   . Seizures   . Headache   . Depression   . Hypertension     Dr. Knox Royalty [phone]  . Abdominal pain     takes omeprazole    Past Surgical History  Procedure Date  . Abdominal hysterectomy   . Cesarean section     x2  . Abdominoplasty   . Cervical spine surgery     2002  . Shoulder surgery     left humeral head replacement 2001  . Breast surgery     breast implants x 2  . Cholecystectomy 02/28/2011    Procedure: LAPAROSCOPIC CHOLECYSTECTOMY;  Surgeon: Clovis Pu. Cornett, MD;  Location: [facility] OR;  Service: General;  Laterality: N/A;  Laparoscopic cholecystectomy.    Family  History  Problem Relation Age of Onset  . Cancer Mother     colon, skin  . Anesthesia problems Mother   . Heart disease Father     History  Substance Use Topics  . Smoking status: Never Smoker   . Smokeless tobacco: Never Used  . Alcohol Use: No    OB History    Grav Para Term Preterm Abortions TAB SAB Ect Mult Living                  Review of Systems  Constitutional: Negative for fever and chills.  Respiratory: Negative for shortness of breath.   Gastrointestinal: Positive for abdominal pain. Negative for nausea, vomiting and diarrhea.  Musculoskeletal: Negative for gait problem.  Psychiatric/Behavioral: Positive for suicidal ideas and dysphoric mood. Negative for hallucinations, confusion, self-injury and agitation. The patient is nervous/anxious.     Allergies  Latex and Naproxen  Home Medications     BP 135/92  Pulse 95  Temp(Src) 98.7 F (37.1 C) (Oral)  Resp 18  SpO2 100%  Physical Exam  Nursing note and vitals reviewed. Constitutional: She appears well-developed and well-nourished. No distress.  HENT:  Head: Normocephalic and atraumatic.  Mouth/Throat: Oropharynx is clear and moist.  Cardiovascular: Normal rate, regular rhythm and normal heart sounds.  Pulmonary/Chest: Effort normal and breath sounds normal.  Abdominal: Soft. Bowel sounds are normal. She exhibits no mass. There is no rigidity, no rebound and no guarding.       Mild generalized abdominal pain  Neurological: She is alert.  Skin: Skin is warm and dry. She is not diaphoretic.  Psychiatric: Her speech is normal and behavior is normal. Thought content is not paranoid and not delusional. Cognition and memory are normal. She exhibits a depressed mood. She expresses suicidal ideation. She expresses no homicidal ideation. She expresses suicidal plans. She expresses no homicidal plans.       Patient tearful    ED Course  Procedures (including critical care time)  Labs Reviewed    COMPREHENSIVE METABOLIC PANEL - Abnormal; Notable for the following:    ALT 61 (*)    All other components within normal limits  URINE RAPID DRUG SCREEN (HOSP PERFORMED) - Abnormal; Notable for the following:    Barbiturates POSITIVE (*)    All other components within normal limits  CBC  ETHANOL  POCT PREGNANCY, URINE   No results found.   No diagnosis found.  Patient discussed with Dr. Effie Shy.  Discussed patient with ACT team.  They report that they will come evaluate patient in the Ed.  MDM  Patient with suicidal thoughts and plan.  Patient with prior suicide attempt years ago.  Patient discussed with ACT team who reports that they will come evaluate patient.        Pascal Lux [place], PA-C 05/29/11 1128

## 2011-05-28 NOTE — ED Notes (Signed)
Pt has been accepted to [facility] by Dr. Emmit Pomfret to Dr. Dan Humphreys bed [phone]. EDP notified and is in agreement with disposition. RN made aware to call report. All support paper work has been completed and faxed to [facility] for review. No further CSW needs identified at this time.

## 2011-05-29 ENCOUNTER — Encounter (HOSPITAL_COMMUNITY): Payer: Self-pay | Admitting: *Deleted

## 2011-05-29 DIAGNOSIS — F4310 Post-traumatic stress disorder, unspecified: Secondary | ICD-10-CM

## 2011-05-29 DIAGNOSIS — F079 Unspecified personality and behavioral disorder due to known physiological condition: Secondary | ICD-10-CM | POA: Diagnosis present

## 2011-05-29 DIAGNOSIS — F411 Generalized anxiety disorder: Secondary | ICD-10-CM

## 2011-05-29 MED ORDER — CARBAMAZEPINE 200 MG PO TABS
200.0000 mg | ORAL_TABLET | Freq: Three times a day (TID) | ORAL | Status: DC
  Administered 2011-05-29 – 2011-06-03 (×14): 200 mg via ORAL
  Filled 2011-05-29 (×25): qty 1
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
## 2011-05-29 NOTE — Progress Notes (Signed)
Pt denies SI/HI/AVH. Pt has been somewhat seclusive today, yet cooperative. Pt mood depressed. Pt given Voluntary Admission form to sign. Support and encouragement offered. Pt receptive.
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
## 2011-05-29 NOTE — Progress Notes (Signed)
Patient reports that she slept poorly and has a low energy level.  Rates depression at 7/10 and hopelessness at a 3/10.  Reports feeling very depressed, but has no suicidal ideation.  Had episode of vomiting relieved by po Phenergan.  Patient stayed in room, resting between groups.  Safety maintained on unit

## 2011-05-29 NOTE — Tx Team (Signed)
Initial Interdisciplinary Treatment Plan  PATIENT STRENGTHS: (choose at least two) Ability for insight Average or above average intelligence Capable of independent living General fund of knowledge Motivation for treatment/growth Supportive family/friends  PATIENT STRESSORS: Educational concerns Health problems   PROBLEM LIST: Problem List/Patient Goals Date to be addressed Date deferred Reason deferred Estimated date of resolution  Depression      Declining health issues-chronic diarrhea/chronic abd pain      Risk for self harm      Decreased focus/concentration                                     DISCHARGE CRITERIA:  Ability to meet basic life and health needs Improved stabilization in mood, thinking, and/or behavior Medical problems require only outpatient monitoring Motivation to continue treatment in a less acute level of care Verbal commitment to aftercare and medication compliance  PRELIMINARY DISCHARGE PLAN: Attend aftercare/continuing care group Outpatient therapy Participate in family therapy Return to previous living arrangement  PATIENT/FAMIILY INVOLVEMENT: This treatment plan has been presented to and reviewed with the patient, [name], and/or family member.  The patient and family have been given the opportunity to ask questions and make suggestions.  Jesus Genera [facility] 05/29/2011, 1:18 AM

## 2011-05-29 NOTE — Tx Team (Signed)
Interdisciplinary Treatment Plan Update (Adult)  Date:  05/29/2011  Time Reviewed:  10:28 AM   Progress in Treatment: Attending groups:   Yes   Participating in groups:  Yes Taking medication as prescribed:  Yes Tolerating medication:  Yes Family/Significant othe contact made: Contact can be made with family Patient understands diagnosis:  Yes Discussing patient identified problems/goals with staff: Yes Medical problems stabilized or resolved: Yes Denies suicidal/homicidal ideation:Yes Issues/concerns per patient self-inventory:  Other:  New problem(s) identified:  Reason for Continuation of Hospitalization: Anxiety Depression Medication stabilization  Interventions implemented related to continuation of hospitalization:  Medication Management; safety checks q 15 mins  Additional comments:  Estimated length of stay:  Discharge Plan:  New goal(s):  Review of initial/current patient goals per problem list:    1.  Goal(s):  Eliminate SI/thoughts of self harm  Met:  Yes  Target date: d/c  As evidenced by:  Patient will deny SI other thoughts of self harm  2.  Goal (s):  Reduce depression/other symptoms (Currently rated at ten)  Met:  No  Target date: d/c  As evidenced by:  Patient will rate symptoms at four or below  3.  Goal(s):  Stabilize on medications  Met:  No  Target date: d/c  As evidenced by:  Patient will report medications are working - symptoms decreased  4.  Goal(s):  Refer for outpatient follow up  Met:  No  Target date:  d/c  As evidenced by:  Follow up appointment will be in place  Attendees: Patient:     Family:     Physician:  Orson Aloe, MD 05/29/2011 10:28 AM   Nursing:   Baird Cancer, RN 05/29/2011 10:28 AM   CaseManager:  Juline Patch, LCSW 05/29/2011 10:28 AM   Counselor:  Angus Palms, LCSW 05/29/2011 10:28 AM   Other:  Serena Colonel, NP 05/29/2011 10:28 AM   Other:  Reyes Ivan, LCSWA 05/29/2011  10:28 AM   Other:   Chinita Greenland, RN 05/29/2011  10:35 AM  Other:      Scribe for Treatment Team:   Wynn Banker, LCSW,  05/29/2011 10:28 AM
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
## 2011-05-30 NOTE — Progress Notes (Signed)
Patient ID: [name], female   DOB: [birth year], 41 y.o.   MRN: [id number]   Patient appears to be sleeping. Respirations even and non-labored. Staff will monitor on q 15 minute checks.

## 2011-05-30 NOTE — Discharge Planning (Signed)
Patient attended Aftercare Planning Group with another Case Manager and they discussed safety planning.  No case management needs today.  Ambrose Mantle, LCSW 05/30/2011, 4:59 PM

## 2011-05-30 NOTE — Progress Notes (Addendum)
[facility] Group Notes:  (Counselor/Nursing/MHT/Case Management/Adjunct) 05/30/2011  11:00am [facility]   Type of Therapy:  Group Therapy  Participation Level:  Did Not Attend     [name] 05/30/2011  3:26 PM      [facility] Group Notes: (Counselor/Nursing/MHT/Case Management/Adjunct) 05/30/2011   @1 :15pm Art Therapy: The Mask I Wear  Type of Therapy:  Group Therapy  Participation Level:  Minimal  Participation Quality: Somewhat Attentive  Affect:  Blunted  Cognitive:  Appropriate  Insight:  NOne  Engagement in Group: Minimal  Engagement in Therapy:  None  Modes of Intervention:  Support and Exploration  Summary of Progress/Problems: [name] came to group late and did not engage  [name] 05/30/2011  3:27 PM

## 2011-05-30 NOTE — H&P (Signed)
Medical/psychiatric screening examination/treatment/procedure(s) were performed by non-physician practitioner and as supervising physician I was immediately available for consultation/collaboration.  I have seen and examined this patient and agree the major elements of this evaluation.  

## 2011-05-30 NOTE — Progress Notes (Signed)
Patient is rating depression 5/10 and hopelessness 5/10.  States that she feels better overall, but she did not go to morning group.  No report of SI/HI.  States that her stomach is better and she has not had any diarrhea.  Safety maintained on unit.

## 2011-05-30 NOTE — Progress Notes (Signed)
PT denies SI/HI/AVH. Pt rates her depression and hopelessness both as 5. Pt states that she has been feeling better because she is now able to sleep with the medication that she is being given. Support and encouragement offered. Pt receptive.

## 2011-05-30 NOTE — ED Provider Notes (Signed)
Medical screening examination/treatment/procedure(s) were performed by non-physician practitioner and as supervising physician I was immediately available for consultation/collaboration.  Flint Melter, MD 05/30/11 [phone]

## 2011-05-31 DIAGNOSIS — F419 Anxiety disorder, unspecified: Secondary | ICD-10-CM | POA: Diagnosis present

## 2011-05-31 MED ORDER — CHLORPROMAZINE HCL 25 MG PO TABS
25.0000 mg | ORAL_TABLET | Freq: Three times a day (TID) | ORAL | Status: DC
  Administered 2011-05-31 – 2011-06-03 (×9): 25 mg via ORAL
  Filled 2011-05-31 (×18): qty 1

## 2011-05-31 MED ORDER — ASPIRIN-ACETAMINOPHEN-CAFFEINE 250-250-65 MG PO TABS
2.0000 | ORAL_TABLET | Freq: Four times a day (QID) | ORAL | Status: DC | PRN
  Administered 2011-06-02: 2 via ORAL
  Filled 2011-05-31 (×2): qty 2

## 2011-05-31 MED ORDER — PROPRANOLOL HCL 10 MG PO TABS
10.0000 mg | ORAL_TABLET | Freq: Two times a day (BID) | ORAL | Status: DC
  Administered 2011-05-31 – 2011-06-05 (×6): 10 mg via ORAL
  Filled 2011-05-31 (×16): qty 1

## 2011-05-31 NOTE — Progress Notes (Signed)
Pt. Very pleasant and cooperative. Stated she was having some anxious feelings and wanted to tka her meds a little early. Pt. Has been in the dayroom with the other pts. Contracts for safety. Denis Si or HI.
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
## 2011-05-31 NOTE — Tx Team (Signed)
/  Interdisciplinary Treatment Plan Update (Adult)  Date:  05/31/2011  Time Reviewed:  11:08 AM   Progress in Treatment: Attending groups:   Yes   Participating in groups:  Yes Taking medication as prescribed:  Yes Tolerating medication:  Yes Family/Significant othe contact made:  Patient understands diagnosis:  Yes Discussing patient identified problems/goals with staff: Yes Medical problems stabilized or resolved: Yes Denies suicidal/homicidal ideation:Yes Issues/concerns per patient self-inventory:  Other:  New problem(s) identified:  Reason for Continuation of Hospitalization: Anxiety Medication stabilization  Interventions implemented related to continuation of hospitalization:  Medication Management; safety checks q 15 mins  Additional comments:  Estimated length of stay:  2-3 days  Discharge Plan:  Home with outpatient follow up  New goal(s):  Review of initial/current patient goals per problem list:    1.  Goal(s):  .Eliminate SI/thoughts of self harm  Met:  Yes  Target date: d/c  As evidenced by:  Patient is no longer endorsing Si  2.  Goal (s):  Reduce depression/anxiety (rated at ten on admission)  Met:  Yes  Target date:  d/c  As evidenced by: Patient will rate symptoms at four or below (rated depression at four and anxiety at six today)  3.  Goal(s):  Stabilize on medication  Met:  No  Target date: d/c  As evidenced by:  Patient will reports symptom improvement  4.  Goal(s):  Refer for outpatient follow up  Met:  Yes  Target date: d/c  As evidenced by:  Follow up appointments scheduled  Attendees: Patient:  [name] 05/31/2011  11:13 AM  Family:     Physician:  Orson Aloe, MD 05/31/2011 11:08 AM   Nursing:   Lamount Cranker, RN 05/31/2011 11:08 AM   CaseManager:  Juline Patch, LCSW 05/31/2011 11:08 AM   Counselor:  Angus Palms, LCSW 05/31/2011 11:08 AM   Other:  05/31/2011 11:08 AM   Other:  Reyes Ivan, LCSWA 05/31/2011   11:08 AM   Other:     Other:      Scribe for Treatment Team:   Wynn Banker, LCSW,  05/31/2011 11:08 AM   \\\

## 2011-05-31 NOTE — Progress Notes (Signed)
Patient seen during during d/c planning group and or treatment team.  She reports doing better today and sleeping well. She denies SI and rates depression, hopelessness and helplessness at three.  She rates anxiety at six.  Patient shared that she is interested in having outpatient counseling at discharge.  MD to make adjustments to medication for anxiety.

## 2011-05-31 NOTE — Progress Notes (Signed)
[facility] Group Notes: (Counselor/Nursing/MHT/Case Management/Adjunct) 05/31/2011   @11 :00am Preventing Relapse  Type of Therapy:  Group Therapy  Participation Level:  None  Participation Quality:  attentive  Affect:  Blunted  Cognitive:  Appropriate  Insight:  none  Engagement in Group: minimal  Engagement in Therapy:  none  Modes of Intervention:  Support and Exploration  Summary of Progress/Problems: [name] came to group late and was not very engaged, though she seemed attentive.  [name] 05/31/2011 4:28 PM     [facility] Group Notes: (Counselor/Nursing/MHT/Case Management/Adjunct) 05/31/2011   @1 :15pm  Type of Therapy:  Group Therapy  Participation Level:  Good  Participation Quality:  Good  Affect:  Appropriate  Cognitive:  Appropriate  Insight:  Good  Engagement in Group:  Good  Engagement in Therapy:  Good  Modes of Intervention:  Support and Exploration  Summary of Progress/Problems:  Any participated with speaker from [facility] [employer] [facility] [employer] and expressed interest in programs [facility] offers.   [name] 05/31/2011 4:30 PM

## 2011-06-01 NOTE — Progress Notes (Signed)
Patient ID: [name], female   DOB: Jan 06, 1971, 41 y.o.   MRN: [id number] Pt. In bed, eyes closed, resp. Even, unlabored, no distress noted. Staff will monitor q60min for safety.
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
## 2011-06-01 NOTE — Progress Notes (Signed)
[facility] Group Notes:  (Counselor/Nursing/MHT/Case Management/Adjunct)  06/01/2011 1315  Type of Therapy:  Group Therapy  Participation Level:  Did Not Attend  CROSSWELL, DESIREE L 06/01/2011, 5:32 PM

## 2011-06-01 NOTE — Progress Notes (Signed)
  Alaira Level is a 41 y.o. 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 Still feels dizzy at times. Denies SI/HI/AVH.  Subjective/Objective:  Tegretol level is normal and she may still be adjusting to the Thorazine.   Filed Vitals:   06/01/11 0913  BP: 100/71  Pulse: 97  Temp:   Resp:     Lab Results:   BMET    Component Value Date/Time   NA 140 05/28/2011 1345   K 3.7 05/28/2011 1345   CL 104 05/28/2011 1345   CO2 27 05/28/2011 1345   GLUCOSE 99 05/28/2011 1345   BUN 10 05/28/2011 1345   CREATININE 0.60 05/28/2011 1345   CREATININE 0.70 03/08/2011 1123   CALCIUM 9.4 05/28/2011 1345   GFRNONAA >90 05/28/2011 1345   GFRAA >90 05/28/2011 1345    Medications:  Scheduled:     . carbamazepine  200 mg Oral TID  . chlorproMAZINE  25 mg Oral TID  . cholestyramine light  4 g Oral Custom  . dicyclomine  20 mg Oral TID AC & HS  . DULoxetine  60 mg Oral Daily  . hydrochlorothiazide  12.5 mg Oral Daily  . lisinopril  10 mg Oral Daily  . pantoprazole  80 mg Oral Q1200  . propranolol  10 mg Oral BID     PRN Meds acetaminophen, alum & mag hydroxide-simeth, aspirin-acetaminophen-caffeine, magnesium hydroxide, promethazine, traMADol, traZODone  Plan; Continue present plan of care.  Shermaine Rivet,MICKIE D. 06/01/2011

## 2011-06-01 NOTE — Progress Notes (Signed)
Patient ID: [name], female   DOB: [birth year], 41 y.o.   MRN: [id number] Pt. Attends group, appears relaxed participated. Pt. Then reports stomach pain at "8" of 10. Pt. Reports that pain is chronic, "have all the time." Pt. Reports that pain is tolerable at "3-4" and even with meds that's all she gets. Staff will continue to monitor q67min for safety.

## 2011-06-02 ENCOUNTER — Inpatient Hospital Stay (HOSPITAL_COMMUNITY): Payer: Medicaid Other

## 2011-06-02 ENCOUNTER — Encounter (HOSPITAL_COMMUNITY): Payer: Self-pay | Admitting: *Deleted

## 2011-06-02 LAB — DIFFERENTIAL
Eosinophils Relative: 1 % (ref 0–5)
Lymphocytes Relative: 39 % (ref 12–46)
Monocytes Absolute: 0.3 10*3/uL (ref 0.1–1.0)
Monocytes Relative: 7 % (ref 3–12)
Neutro Abs: 2.4 10*3/uL (ref 1.7–7.7)

## 2011-06-02 LAB — COMPREHENSIVE METABOLIC PANEL
BUN: 13 mg/dL (ref 6–23)
CO2: 28 mEq/L (ref 19–32)
Calcium: 9.1 mg/dL (ref 8.4–10.5)
Chloride: 95 mEq/L — ABNORMAL LOW (ref 96–112)
Creatinine, Ser: 0.59 mg/dL (ref 0.50–1.10)
GFR calc Af Amer: >90 mL/min (ref >90)
GFR calc non Af Amer: >90 mL/min (ref >90)
Glucose, Bld: 92 mg/dL (ref 70–99)
Total Bilirubin: 0.4 mg/dL (ref 0.3–1.2)

## 2011-06-02 LAB — URINALYSIS, ROUTINE W REFLEX MICROSCOPIC
Glucose, UA: NEGATIVE mg/dL
Ketones, ur: NEGATIVE mg/dL
Leukocytes, UA: NEGATIVE
Nitrite: NEGATIVE
Protein, ur: NEGATIVE mg/dL
Urobilinogen, UA: 0.2 mg/dL (ref 0.0–1.0)

## 2011-06-02 LAB — LIPASE, BLOOD: Lipase: 35 U/L (ref 11–59)

## 2011-06-02 LAB — CBC
HCT: 39.6 % (ref 36.0–46.0)
Hemoglobin: 13.3 g/dL (ref 12.0–15.0)
MCV: 96.6 fL (ref 78.0–100.0)
RDW: 13.3 % (ref 11.5–15.5)
WBC: 4.5 10*3/uL (ref 4.0–10.5)

## 2011-06-02 MED ORDER — POTASSIUM CHLORIDE 20 MEQ/15ML (10%) PO LIQD
10.0000 meq | Freq: Once | ORAL | Status: AC
  Administered 2011-06-02: 10 meq via ORAL
  Filled 2011-06-02: qty 15

## 2011-06-02 MED ORDER — SODIUM CHLORIDE 0.9 % IV BOLUS (SEPSIS)
1000.0000 mL | Freq: Once | INTRAVENOUS | Status: AC
  Administered 2011-06-02 (×2): 1000 mL via INTRAVENOUS

## 2011-06-02 MED ORDER — ONDANSETRON HCL 4 MG/2ML IJ SOLN
4.0000 mg | Freq: Once | INTRAMUSCULAR | Status: AC
  Administered 2011-06-02: 4 mg via INTRAVENOUS
  Filled 2011-06-02: qty 2

## 2011-06-02 MED ORDER — IOHEXOL 300 MG/ML  SOLN
100.0000 mL | Freq: Once | INTRAMUSCULAR | Status: AC | PRN
  Administered 2011-06-02: 100 mL via INTRAVENOUS

## 2011-06-02 MED ORDER — SODIUM CHLORIDE 0.9 % IV SOLN
Freq: Once | INTRAVENOUS | Status: AC
  Administered 2011-06-02: 17:00:00 via INTRAVENOUS

## 2011-06-02 NOTE — Progress Notes (Signed)
Patient ID: [name], female   DOB: [birth year], 41 y.o.   MRN: [id number] The patient is resting in bed with eyes closed. No distress noted. 15 minute checks maintained for safety.

## 2011-06-02 NOTE — Progress Notes (Signed)
  [name] is a 41 y.o. 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 C/O abdominal pain. Abdomen is slightly tense does have a swelling L side of abdomen also c/o severe anal discomfort known to have an anal fissure says last BM 2 days. ago.   Filed Vitals:   06/02/11 0723  BP: 101/65  Pulse: 86  Temp:   Resp:     Lab Results:   BMET    Component Value Date/Time   NA 140 05/28/2011 1345   K 3.7 05/28/2011 1345   CL 104 05/28/2011 1345   CO2 27 05/28/2011 1345   GLUCOSE 99 05/28/2011 1345   BUN 10 05/28/2011 1345   CREATININE 0.60 05/28/2011 1345   CREATININE 0.70 03/08/2011 1123   CALCIUM 9.4 05/28/2011 1345   GFRNONAA >90 05/28/2011 1345   GFRAA >90 05/28/2011 1345    Medications:  Scheduled:     . carbamazepine  200 mg Oral TID  . chlorproMAZINE  25 mg Oral TID  . cholestyramine light  4 g Oral Custom  . dicyclomine  20 mg Oral TID AC & HS  . DULoxetine  60 mg Oral Daily  . hydrochlorothiazide  12.5 mg Oral Daily  . lisinopril  10 mg Oral Daily  . pantoprazole  80 mg Oral Q1200  . propranolol  10 mg Oral BID     PRN Meds acetaminophen, alum & mag hydroxide-simeth, aspirin-acetaminophen-caffeine, magnesium hydroxide, promethazine, traMADol, traZODone  Plan: will transfer to Ed at [facility] for eval and treatment of abdominal pain.   Tiegan Terpstra,MICKIE D. 06/02/2011

## 2011-06-02 NOTE — Progress Notes (Signed)
Pt returned from [facility] and states that she is still feeling discomfort, but overall feels better. Report was received that Pt is constipated and will need a laxative. Affect is bright, mood less depressed. Stated she was hungry and was given dinner. Also given prune juice to help with the constipation. Denies SI and HI.

## 2011-06-02 NOTE — ED Notes (Signed)
Abdominal pain onset this a.m. After breakfast. Diarrhea since unknown surgical event January 31, nausea all the time. Dizzy for past several days.

## 2011-06-02 NOTE — ED Provider Notes (Signed)
History     CSN: [id number]  Arrival date & time 06/02/11  1200   First MD Initiated Contact with Patient 06/02/11 1238      Chief Complaint  Patient presents with  . Abdominal Pain  . Emesis  . Diarrhea    (Consider location/radiation/quality/duration/timing/severity/associated sxs/prior treatment) HPI  40yoF since with abdominal pain. The patient states that she's had constant lower abdominal pain greater on the left side for the past 2 days. She states she's had similar pain over the past one year after having her gallbladder removed. She describes it as sharp. It is no radiation to her back. Denies hematuria/dysuria/freq/urgency. She endorses nausea and one episode of nonbilious nonbloody emesis last night. She denies fevers, chills. Patient was transferred from behavioral health and they have not noted fever in the past few days. Decreased by mouth intake and she feels that she is dehydrated. She states she diarrhea 2 days ago which is resolved. Denies vaginal discharge. Transferred from behavioral health for further w/u and evaluation.  Abdominal surgeries include cholecystectomy, hysterectomy, tummy tuck, herniorrhaphy   Past Medical History  Diagnosis Date  . Dysrhythmia     not followed by cardiogist  . Anemia   . Blood transfusion   . Seizures   . Headache   . Depression   . Hypertension     Dr. Knox Royalty [phone]  . Abdominal pain     takes omeprazole    Past Surgical History  Procedure Date  . Abdominal hysterectomy   . Cesarean section     x2  . Abdominoplasty   . Cervical spine surgery     2002  . Shoulder surgery     left humeral head replacement 2001  . Breast surgery     breast implants x 2  . Cholecystectomy 02/28/2011    Procedure: LAPAROSCOPIC CHOLECYSTECTOMY;  Surgeon: Clovis Pu. Cornett, MD;  Location: [facility] OR;  Service: General;  Laterality: N/A;  Laparoscopic cholecystectomy.    Family History  Problem Relation Age of Onset  . Cancer  Mother     colon, skin  . Anesthesia problems Mother   . Heart disease Father     History  Substance Use Topics  . Smoking status: Never Smoker   . Smokeless tobacco: Never Used  . Alcohol Use: No    OB History    Grav Para Term Preterm Abortions TAB SAB Ect Mult Living                  Review of Systems  All other systems reviewed and are negative.  except as noted HPI   Allergies  Latex and Naproxen  Home Medications     BP 120/75  Pulse 68  Temp(Src) 97.5 F (36.4 C) (Oral)  Resp 16  Ht 5\' 4"  (1.626 m)  Wt 157 lb (71.215 kg)  BMI 26.95 kg/m2  SpO2 100%  Physical Exam  Nursing note and vitals reviewed. Constitutional: She is oriented to person, place, and time. She appears well-developed.  HENT:  Head: Atraumatic.  Mouth/Throat: Oropharynx is clear and moist.  Eyes: Conjunctivae and EOM are normal. Pupils are equal, round, and reactive to light.  Neck: Normal range of motion. Neck supple.  Cardiovascular: Normal rate, regular rhythm, normal heart sounds and intact distal pulses.   Pulmonary/Chest: Effort normal and breath sounds normal. No respiratory distress. She has no wheezes. She has no rales.  Abdominal: Soft. She exhibits mass. She exhibits no distension. There is tenderness. There is  no rebound and no guarding.       Diffuse lower abd ttp > LLQ  LLQ hernia noted with standing only, not palpated when lying down supine  Genitourinary:       Deferred (no vaginal discharge, h/o hysterectomy)  Musculoskeletal: Normal range of motion.  Neurological: She is alert and oriented to person, place, and time.  Skin: Skin is warm and dry. No rash noted.  Psychiatric: She has a normal mood and affect.    ED Course  Procedures (including critical care time)   Labs Reviewed  CARBAMAZEPINE LEVEL, TOTAL  CARBAMAZEPINE LEVEL, TOTAL  CBC  DIFFERENTIAL  COMPREHENSIVE METABOLIC PANEL  LIPASE, BLOOD   No results found.   1. Unspecified nonpsychotic  mental disorder following organic brain damage   2. Anxiety disorder     MDM  Presents with lower abdominal pain. DDx incl colitis, UTI, diverticulitis, appendicitis, obstruction. H/o hysterectomy. Plan is for IVF, morphine, zofran, CT A/P, U/A, labs to evaluate. Reassess.   Labs reviewed and unremarkable. U/A pending. Anticipate transfer back to [facility] when resulted.        Forbes Cellar, MD 06/02/11 1555

## 2011-06-02 NOTE — Progress Notes (Signed)
[facility] Adult Inpatient Family/Significant Other Suicide Prevention Education  Suicide Prevention Education:  Education Completed; [name] [phone]- has been identified by the patient as the family member/significant other with whom the patient will be residing, and identified as the person(s) who will aid the patient in the event of a mental health crisis (suicidal ideations/suicide attempt).  With written consent from the patient, the family member/significant other has been provided the following suicide prevention education, prior to the and/or following the discharge of the patient.  The suicide prevention education provided includes the following:  Suicide risk factors  Suicide prevention and interventions  National Suicide Hotline telephone number  [facility] assessment telephone number  [facility] Emergency Assistance 911  [facility] and/or Residential Mobile Crisis Unit telephone number  Request made of family/significant other to:  Remove weapons (e.g., guns, rifles, knives), all items previously/currently identified as safety concern.  Pt.'s husband states there are no guns or weapons in the home.  Remove drugs/medications (over-the-counter, prescriptions, illicit drugs), all items previously/currently identified as a safety concern. Pt.'s husband has no concerns and will secure the home before d/c.  Pt.'s husband states that the pt.'s current mental health issues are new and reports no problems in the past. Pt. states that  The family is having financial difficulties. Pt.'s husband can be reached at the number above.  The family member/significant other verbalizes understanding of the suicide prevention education information provided.  The family member/significant other agrees to remove the items of safety concern listed above.  Neila Gear 06/02/2011, 8:54 AM

## 2011-06-02 NOTE — Discharge Instructions (Signed)
Abdominal Pain Abdominal pain can be caused by many things. Your caregiver decides the seriousness of your pain by an examination and possibly blood tests and X-rays. Many cases can be observed and treated at home. Most abdominal pain is not caused by a disease and will probably improve without treatment. However, in many cases, more time must pass before a clear cause of the pain can be found. Before that point, it may not be known if you need more testing, or if hospitalization or surgery is needed. HOME CARE INSTRUCTIONS   Do not take laxatives unless directed by your caregiver.   Take pain medicine only as directed by your caregiver.   Only take over-the-counter or prescription medicines for pain, discomfort, or fever as directed by your caregiver.   Try a clear liquid diet (broth, tea, or water) for as long as directed by your caregiver. Slowly move to a bland diet as tolerated.  SEEK IMMEDIATE MEDICAL CARE IF:   The pain does not go away.   You have a fever.   You keep throwing up (vomiting).   The pain is felt only in portions of the abdomen. Pain in the right side could possibly be appendicitis. In an adult, pain in the left lower portion of the abdomen could be colitis or diverticulitis.   You pass bloody or black tarry stools.  MAKE SURE YOU:   Understand these instructions.   Will watch your condition.   Will get help right away if you are not doing well or get worse.  Document Released: 10/24/2004 Document Revised: 01/03/2011 Document Reviewed: 09/02/2007 ExitCare Patient Information 2012 [employer]  RESOURCE GUIDE  Dental Problems  Patients with Medicaid: [facility]                     [facility] [address]                                           [address] Phone:  [phone]                                                   Phone:  [phone]  If unable to pay or uninsured, contact:  [facility] or [place]  [facility] to become qualified for the adult dental clinic.  Chronic Pain Problems Contact [facility]  [phone] Patients need to be referred by their primary care doctor.  Insufficient Money for Medicine Contact [employer] Way:  call "211" or [facility] 271-5999.  No Primary Care Doctor Call [facility]  832-8000 Other agencies that provide inexpensive medical care    [facility]  832-8035    [facility]  [phone]    [facility]  [phone]    Women's Clinic  832-4777    [facility]anned [facility]  [phone]    [facility]  272-1050  [facility]  832-9600 [facility]  [phone] [facility]   800 853-5163 (emergency services 641-4993)  Abuse/Neglect [place] Child Abuse Hotline (336) 641-3795 [place] Child Abuse Hotline 800-378-5315 (After Hours)  Emergency Shelter [facility] (336) 271-5985  Maternity Homes   [facility] (336) 275-9566 [facility] (704) 372-4663  MRSA Hotline #:   832-7006    [place] Resources  [facility] [place]  [employer]                           [place] [facility] [address] [place]                     [address]         [address]  [place]                                               [place]                              [place] Phone:  [phone]                                  Phone:  [phone]                   Phone:  [phone]  [facility] Phone:  342-8316  [place] Child Abuse Hotline (336) 342-1394 (336) 342-3537 (After Hours)  

## 2011-06-02 NOTE — ED Notes (Signed)
Pt is a pt at [facility], states she has had dizziness, N/V/D for 4 days, then states no vomiting or diarrhea in 2 days, c/o pain in left lower quad

## 2011-06-02 NOTE — Progress Notes (Signed)
Patient ID: [name], female   DOB: 1970/04/28, 41 y.o.   MRN: [id number] 06-02-11 nursing note:brooks rn called [name] to make him aware that his wife went to [place] to be assessed for abdominal pain. rn called [phone] and the ph # was disconnected.  rn called him and l/m on v/m at cell # [phone].

## 2011-06-02 NOTE — ED Notes (Signed)
Bed:WA23<BR> Expected date:<BR> Expected time:<BR> Means of arrival:<BR> Comments:<BR> closed

## 2011-06-03 DIAGNOSIS — F4310 Post-traumatic stress disorder, unspecified: Secondary | ICD-10-CM | POA: Diagnosis present

## 2011-06-03 MED ORDER — FLEET ENEMA 7-19 GM/118ML RE ENEM
1.0000 | ENEMA | Freq: Every day | RECTAL | Status: DC | PRN
  Administered 2011-06-03 – 2011-06-04 (×2): 1 via RECTAL
  Filled 2011-06-03 (×2): qty 1

## 2011-06-03 MED ORDER — DOCUSATE SODIUM 100 MG PO CAPS
100.0000 mg | ORAL_CAPSULE | Freq: Two times a day (BID) | ORAL | Status: DC
  Administered 2011-06-03 – 2011-06-06 (×5): 100 mg via ORAL
  Filled 2011-06-03 (×10): qty 1

## 2011-06-03 MED ORDER — CHLORPROMAZINE HCL 50 MG PO TABS
50.0000 mg | ORAL_TABLET | Freq: Three times a day (TID) | ORAL | Status: DC
  Administered 2011-06-03 – 2011-06-06 (×8): 50 mg via ORAL
  Filled 2011-06-03 (×13): qty 1

## 2011-06-03 MED ORDER — BISACODYL 10 MG RE SUPP
10.0000 mg | Freq: Once | RECTAL | Status: AC
  Administered 2011-06-03: 10 mg via RECTAL
  Filled 2011-06-03: qty 1

## 2011-06-03 MED ORDER — DIVALPROEX SODIUM ER 500 MG PO TB24
750.0000 mg | ORAL_TABLET | Freq: Every day | ORAL | Status: DC
  Administered 2011-06-03 – 2011-06-05 (×3): 750 mg via ORAL
  Filled 2011-06-03 (×5): qty 1

## 2011-06-03 NOTE — Tx Team (Signed)
Interdisciplinary Treatment Plan Update (Adult)  Date:  06/03/2011  Time Reviewed:  11:17 AM   Progress in Treatment: Attending groups:   Yes   Participating in groups:  Yes Taking medication as prescribed:  Yes Tolerating medication:  Yes Family/Significant othe contact made: Contact has been made with family Patient understands diagnosis:  Yes Discussing patient identified problems/goals with staff: Yes Medical problems stabilized or resolved: Yes Denies suicidal/homicidal ideation:Yes Issues/concerns per patient self-inventory:  Other:  New problem(s) identified:  Reason for Continuation of Hospitalization: Anxiety Depression Medication stabilization  Interventions implemented related to continuation of hospitalization:  Medication Management; safety checks q 15 mins  Additional comments:  Estimated length of stay: 1-2 days  Discharge Plan:  Home with outpatient follow up  New goal(s):  Review of initial/current patient goals per problem list:   1.  Goal(s): Eliminate SI/thoughts of self harm  Met:  Yes  Target date: d/c  As evidenced by:  Patient no longer endorse SI/thoughts of self-harm  2.  Goal (s):  Reduce symptoms of depression/anxiety (rated at five and seven/eight currently)  Met:  No  Target date: d/c  As evidenced by:  Patient will rate symptoms at four or below   3.  Goal(s):  Stabilize on medications  Met:  No  Target date: d/c  As evidenced by: Patient will report symptoms are stabilized  4.  Goal(s):  Refer for outpatient services  Met:  Yes  Target date:  d/c  As evidenced by: Outpatient follow up appointments scheduled  Attendees: Patient:     Family:     Physician:  Orson Aloe, MD 06/03/2011 11:17 AM   Nursing:   Barrie Folk 06/03/2011 11:17 AM   CaseManager:  Juline Patch, LCSW 06/03/2011 11:17 AM   Counselor:  Angus Palms, LCSW 06/03/2011 11:17 AM   Other:  Serena Colonel, NP 06/03/2011 11:17 AM   Other:  Reyes Ivan,  LCSWA 06/03/2011  11:17 AM   Other:  Berneice Heinrich, RN 06/03/2011  11:18 AM  Other:      Scribe for Treatment Team:   Wynn Banker, LCSW,  06/03/2011 11:17 AM

## 2011-06-03 NOTE — Progress Notes (Signed)
Patient interacting in groups and contributing appropriately. Patient verbalizes slept well last night. Patient describes appetite as poor, r/t constipation. Patient describes energy level as low and ability to pay attention as poor. Patient rates depression as 5/10 and hopelessness as 5/10. Patient denies SI/HI and A/V hallucinations. Patient states goal is to "visit Doctor and take medications" after discharge from [facility]. Patient given suppository for c/o constipation, no reports of BM.  Patient verbalizes no needs at this time. Will continue to monitor.

## 2011-06-03 NOTE — Progress Notes (Signed)
Patient ID: [name], female   DOB: Nov 27, 1970, 41 y.o.   MRN: [id number] The patient is pleasant and interacting appropriately in the milieu. C/O constipation. Given MOM and prune juice with no results yet. Compliant with medication.

## 2011-06-03 NOTE — Progress Notes (Addendum)
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  Appetite:  Fair, but pt had nausea and vomiting this afternoon  Suicidal Ideation:  Pt denies any suicidal thoughts. Homicidal Ideation:  Denies adamantly any homicidal thoughts.  Mental Status Examination/Evaluation: Objective:  Appearance: Casual  Eye Contact::  Good  Speech:  Clear and Coherent  Volume:  Normal  Mood:  Anxious and Irritable , describes numbness of her tongue and lips while breathing fast during the night.  This sounds like hyperventilation, will have staff offer her a paper bag in which to breath for this to see if this helps.   Affect:  Tearful  Thought Process:  Coherent, a little bit better, but the feeling that the ceiling is coming down on her when she is laying in bed trying to fall asleep has stopped.  This is a definite benefit from starting the Tegretol, however her staring spells have returned now back on Tegretol, will shift to Depakote for that.  Will see if that continues to help her focus and confused thinking while helping her staring spells and her random hand and leg jerks thought possibly related to seizures.  Orientation:  Full  Thought Content:  WDL  Suicidal Thoughts:  No  Homicidal Thoughts:  No  Memory:  Immediate;   Fair  Judgement:  Fair  Insight:  Fair  Psychomotor Activity:  Normal  Concentration:  Fair  Recall:  Fair  Akathisia:  No  Handed:  Right  AIMS (if indicated):     Assets:  Communication Skills Desire for Improvement Financial Resources/Insurance Housing  Sleep:  Number of Hours: 6.75    ROS: Neuro: migraine headaches are better on the Inderal and the Execdrine.  Her dizziness is less with more time on Tegretol.  She has absence type staring spells on the Tegretol, just like when she was on  Tegretol for 5 years in [place].  She has never been on Depakote,; will try that.  Anxiety is not improved even a little bit on the Throazine will push that dose.  She has random leg and hand jerks.  This could be random seizure activity.  MS: pt notes weakness in the hands, but no muscle cramps or aches.  GI: she had nausea and some vomiting reportedly observed by staff this afternoon.  She notes total constipation (no stool in a couple of days and no response to a suppository this AM), will order a fleets enema for her.  Vital Signs:Blood pressure 91/67, pulse 102, temperature 97.7 F (36.5 C), temperature source Oral, resp. rate 16, height 5\' 4"  (1.626 m), weight 71.215 kg (157 lb), SpO2 100.00%. Current Medications: Current Facility-Administered Medications  Medication Dose Route Frequency Provider Last Rate Last Dose  . acetaminophen (TYLENOL) tablet 650 mg  650 mg Oral Q6H PRN Curlene Labrum Readling, MD      . alum & mag hydroxide-simeth (MAALOX/MYLANTA) 200-200-20 MG/5ML suspension 30 mL  30 mL Oral Q4H PRN Curlene Labrum Readling, MD      . aspirin-acetaminophen-caffeine (EXCEDRIN MIGRAINE) per tablet 2 tablet  2 tablet Oral Q6H PRN Mike Craze, MD   2 tablet at 06/02/11 0630  . bisacodyl (DULCOLAX) suppository 10 mg  10 mg Rectal Once Sanjuana Kava, NP   10 mg at 06/03/11 0933  . chlorproMAZINE (THORAZINE) tablet  50 mg  50 mg Oral TID Mike Craze, MD      . cholestyramine light (PREVALITE) packet 4 g  4 g Oral Custom Curlene Labrum Readling, MD   4 g at 06/03/11 1400  . dicyclomine (BENTYL) tablet 20 mg  20 mg Oral TID AC & HS Curlene Labrum Readling, MD   20 mg at 06/03/11 1108  . divalproex (DEPAKOTE ER) 24 hr tablet 750 mg  750 mg Oral QHS Mike Craze, MD      . DULoxetine (CYMBALTA) DR capsule 60 mg  60 mg Oral Daily Curlene Labrum Readling, MD   60 mg at 06/03/11 0816  . hydrochlorothiazide (MICROZIDE) capsule 12.5 mg  12.5 mg Oral Daily Curlene Labrum Readling, MD   12.5 mg at 06/03/11 0815  . lisinopril  (PRINIVIL,ZESTRIL) tablet 10 mg  10 mg Oral Daily Curlene Labrum Readling, MD   10 mg at 06/03/11 0815  . magnesium hydroxide (MILK OF MAGNESIA) suspension 30 mL  30 mL Oral Daily PRN Curlene Labrum Readling, MD   30 mL at 06/02/11 2131  . pantoprazole (PROTONIX) EC tablet 80 mg  80 mg Oral Q1200 Curlene Labrum Readling, MD   80 mg at 06/03/11 1108  . promethazine (PHENERGAN) tablet 12.5 mg  12.5 mg Oral Q6H PRN Curlene Labrum Readling, MD   12.5 mg at 05/31/11 0953  . propranolol (INDERAL) tablet 10 mg  10 mg Oral BID Mike Craze, MD   10 mg at 06/03/11 0815  . sodium phosphate (FLEET) 7-19 GM/118ML enema 1 enema  1 enema Rectal Daily PRN Mike Craze, MD      . traMADol Janean Sark) tablet 50 mg  50 mg Oral BID PRN Ronny Bacon, MD   50 mg at 05/29/11 0051  . traZODone (DESYREL) tablet 50 mg  50 mg Oral QHS PRN Ronny Bacon, MD   50 mg at 06/02/11 2134  . DISCONTD: carbamazepine (TEGRETOL) tablet 200 mg  200 mg Oral TID Mike Craze, MD   200 mg at 06/03/11 1404  . DISCONTD: chlorproMAZINE (THORAZINE) tablet 25 mg  25 mg Oral TID Mike Craze, MD   25 mg at 06/03/11 1404    Lab Results:  Results for orders placed during the hospital encounter of 05/28/11 (from the past 48 hour(s))  CBC     Status: Normal   Collection Time   06/02/11  1:00 PM      Component Value Range Comment   WBC 4.5  4.0 - 10.5 (K/uL)    RBC 4.10  3.87 - 5.11 (MIL/uL)    Hemoglobin 13.3  12.0 - 15.0 (g/dL)    HCT 16.1  09.6 - 04.5 (%)    MCV 96.6  78.0 - 100.0 (fL)    MCH 32.4  26.0 - 34.0 (pg)    MCHC 33.6  30.0 - 36.0 (g/dL)    RDW 40.9  81.1 - 91.4 (%)    Platelets 250  150 - 400 (K/uL)   DIFFERENTIAL     Status: Normal   Collection Time   06/02/11  1:00 PM      Component Value Range Comment   Neutrophils Relative 53  43 - 77 (%)    Neutro Abs 2.4  1.7 - 7.7 (K/uL)    Lymphocytes Relative 39  12 - 46 (%)    Lymphs Abs 1.7  0.7 - 4.0 (K/uL)    Monocytes Relative 7  3 - 12 (%)    Monocytes  Absolute 0.3  0.1 - 1.0 (K/uL)     Eosinophils Relative 1  0 - 5 (%)    Eosinophils Absolute 0.0  0.0 - 0.7 (K/uL)    Basophils Relative 1  0 - 1 (%)    Basophils Absolute 0.0  0.0 - 0.1 (K/uL)   COMPREHENSIVE METABOLIC PANEL     Status: Abnormal   Collection Time   06/02/11  1:00 PM      Component Value Range Comment   Sodium 133 (*) 135 - 145 (mEq/L)    Potassium 3.4 (*) 3.5 - 5.1 (mEq/L)    Chloride 95 (*) 96 - 112 (mEq/L)    CO2 28  19 - 32 (mEq/L)    Glucose, Bld 92  70 - 99 (mg/dL)    BUN 13  6 - 23 (mg/dL)    Creatinine, Ser 8.29  0.50 - 1.10 (mg/dL)    Calcium 9.1  8.4 - 10.5 (mg/dL)    Total Protein 8.2  6.0 - 8.3 (g/dL)    Albumin 4.8  3.5 - 5.2 (g/dL)    AST 20  0 - 37 (U/L)    ALT 29  0 - 35 (U/L)    Alkaline Phosphatase 59  39 - 117 (U/L)    Total Bilirubin 0.4  0.3 - 1.2 (mg/dL)    GFR calc non Af Amer >90  >90 (mL/min)    GFR calc Af Amer >90  >90 (mL/min)   LIPASE, BLOOD     Status: Normal   Collection Time   06/02/11  1:00 PM      Component Value Range Comment   Lipase 35  11 - 59 (U/L)   URINALYSIS, ROUTINE W REFLEX MICROSCOPIC     Status: Normal   Collection Time   06/02/11  4:40 PM      Component Value Range Comment   Color, Urine YELLOW  YELLOW     APPearance CLEAR  CLEAR     Specific Gravity, Urine 1.019  1.005 - 1.030     pH 6.5  5.0 - 8.0     Glucose, UA NEGATIVE  NEGATIVE (mg/dL)    Hgb urine dipstick NEGATIVE  NEGATIVE     Bilirubin Urine NEGATIVE  NEGATIVE     Ketones, ur NEGATIVE  NEGATIVE (mg/dL)    Protein, ur NEGATIVE  NEGATIVE (mg/dL)    Urobilinogen, UA 0.2  0.0 - 1.0 (mg/dL)    Nitrite NEGATIVE  NEGATIVE     Leukocytes, UA NEGATIVE  NEGATIVE  MICROSCOPIC NOT DONE ON URINES WITH NEGATIVE PROTEIN, BLOOD, LEUKOCYTES, NITRITE, OR GLUCOSE <1000 mg/dL.    Physical Findings: AIMS:  , ,  ,  ,    CIWA:    COWS:     Treatment Plan Summary: Daily contact with patient to assess and evaluate symptoms and progress in treatment Medication management  Discussion/Plan: Will stop  Tegretol as she is again having staring spells on it.  She has never been on Depakote.  It may continue to do what the Tegretol has done, but may treat the staring spell aspects of her condition.  She is having random arm and leg jerks.  This could possibly be random seizure activity.  Will see what Depakote does for it.  If it does not help that, then will consult with neurology for further directions with this. Pt was pretty distressed today with the prospects of returning to the home with her husband of 8 years, where she does all the laundry, cleaning,  shopping, paying bills, cooking and child care.  Reports from her sister who lives in the same apartment building is that the apartment is a total mess.  Both her husband and her 46 yo daughter ask her every time they talk on the phone when is she coming home.  Pt was tearful when discussing this.  Her depression is worse because of this.  [name] 06/03/2011, 5:32 PM

## 2011-06-03 NOTE — Progress Notes (Signed)
Patient seen during d/c planning group.  She reports not doing well today due to stomach problems.  She currently denies SI/HI.  She rates depression, hopelessness/helplessness at five and anxiety at seven/eight.

## 2011-06-03 NOTE — Progress Notes (Signed)
[facility] Group Notes:  (Counselor/Nursing/MHT/Case Management/Adjunct) 06/03/2011   11:00am Overcoming Obstacles to Wellness   Type of Therapy:  Group Therapy  Participation Level:  Did Not Attend   [name] 06/03/2011  4:09 PM      [facility] Group Notes: (Counselor/Nursing/MHT/Case Management/Adjunct) 06/03/2011   @1 :15pm Breathing & Meditation for Anxiety/Anger   Type of Therapy:  Group Therapy  Participation Level:  Limited  Participation Quality: Attentive    Affect:  Appropriate  Cognitive:  Appropriate  Insight:  Limited  Engagement in Group: Limited  Engagement in Therapy:  Limited  Modes of Intervention:  Support and Exploration  Summary of Progress/Problems: [name] participated in circle breathing technique and lovingkindness meditation but did not process her experience with the group.  [name] 06/03/2011 4:09 PM

## 2011-06-04 MED ORDER — CHOLESTYRAMINE LIGHT 4 G PO PACK
4.0000 g | PACK | Freq: Two times a day (BID) | ORAL | Status: DC
  Administered 2011-06-05 – 2011-06-06 (×2): 4 g via ORAL
  Filled 2011-06-04 (×5): qty 1

## 2011-06-04 MED ORDER — MAGNESIUM CITRATE PO SOLN
1.0000 | Freq: Once | ORAL | Status: AC
  Administered 2011-06-04: 1 via ORAL

## 2011-06-04 NOTE — Progress Notes (Signed)
Patient ID: [name], female   DOB: 1971/01/07, 41 y.o.   MRN: [id number] Pt. Awake, alert, pleasant, NAD.  Affect is cheerful and bright.  Mood is cheerful.  Pt. Reports no BM since last Tuesday ( 1 week ago).  Had a fleets enema yesterday with no BM.  Given fleets enema this morning, no BM an hour later.  Patient had held the fluid in her rectum for as long as she could before expelling it.  Pt. Denies SI/HI/AVH.  Attending groups.

## 2011-06-04 NOTE — Progress Notes (Signed)
[facility] Group Notes:  (Counselor/Nursing/MHT/Case Management/Adjunct)  06/04/2011 1:15pm  Type of Therapy:  Group Therapy  Participation Level:  Did Not Attend   [name] 06/04/2011, 3:44 PM

## 2011-06-04 NOTE — Progress Notes (Signed)
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  Suicidal Ideation:  Pt denies any suicidal thoughts. Homicidal Ideation:  Denies adamantly any homicidal thoughts.  Mental Status Examination/Evaluation: Objective:  Appearance: Casual  Eye Contact::  Good  Speech:  Clear and Coherent  Volume:  Normal  Mood:  Anxious and Irritable ,  Affect:  Tearful  Thought Process:  Coherent  Orientation:  Full  Thought Content:  WDL  Suicidal Thoughts:  No  Homicidal Thoughts:  No  Memory:  Immediate;   Fair  Judgement:  Good  Insight:  Fair  Psychomotor Activity:  Normal  Concentration:  Good  Recall:  Fair  Akathisia:  No  Handed:  Right  AIMS (if indicated):     Assets:  Communication Skills Desire for Improvement Financial Resources/Insurance Housing  Sleep:  Number of Hours: 6.5    ROS: Neuro: has had a headache all day, but has not taken any Excerdine for it, otherwise, no ataxia, weakness  MS: no soreness, aches, or weakness  GI: she has vomited with drinking even a little water.  She has an anal fistula, but is to go to Dr Jeani Hawking and get her stomach "fixed" before she undergoes anal surgery.  Will need to get an appointment scheduled for soon after she leaves.  Vital Signs:Blood pressure 91/58, pulse 106, temperature 98.2 F (36.8 C), temperature source Oral, resp. rate 16, height 5\' 4"  (1.626 m), weight 71.215 kg (157 lb), SpO2 100.00%. Current Medications: Current Facility-Administered Medications  Medication Dose Route Frequency Provider Last Rate Last Dose  . acetaminophen (TYLENOL) tablet 650 mg  650 mg Oral Q6H PRN Curlene Labrum Readling, MD      . alum & mag hydroxide-simeth (MAALOX/MYLANTA) 200-200-20 MG/5ML suspension 30 mL  30 mL Oral Q4H  PRN Curlene Labrum Readling, MD      . aspirin-acetaminophen-caffeine (EXCEDRIN MIGRAINE) per tablet 2 tablet  2 tablet Oral Q6H PRN Mike Craze, MD   2 tablet at 06/02/11 0630  . chlorproMAZINE (THORAZINE) tablet 50 mg  50 mg Oral TID Mike Craze, MD   50 mg at 06/04/11 0942  . cholestyramine light (PREVALITE) packet 4 g  4 g Oral BID Mike Craze, MD      . dicyclomine (BENTYL) tablet 20 mg  20 mg Oral TID AC & HS Curlene Labrum Readling, MD   20 mg at 06/04/11 1205  . divalproex (DEPAKOTE ER) 24 hr tablet 750 mg  750 mg Oral QHS Mike Craze, MD   750 mg at 06/03/11 2135  . docusate sodium (COLACE) capsule 100 mg  100 mg Oral BID Alyson Kuroski-Mazzei, DO   100 mg at 06/04/11 0942  . DULoxetine (CYMBALTA) DR capsule 60 mg  60 mg Oral Daily Curlene Labrum Readling, MD   60 mg at 06/04/11 0943  . hydrochlorothiazide (MICROZIDE) capsule 12.5 mg  12.5 mg Oral Daily Curlene Labrum Readling, MD   12.5 mg at 06/04/11 0944  . lisinopril (PRINIVIL,ZESTRIL) tablet 10 mg  10 mg Oral Daily Curlene Labrum Readling, MD   10 mg at 06/04/11 0944  . magnesium citrate solution 1 Bottle  1 Bottle Oral Once Mike Craze, MD      . magnesium hydroxide (MILK OF MAGNESIA) suspension 30  mL  30 mL Oral Daily PRN Curlene Labrum Readling, MD   30 mL at 06/02/11 2131  . pantoprazole (PROTONIX) EC tablet 80 mg  80 mg Oral Q1200 Curlene Labrum Readling, MD   80 mg at 06/04/11 1205  . promethazine (PHENERGAN) tablet 12.5 mg  12.5 mg Oral Q6H PRN Curlene Labrum Readling, MD   12.5 mg at 05/31/11 0953  . propranolol (INDERAL) tablet 10 mg  10 mg Oral BID Mike Craze, MD   10 mg at 06/04/11 0941  . sodium phosphate (FLEET) 7-19 GM/118ML enema 1 enema  1 enema Rectal Daily PRN Mike Craze, MD   1 enema at 06/04/11 1022  . traMADol (ULTRAM) tablet 50 mg  50 mg Oral BID PRN Curlene Labrum Readling, MD   50 mg at 06/04/11 1218  . traZODone (DESYREL) tablet 50 mg  50 mg Oral QHS PRN Ronny Bacon, MD   50 mg at 06/03/11 2209  . DISCONTD: carbamazepine (TEGRETOL) tablet 200  mg  200 mg Oral TID Mike Craze, MD   200 mg at 06/03/11 1404  . DISCONTD: chlorproMAZINE (THORAZINE) tablet 25 mg  25 mg Oral TID Mike Craze, MD   25 mg at 06/03/11 1404  . DISCONTD: cholestyramine light (PREVALITE) packet 4 g  4 g Oral Custom Ronny Bacon, MD   4 g at 06/04/11 1208    Lab Results:  Results for orders placed during the hospital encounter of 05/28/11 (from the past 48 hour(s))  URINALYSIS, ROUTINE W REFLEX MICROSCOPIC     Status: Normal   Collection Time   06/02/11  4:40 PM      Component Value Range Comment   Color, Urine YELLOW  YELLOW     APPearance CLEAR  CLEAR     Specific Gravity, Urine 1.019  1.005 - 1.030     pH 6.5  5.0 - 8.0     Glucose, UA NEGATIVE  NEGATIVE (mg/dL)    Hgb urine dipstick NEGATIVE  NEGATIVE     Bilirubin Urine NEGATIVE  NEGATIVE     Ketones, ur NEGATIVE  NEGATIVE (mg/dL)    Protein, ur NEGATIVE  NEGATIVE (mg/dL)    Urobilinogen, UA 0.2  0.0 - 1.0 (mg/dL)    Nitrite NEGATIVE  NEGATIVE     Leukocytes, UA NEGATIVE  NEGATIVE  MICROSCOPIC NOT DONE ON URINES WITH NEGATIVE PROTEIN, BLOOD, LEUKOCYTES, NITRITE, OR GLUCOSE <1000 mg/dL.    Physical Findings: AIMS:  , ,  ,  ,    CIWA:    COWS:     Treatment Plan Summary: Daily contact with patient to assess and evaluate symptoms and progress in treatment Medication management  Discussion/Plan: Monitor effect of Depakote for headache, staring spells Constipation seems related to Tegretol, will give Mag citrate Tentative plan os to discharge on Thursday, if the Depakote helps the staring spells better.   [name] 06/04/2011, 1:45 PM

## 2011-06-04 NOTE — Progress Notes (Signed)
[facility] Group Notes:  (Counselor/Nursing/MHT/Case Management/Adjunct)  06/04/2011 11:00am  Type of Therapy:  Group Therapy  Participation Level:  Did Not Attend  [name] 06/04/2011, 3:41 PM

## 2011-06-04 NOTE — Progress Notes (Signed)
Pt has had positive results from the mag-citrate. Pt.is visible on the unit. Pleasant & bright. Denies SI,HI, & AVH. Continues on 15 minute checks.Pt. Safety maintained.

## 2011-06-05 MED ORDER — GABAPENTIN 100 MG PO CAPS
100.0000 mg | ORAL_CAPSULE | Freq: Three times a day (TID) | ORAL | Status: DC
  Administered 2011-06-05 – 2011-06-06 (×4): 100 mg via ORAL
  Filled 2011-06-05 (×6): qty 1

## 2011-06-05 NOTE — Progress Notes (Signed)
[facility] Group Notes: (Counselor/Nursing/MHT/Case Management/Adjunct) 06/05/2011   @1 :15pm Coping with Anxiety:  Using Self-Talk, Breathing Techniques, Muscle Relaxation and Other Tactics  Type of Therapy:  Group Therapy  Participation Level:  None  Participation Quality:  None   Affect:  Blunted  Cognitive:  Appropriate  Insight:  None  Engagement in Group:  None  Engagement in Therapy:  None  Modes of Intervention:  Support and Exploration  Summary of Progress/Problems: [name] participated in group activity but did not share her experience.  [name] 06/05/2011 4:05 PM

## 2011-06-05 NOTE — Progress Notes (Signed)
Pt. Stated she has a small amount of blood from her rectum. MD made aware and blood work to be ordered for this pm.

## 2011-06-05 NOTE — Progress Notes (Signed)
Pt. Continues to feel dizzy,. Spoke with MD who is stopping pt. Inderal, holding thorizine and getting blood work this pm. Pt.s vital signs manual BP- 92/p, 60 and regular, resp, 16. Pt instructed how to blow into a bag for hyperventialtion as sometimes at might she wakes up with he rhands tingling.
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
## 2011-06-05 NOTE — Progress Notes (Addendum)
[facility] MD Progress Note  06/05/2011 9:00 PM  Diagnosis:  Axis I: Post Traumatic Stress Disorder and Possible chronic traumatic encephalopathy  ADL's:  Intact  Sleep: Fair  Appetite:  Fair,  Suicidal Ideation:  Pt notes no suicidal thoughts. Homicidal Ideation:  Denies adamantly any homicidal thoughts.  Mental Status Examination/Evaluation: Objective:  Appearance: Casual  Eye Contact::  Good  Speech:  Clear and Coherent  Volume:  Normal  Mood:  Anxious ,  Affect:  Congruent  Thought Process:  Coherent  Orientation:  Full  Thought Content:  WDL  Suicidal Thoughts:  No  Homicidal Thoughts:  No  Memory:  Immediate;   Fair  Judgement:  Good  Insight:  Good  Psychomotor Activity:  Normal  Concentration:  Good  Recall:  Fair  Akathisia:  No  Handed:  Right  AIMS (if indicated):     Assets:  Communication Skills Desire for Improvement Financial Resources/Insurance Housing  Sleep:  Number of Hours: 6.75    ROS: Neuro: no ataxia or weakness.  Pt had requested a neurological referral from her family practice doctor, but they refused.  It seems the primary problem she has had here has been a blend of psychiatric and neurological.  She needs to have a neurologist to fine tune the Depakote and Neurontin for optimal management of her staring spells and partial onset seizures (associated anxiety, dizziness, and depression and mood dysregulation.)  Initial contact made with with [facility] at [phone].  They are to fax a referral form to the Adult Inpatient fax number.  MS: denies soreness, aches, or weakness  GI: had bright red blood per rectum last night and today.  She has had stomach discomfort too today.  Will try and get her an appointment soon with Dr Griffin Basil.  Vital Signs:Blood pressure 92/60, pulse 60, temperature 96.6 F (35.9 C), temperature source Oral, resp. rate 16, height 5\' 4"  (1.626 m), weight 71.215 kg (157 lb), SpO2 100.00%. Current  Medications: Current Facility-Administered Medications  Medication Dose Route Frequency Provider Last Rate Last Dose  . acetaminophen (TYLENOL) tablet 650 mg  650 mg Oral Q6H PRN Curlene Labrum Readling, MD      . alum & mag hydroxide-simeth (MAALOX/MYLANTA) 200-200-20 MG/5ML suspension 30 mL  30 mL Oral Q4H PRN Curlene Labrum Readling, MD      . aspirin-acetaminophen-caffeine (EXCEDRIN MIGRAINE) per tablet 2 tablet  2 tablet Oral Q6H PRN Mike Craze, MD   2 tablet at 06/02/11 0630  . chlorproMAZINE (THORAZINE) tablet 50 mg  50 mg Oral TID Mike Craze, MD   50 mg at 06/05/11 4540  . cholestyramine light (PREVALITE) packet 4 g  4 g Oral BID Mike Craze, MD   4 g at 06/05/11 0929  . dicyclomine (BENTYL) tablet 20 mg  20 mg Oral TID AC & HS Curlene Labrum Readling, MD   20 mg at 06/05/11 1723  . divalproex (DEPAKOTE ER) 24 hr tablet 750 mg  750 mg Oral QHS Mike Craze, MD   750 mg at 06/04/11 2143  . docusate sodium (COLACE) capsule 100 mg  100 mg Oral BID Alyson Kuroski-Mazzei, DO   100 mg at 06/05/11 0927  . DULoxetine (CYMBALTA) DR capsule 60 mg  60 mg Oral Daily Curlene Labrum Readling, MD   60 mg at 06/05/11 0928  . gabapentin (NEURONTIN) capsule 100 mg  100 mg Oral TID Mike Craze, MD   100 mg at 06/05/11 2052  . hydrochlorothiazide (MICROZIDE) capsule 12.5  mg  12.5 mg Oral Daily Curlene Labrum Readling, MD   12.5 mg at 06/05/11 0929  . lisinopril (PRINIVIL,ZESTRIL) tablet 10 mg  10 mg Oral Daily Curlene Labrum Readling, MD   10 mg at 06/05/11 0927  . magnesium hydroxide (MILK OF MAGNESIA) suspension 30 mL  30 mL Oral Daily PRN Curlene Labrum Readling, MD   30 mL at 06/02/11 2131  . pantoprazole (PROTONIX) EC tablet 80 mg  80 mg Oral Q1200 Curlene Labrum Readling, MD   80 mg at 06/05/11 1159  . promethazine (PHENERGAN) tablet 12.5 mg  12.5 mg Oral Q6H PRN Curlene Labrum Readling, MD   12.5 mg at 05/31/11 0953  . traMADol (ULTRAM) tablet 50 mg  50 mg Oral BID PRN Curlene Labrum Readling, MD   50 mg at 06/04/11 1218  . traZODone (DESYREL) tablet 50  mg  50 mg Oral QHS PRN Ronny Bacon, MD   50 mg at 06/04/11 2145  . DISCONTD: propranolol (INDERAL) tablet 10 mg  10 mg Oral BID Mike Craze, MD   10 mg at 06/05/11 0929  . DISCONTD: sodium phosphate (FLEET) 7-19 GM/118ML enema 1 enema  1 enema Rectal Daily PRN Mike Craze, MD   1 enema at 06/04/11 1022    Lab Results:  No results found for this or any previous visit (from the past 48 hour(s)).  Physical Findings: AIMS:  , ,  ,  ,    CIWA:    COWS:     Treatment Plan Summary: Daily contact with patient to assess and evaluate symptoms and progress in treatment Medication management  Discussion/Plan: Mag Citrate did not completely relieve her constipation. Depakote has not stopped the staring spells.  Will add Neurontin. It is of note that Tegretol initially helped the dizziness racing thoughts and anxiety, but caused constipation and did not completely relieve the staring spells.  Will refer to neurologist.  BP seems to be varying widely will stop the Inderal.  She requests a referral to a GYN also.  Anmarie Fukushima 06/05/2011, 9:00 PM

## 2011-06-05 NOTE — Progress Notes (Signed)
Pt was in bed during group this morning. When she came to medication window to receive morning medications, she c/o dizziness. Instructed pt to sit in chair, took vitals and gave Gatorade. When pt was feeling better, assisted to group room and advised pt to alert staff before getting out of chair. Sent note for MD. Pt reports no dizziness at this time. Safety maintained.

## 2011-06-05 NOTE — Progress Notes (Signed)
D:  Pt attended AA meeting this evening.  A:  Encouraged group attendance that will support her treatment goals. R:  Pt is safe. Aundria Rud, Idris Edmundson L, MHT/NS

## 2011-06-05 NOTE — Progress Notes (Signed)
[facility] Group Notes:  (Counselor/Nursing/MHT/Case Management/Adjunct)   06/05/2011  11:00am Emotion Regulation   Type of Therapy:  Group Therapy  Participation Level:  Did Not Attend       [name] 06/05/2011  12:18 PM

## 2011-06-05 NOTE — Progress Notes (Signed)
Patient ID: [name], female   DOB: 06-02-1970, 41 y.o.   MRN: [id number] Pt. Reports depression "2" of 10 and pain level at "8" of 10. Pt. Went to NA/AA group tonight and found it helpful in understanding son's addiction. Pt. Reports happy to be going home tomorrow. Pt. Denies SHI. Staff will monitor q77min for safety.

## 2011-06-06 LAB — CBC
HCT: 39.5 % (ref 36.0–46.0)
MCHC: 32.7 g/dL (ref 30.0–36.0)
Platelets: 260 10*3/uL (ref 150–400)
RDW: 13.5 % (ref 11.5–15.5)

## 2011-06-06 MED ORDER — TRAZODONE HCL 50 MG PO TABS: 50.0000 mg | ORAL_TABLET | Freq: Every evening | ORAL | Status: DC | PRN

## 2011-06-06 MED ORDER — DULOXETINE HCL 60 MG PO CPEP: 60.0000 mg | ORAL_CAPSULE | Freq: Every day | ORAL | Status: DC

## 2011-06-06 MED ORDER — CHOLESTYRAMINE LIGHT 4 G PO PACK: 4.0000 g | PACK | Freq: Two times a day (BID) | ORAL | Status: DC

## 2011-06-06 MED ORDER — LISINOPRIL-HYDROCHLOROTHIAZIDE 10-12.5 MG PO TABS: 1.0000 | ORAL_TABLET | Freq: Every day | ORAL | Status: DC

## 2011-06-06 MED ORDER — DSS 100 MG PO CAPS: 100.0000 mg | ORAL_CAPSULE | Freq: Two times a day (BID) | ORAL | Status: AC

## 2011-06-06 MED ORDER — CHLORPROMAZINE HCL 50 MG PO TABS: 50.0000 mg | ORAL_TABLET | Freq: Three times a day (TID) | ORAL | Status: DC

## 2011-06-06 MED ORDER — OMEPRAZOLE 40 MG PO CPDR: 40.0000 mg | DELAYED_RELEASE_CAPSULE | Freq: Two times a day (BID) | ORAL | Status: DC

## 2011-06-06 MED ORDER — DICYCLOMINE HCL 20 MG PO TABS: 20.0000 mg | ORAL_TABLET | Freq: Four times a day (QID) | ORAL | Status: DC

## 2011-06-06 MED ORDER — GABAPENTIN 100 MG PO CAPS: 100.0000 mg | ORAL_CAPSULE | Freq: Three times a day (TID) | ORAL | Status: DC

## 2011-06-06 MED ORDER — DIVALPROEX SODIUM ER 250 MG PO TB24: 750.0000 mg | ORAL_TABLET | Freq: Every day | ORAL | Status: DC

## 2011-06-06 NOTE — Progress Notes (Signed)
[facility] Case Management Discharge Plan:  Will you be returning to the same living situation after discharge: Yes,  [name] will return home with her family At discharge, do you have transportation home?:Yes,  Patient advised she has transportation Do you have the ability to pay for your medications:Yes,  [name] has Medicaid  Interagency Information:     Release of information consent forms completed and in the chart;  Patient's signature needed at discharge.  Patient to Follow up at:  Follow-up Information    Follow up with [facility]. (Please go to [facility] on Friday, Jun 07, 2011 between 8:00 a.m. and 3:00 p.m.)       Follow up with  [facility] on 06/13/2011. (You are scheduled to seen by Tamera Punt at  8:00 a.m. on Thursday, Jun 13, 2011)          Patient denies SI/HI:   Yes,  Patient no longer endorsing SI    Safety Planning and Suicide Prevention discussed:  Yes,  Reviewed during dischare planning groups  Barrier to discharge identified:No.  Summary and Recommendations:  Patient encouraged to follow up with outpatient recommendations   Wynn Banker 06/06/2011, 9:32 AM

## 2011-06-06 NOTE — Progress Notes (Signed)
[facility] Family/Significant Other Suicide Prevention Education  Suicide Prevention Education:  Education Completed; [name], husband, [phone]) has been identified by the patient as the family member/significant other with whom the patient will be residing, and identified as the person(s) who will aid the patient in the event of a mental health crisis (suicidal ideations/suicide attempt).  With written consent from the patient, the family member/significant other has been provided the following suicide prevention education, prior to the and/or following the discharge of the patient.  The suicide prevention education provided includes the following:  Suicide risk factors  Suicide prevention and interventions  National Suicide Hotline telephone number  [facility] assessment telephone number  [facility] Emergency Assistance 911  [facility] and/or Residential Mobile Crisis Unit telephone number  Request made of family/significant other to:  Remove weapons (e.g., guns, rifles, knives), all items previously/currently identified as safety concern.    Remove drugs/medications (over-the-counter, prescriptions, illicit drugs), all items previously/currently identified as a safety concern.  [name] reported that he has no safety concerns and is in agreement with [name] being discharged today. He stated that there are no firearms in the home. [name] verbalized understanding of suicide prevention information and had no further questions. He requested that counselor send a Spanish language version of the suicide prevention pamphlet home with [name].    [name] 06/06/2011, 1:06 PM

## 2011-06-06 NOTE — Progress Notes (Signed)
[facility] MD Progress Note  06/06/2011 1:53 PM  Diagnosis:  Axis I: Post Traumatic Stress Disorder and Temproal lobe difficulties and seizures  ADL's:  Intact  Sleep: Fair  Appetite:  Fair,  Suicidal Ideation:  Pt notes no suicidal thoughts. Homicidal Ideation:  Denies adamantly any homicidal thoughts.  Mental Status Examination/Evaluation: Objective:  Appearance: Casual  Eye Contact::  Good  Speech:  Clear and Coherent  Volume:  Normal  Mood:  Anxious ,  Affect:  Congruent  Thought Process:  Coherent  Orientation:  Full  Thought Content:  WDL  Suicidal Thoughts:  No  Homicidal Thoughts:  No  Memory:  Immediate;   Fair  Judgement:  Good  Insight:  Good  Psychomotor Activity:  Normal  Concentration:  Good  Recall:  Fair  Akathisia:  No  Handed:  Right  AIMS (if indicated):     Assets:  Communication Skills Desire for Improvement Financial Resources/Insurance Housing  Sleep:  Number of Hours: 6.75    ROS: Neuro: no ataxia or weakness.  Referral made to [facility]  MS: denies soreness, aches, or weakness  GI: had bright red blood per rectum last night and today.  She has had stomach discomfort too today.  Will try and get her an appointment soon with Dr Griffin Basil at [phone].  Vital Signs:Blood pressure 86/64, pulse 97, temperature 97 F (36.1 C), temperature source Oral, resp. rate 15, height 5\' 4"  (1.626 m), weight 71.215 kg (157 lb), SpO2 100.00%. Current Medications: Current Facility-Administered Medications  Medication Dose Route Frequency Provider Last Rate Last Dose  . acetaminophen (TYLENOL) tablet 650 mg  650 mg Oral Q6H PRN Curlene Labrum Readling, MD      . alum & mag hydroxide-simeth (MAALOX/MYLANTA) 200-200-20 MG/5ML suspension 30 mL  30 mL Oral Q4H PRN Curlene Labrum Readling, MD      . aspirin-acetaminophen-caffeine (EXCEDRIN MIGRAINE) per tablet 2 tablet  2 tablet Oral Q6H PRN Mike Craze, MD   2 tablet at 06/02/11 0630  . chlorproMAZINE  (THORAZINE) tablet 50 mg  50 mg Oral TID Mike Craze, MD   50 mg at 06/06/11 0809  . cholestyramine light (PREVALITE) packet 4 g  4 g Oral BID Mike Craze, MD   4 g at 06/06/11 0809  . dicyclomine (BENTYL) tablet 20 mg  20 mg Oral TID AC & HS Curlene Labrum Readling, MD   20 mg at 06/06/11 1154  . divalproex (DEPAKOTE ER) 24 hr tablet 750 mg  750 mg Oral QHS Mike Craze, MD   750 mg at 06/05/11 2144  . docusate sodium (COLACE) capsule 100 mg  100 mg Oral BID Alyson Kuroski-Mazzei, DO   100 mg at 06/06/11 0808  . DULoxetine (CYMBALTA) DR capsule 60 mg  60 mg Oral Daily Curlene Labrum Readling, MD   60 mg at 06/06/11 0808  . gabapentin (NEURONTIN) capsule 100 mg  100 mg Oral TID Mike Craze, MD   100 mg at 06/06/11 4540  . hydrochlorothiazide (MICROZIDE) capsule 12.5 mg  12.5 mg Oral Daily Curlene Labrum Readling, MD   12.5 mg at 06/05/11 0929  . lisinopril (PRINIVIL,ZESTRIL) tablet 10 mg  10 mg Oral Daily Curlene Labrum Readling, MD   10 mg at 06/05/11 0927  . magnesium hydroxide (MILK OF MAGNESIA) suspension 30 mL  30 mL Oral Daily PRN Curlene Labrum Readling, MD   30 mL at 06/02/11 2131  . pantoprazole (PROTONIX) EC tablet 80 mg  80 mg Oral Q1200  Curlene Labrum Readling, MD   80 mg at 06/06/11 1153  . promethazine (PHENERGAN) tablet 12.5 mg  12.5 mg Oral Q6H PRN Curlene Labrum Readling, MD   12.5 mg at 05/31/11 0953  . traMADol (ULTRAM) tablet 50 mg  50 mg Oral BID PRN Ronny Bacon, MD   50 mg at 06/05/11 2149  . traZODone (DESYREL) tablet 50 mg  50 mg Oral QHS PRN Ronny Bacon, MD   50 mg at 06/05/11 2144  . DISCONTD: propranolol (INDERAL) tablet 10 mg  10 mg Oral BID Mike Craze, MD   10 mg at 06/05/11 0929  . DISCONTD: sodium phosphate (FLEET) 7-19 GM/118ML enema 1 enema  1 enema Rectal Daily PRN Mike Craze, MD   1 enema at 06/04/11 1022    Lab Results:  Results for orders placed during the hospital encounter of 05/28/11 (from the past 48 hour(s))  CBC     Status: Normal   Collection Time   06/06/11  6:20 AM       Component Value Range Comment   WBC 6.3  4.0 - 10.5 (K/uL)    RBC 4.03  3.87 - 5.11 (MIL/uL)    Hemoglobin 12.9  12.0 - 15.0 (g/dL)    HCT 16.1  09.6 - 04.5 (%)    MCV 98.0  78.0 - 100.0 (fL)    MCH 32.0  26.0 - 34.0 (pg)    MCHC 32.7  30.0 - 36.0 (g/dL)    RDW 40.9  81.1 - 91.4 (%)    Platelets 260  150 - 400 (K/uL)   VALPROIC ACID LEVEL     Status: Normal   Collection Time   06/06/11  6:20 AM      Component Value Range Comment   Valproic Acid Lvl 50.7  50.0 - 100.0 (ug/mL)     Physical Findings: AIMS:  , ,  ,  ,    CIWA:    COWS:     Treatment Plan Summary: Daily contact with patient to assess and evaluate symptoms and progress in treatment Medication management  Discussion/Plan: D/C today. F/U with [facility] and Dr Jeani Hawking (GI)  [name], [name] 06/06/2011, 1:53 PM

## 2011-06-06 NOTE — Progress Notes (Signed)
Patient discharged to home.  Reviewed all discharge instructions, medications, and follow up care.  Patient verbalized understanding of all.  Two day supply of medications given to patient from hospital pharmacy.  Patient denies suicidal ideation at this time.  All belongings collected from room and retrieved from locker number 4.  Patient left the unit ambulatory with MHT and was escorted to the front lobby where her sister was waiting to give her a ride home.
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
## 2011-06-06 NOTE — Tx Team (Signed)
Interdisciplinary Treatment Plan Update (Adult)  Date:  06/06/2011  Time Reviewed:  10:20 AM   Progress in Treatment: Attending groups:   Yes   Participating in groups:  Yes Taking medication as prescribed:  Yes Tolerating medication:  Yes Family/Significant othe contact made: Contact made with family Patient understands diagnosis:  Yes Discussing patient identified problems/goals with staff: Yes Medical problems stabilized or resolved: Yes Denies suicidal/homicidal ideation:Yes Issues/concerns per patient self-inventory:  Other:  New problem(s) identified:  Reason for Continuation of Hospitalization:  Interventions implemented related to continuation of hospitalization:  Additional comments:  Estimated length of stay:  Discharge home today  Discharge Plan:  Home with outpatient follow up  New goal(s):  Review of initial/current patient goals per problem list:    1.  Goal(s):  Eliminate SI/other thoughts of self harm  Met:  Yes  Target date: d/c  As evidenced by:  Patient is no longer endorsing SI  2.  Goal (s):  Reduce depression (rated at ten on admission)  Met:  Yes  Target date:  d/c  As evidenced by: Patient will rate depression at four or below (Patient is rating depression at one today)  3.  Goal(s):  Reduce anxiety  Met:  Yes  Target date:  d/c  As evidenced by: Patient will rate anxiety at four or below (Deferred - patient is rating anxiety at seven today but states it is due to excitement about discharging.  Patient rates her baseline depression at five.  4.  Goal(s):  Stabilize on medications  Met:  Yes  Target date: d/c  As evidenced by: Patient reports being stabilized on medications  Attendees: Patient:  [name] 06/06/2011  10:20 AM  Family:     Physician:  Orson Aloe, MD 06/06/2011 10:20 AM   Nursing:   Cammy Brochure, RN 06/06/2011 10:20 AM   CaseManager:  Juline Patch, LCSW 06/06/2011 10:20 AM   Counselor:  Angus Palms,  LCSW 06/06/2011 10:20 AM   Other: Baird Cancer, RN 06/06/2011 10:20 AM   Other:  Reyes Ivan, LCSWA 06/06/2011  10:20 AM   Other:  Barrie Folk, RN 06/06/2011 10:21 AM  Other:      Scribe for Treatment Team:   Wynn Banker, LCSW,  06/06/2011 10:20 AM

## 2011-06-07 NOTE — Progress Notes (Signed)
Patient Discharge Instructions:  After Visit Summary (AVS):   Faxed to:  06/07/2011 Face Sheet:   Faxed to:  06/07/2011 Psychiatric Admission Assessment Note:   Faxed to:  06/07/2011 Suicide Risk Assessment - Discharge Assessment:   Faxed to:  06/07/2011 Faxed/Sent to the Next Level Care provider:  06/07/2011  Faxed to [facility] @ [phone] And to [facility] @ [phone]  Wandra Scot, 06/07/2011, 6:11 PM

## 2011-06-11 NOTE — Discharge Summary (Signed)
I agree with this D/C Summary.  

## 2011-07-21 ENCOUNTER — Emergency Department (HOSPITAL_COMMUNITY): Payer: Medicaid Other

## 2011-07-21 ENCOUNTER — Encounter (HOSPITAL_COMMUNITY): Payer: Self-pay | Admitting: *Deleted

## 2011-07-21 ENCOUNTER — Emergency Department (HOSPITAL_COMMUNITY)
Admission: EM | Admit: 2011-07-21 | Discharge: 2011-07-21 | Disposition: A | Discharge status: Home / Self Care | Payer: Medicaid Other | Attending: Emergency Medicine | Admitting: Emergency Medicine

## 2011-07-21 DIAGNOSIS — Z9089 Acquired absence of other organs: Secondary | ICD-10-CM | POA: Insufficient documentation

## 2011-07-21 DIAGNOSIS — K5900 Constipation, unspecified: Secondary | ICD-10-CM | POA: Insufficient documentation

## 2011-07-21 DIAGNOSIS — R10819 Abdominal tenderness, unspecified site: Secondary | ICD-10-CM | POA: Insufficient documentation

## 2011-07-21 DIAGNOSIS — R110 Nausea: Secondary | ICD-10-CM | POA: Insufficient documentation

## 2011-07-21 DIAGNOSIS — R109 Unspecified abdominal pain: Secondary | ICD-10-CM | POA: Insufficient documentation

## 2011-07-21 LAB — DIFFERENTIAL
Basophils Absolute: 0 10*3/uL (ref 0.0–0.1)
Basophils Relative: 1 % (ref 0–1)
Eosinophils Absolute: 0.1 10*3/uL (ref 0.0–0.7)
Eosinophils Relative: 2 % (ref 0–5)
Lymphocytes Relative: 30 % (ref 12–46)
Lymphs Abs: 1.8 10*3/uL (ref 0.7–4.0)
Monocytes Absolute: 0.6 10*3/uL (ref 0.1–1.0)
Monocytes Relative: 10 % (ref 3–12)
Neutro Abs: 3.5 10*3/uL (ref 1.7–7.7)
Neutrophils Relative %: 58 % (ref 43–77)

## 2011-07-21 LAB — URINALYSIS, ROUTINE W REFLEX MICROSCOPIC
Bilirubin Urine: NEGATIVE
Glucose, UA: NEGATIVE mg/dL
Hgb urine dipstick: NEGATIVE
Leukocytes, UA: NEGATIVE
Nitrite: NEGATIVE
Protein, ur: NEGATIVE mg/dL
Specific Gravity, Urine: 1.032 — ABNORMAL HIGH (ref 1.005–1.030)
Urobilinogen, UA: 0.2 mg/dL (ref 0.0–1.0)
pH: 5.5 (ref 5.0–8.0)

## 2011-07-21 LAB — CBC
HCT: 41.9 % (ref 36.0–46.0)
Hemoglobin: 13.6 g/dL (ref 12.0–15.0)
MCH: 32.4 pg (ref 26.0–34.0)
MCHC: 32.5 g/dL (ref 30.0–36.0)
MCV: 99.8 fL (ref 78.0–100.0)
Platelets: 276 10*3/uL (ref 150–400)
RBC: 4.2 MIL/uL (ref 3.87–5.11)
RDW: 13.9 % (ref 11.5–15.5)
WBC: 6 10*3/uL (ref 4.0–10.5)

## 2011-07-21 LAB — COMPREHENSIVE METABOLIC PANEL
ALT: 505 U/L — ABNORMAL HIGH (ref 0–35)
AST: 203 U/L — ABNORMAL HIGH (ref 0–37)
Albumin: 3.9 g/dL (ref 3.5–5.2)
Alkaline Phosphatase: 123 U/L — ABNORMAL HIGH (ref 39–117)
BUN: 16 mg/dL (ref 6–23)
CO2: 24 mEq/L (ref 19–32)
Calcium: 9.1 mg/dL (ref 8.4–10.5)
Chloride: 102 mEq/L (ref 96–112)
Creatinine, Ser: 0.63 mg/dL (ref 0.50–1.10)
GFR calc Af Amer: >90 mL/min (ref >90)
GFR calc non Af Amer: >90 mL/min (ref >90)
Glucose, Bld: 78 mg/dL (ref 70–99)
Potassium: 4 mEq/L (ref 3.5–5.1)
Sodium: 135 mEq/L (ref 135–145)
Total Bilirubin: 0.3 mg/dL (ref 0.3–1.2)
Total Protein: 7.7 g/dL (ref 6.0–8.3)

## 2011-07-21 LAB — PREGNANCY, URINE: Preg Test, Ur: NEGATIVE

## 2011-07-21 LAB — LIPASE, BLOOD: Lipase: 31 U/L (ref 11–59)

## 2011-07-21 MED ORDER — MORPHINE SULFATE 4 MG/ML IJ SOLN
6.0000 mg | Freq: Once | INTRAMUSCULAR | Status: AC
  Administered 2011-07-21: 6 mg via INTRAVENOUS
  Filled 2011-07-21: qty 2

## 2011-07-21 MED ORDER — SODIUM CHLORIDE 0.9 % IV BOLUS (SEPSIS)
1000.0000 mL | Freq: Once | INTRAVENOUS | Status: AC
  Administered 2011-07-21: 1000 mL via INTRAVENOUS

## 2011-07-21 MED ORDER — ONDANSETRON HCL 4 MG/2ML IJ SOLN
4.0000 mg | Freq: Once | INTRAMUSCULAR | Status: AC
  Administered 2011-07-21: 4 mg via INTRAVENOUS
  Filled 2011-07-21: qty 2

## 2011-07-21 MED ORDER — HYDROMORPHONE HCL PF 1 MG/ML IJ SOLN
1.0000 mg | Freq: Once | INTRAMUSCULAR | Status: AC
  Administered 2011-07-21: 1 mg via INTRAVENOUS
  Filled 2011-07-21: qty 1

## 2011-07-21 MED ORDER — POLYETHYLENE GLYCOL 3350 17 G PO PACK: 17.0000 g | PACK | Freq: Every day | ORAL | Status: AC

## 2011-07-21 NOTE — Discharge Instructions (Signed)
Use stool softeners. Follow up with Dr. Elnoria Howard. Return here as needed.

## 2011-07-21 NOTE — ED Notes (Signed)
Pt states that she has a known hernia to LLQ and has appt with surgeon on July 30, but she is experiencing increasing pain. Normally, pain is constant at 7/10, but last night pain went to 10/10 while walking and has not let up. She is having chills, but no fever. Last BM was two days ago and was WNL. She is also experiencing tenderness in rectum and vagina. She further reports decreased urine output.

## 2011-07-21 NOTE — ED Provider Notes (Signed)
History     CSN: [id number]  Arrival date & time 07/21/11  1428   First MD Initiated Contact with Patient 07/21/11 1540      Chief Complaint  Patient presents with  . Abdominal Pain    (Consider location/radiation/quality/duration/timing/severity/associated sxs/prior treatment) HPI Patient presents emergency department with left-sided abdominal pain.  States it has been ongoing for several months and she is due to see a Careers adviser at the end of July.  Patient, states, that she takes pain medications and no stool softeners.  Patient denies chest pain, shortness of breath, diarrhea, fever, cough, dysuria or back pain.  Patient, states the pain is always located in the same area of her left abdomen.  Patient has no right upper quadrant pain.  Patient has chronic abdominal pain in the same region that she has pain at this time.  She states she had constipation in the past that caused her pain, similar to this. Past Medical History  Diagnosis Date  . Dysrhythmia     not followed by cardiogist  . Anemia   . Blood transfusion   . Seizures   . Headache   . Depression   . Hypertension     Dr. Knox Royalty [phone]  . Abdominal pain     takes omeprazole    Past Surgical History  Procedure Date  . Abdominal hysterectomy   . Cesarean section     x2  . Abdominoplasty   . Cervical spine surgery     2002  . Shoulder surgery     left humeral head replacement 2001  . Breast surgery     breast implants x 2  . Cholecystectomy 02/28/2011    Procedure: LAPAROSCOPIC CHOLECYSTECTOMY;  Surgeon: Clovis Pu. Cornett, MD;  Location: [facility] OR;  Service: General;  Laterality: N/A;  Laparoscopic cholecystectomy.    Family History  Problem Relation Age of Onset  . Cancer Mother     colon, skin  . Anesthesia problems Mother   . Heart disease Father     History  Substance Use Topics  . Smoking status: Never Smoker   . Smokeless tobacco: Never Used  . Alcohol Use: No    OB History    Grav  Para Term Preterm Abortions TAB SAB Ect Mult Living                  Review of Systems All other systems negative except as documented in the HPI. All pertinent positives and negatives as reviewed in the HPI.  Allergies  Latex and Naproxen  Home Medications     BP 123/60  Pulse 84  Temp 98.7 F (37.1 C) (Oral)  Resp 18  Ht 5\' 7"  (1.702 m)  Wt 160 lb (72.576 kg)  BMI 25.06 kg/m2  SpO2 99%  Physical Exam  Nursing note and vitals reviewed. Constitutional: She is oriented to person, place, and time. She appears well-developed and well-nourished.  HENT:  Head: Normocephalic and atraumatic.  Mouth/Throat: Oropharynx is clear and moist.  Eyes: Pupils are equal, round, and reactive to light.  Neck: Normal range of motion. Neck supple.  Cardiovascular: Normal rate and regular rhythm.   Pulmonary/Chest: Effort normal and breath sounds normal.  Abdominal: Soft. She exhibits no distension. There is tenderness. There is no rebound and no guarding.  Neurological: She is alert and oriented to person, place, and time.  Skin: Skin is warm and dry. No rash noted.    ED Course  Procedures (including critical care time)  Labs  Reviewed  COMPREHENSIVE METABOLIC PANEL - Abnormal; Notable for the following:    AST 203 (*)     ALT 505 (*)     Alkaline Phosphatase 123 (*)     All other components within normal limits  URINALYSIS, ROUTINE W REFLEX MICROSCOPIC - Abnormal; Notable for the following:    APPearance CLOUDY (*)     Specific Gravity, Urine 1.032 (*)     Ketones, ur TRACE (*)     All other components within normal limits  CBC  DIFFERENTIAL  PREGNANCY, URINE  LIPASE, BLOOD   Dg Abd Acute W/chest  07/21/2011  *RADIOLOGY REPORT*  Clinical Data: Abdominal pain and nausea.  ACUTE ABDOMEN SERIES (ABDOMEN 2 VIEW & CHEST 1 VIEW)  Comparison: CT scan and 06/02/2011.  Findings: The upright chest x-ray demonstrates no acute cardiopulmonary findings.  No pleural effusion.  Two views of  the abdomen demonstrate a moderate amount of stool throughout the colon suggesting constipation.  No findings for small bowel obstruction and/or perforation.  The soft tissue shadows are maintained.  The bony structures are intact.  IMPRESSION:  1.  No acute cardiopulmonary findings. 2.  Moderate stool throughout the colon suggesting constipation. No findings for small bowel obstruction or free air.  Original Report Authenticated By: P. Loralie Champagne, M.D.   Patient denies vaginal pain or discharge. The patient will be referred back to her GI doctor for further eval. She is stable here in the ER. The patient has no signs of incarcerated hernia. The patient is advised to return to the ER for any worsening in her condition. Patient has elevated LFTs but no RUQ pain or fever. No signs of Cholecystitis. Patient re-examined and no further pain at time of discharge.      MDM         Carlyle Dolly, PA-C 07/23/11 0139

## 2011-07-23 NOTE — ED Provider Notes (Signed)
Medical screening examination/treatment/procedure(s) were performed by non-physician practitioner and as supervising physician I was immediately available for consultation/collaboration.  Flint Melter, MD 07/23/11 [phone]

## 2011-08-02 ENCOUNTER — Ambulatory Visit (INDEPENDENT_AMBULATORY_CARE_PROVIDER_SITE_OTHER): Payer: Medicaid Other | Admitting: Surgery

## 2011-08-02 ENCOUNTER — Encounter (INDEPENDENT_AMBULATORY_CARE_PROVIDER_SITE_OTHER): Payer: Self-pay | Admitting: Surgery

## 2011-08-02 VITALS — BP 126/84 | HR 84 | Temp 97.2°F | Resp 16 | Ht 68.0 in | Wt 162.8 lb

## 2011-08-02 DIAGNOSIS — K439 Ventral hernia without obstruction or gangrene: Secondary | ICD-10-CM

## 2011-08-02 NOTE — Progress Notes (Signed)
Patient ID: [name], female   DOB: 01/20/1971, 40 y.o.   MRN: [id number]  No chief complaint on file.   HPI [name] is a 40 y.o. female.   HPIThe patient returns in followup due to a bulge located in the lower abdominal wall left of midline within previous abdominoplasty scar. She has pain which is getting worse and a bulge made worse with coughing or straining. She has a history of chronic constipation. She had a cholecystectomy earlier this year for dyskinesia.  Past Medical History  Diagnosis Date  . Dysrhythmia     not followed by cardiogist  . Anemia   . Blood transfusion   . Seizures   . Headache   . Depression   . Hypertension     Dr. Enrico Jones [phone]  . Abdominal pain     takes omeprazole    Past Surgical History  Procedure Date  . Abdominal hysterectomy   . Cesarean section     x2  . Abdominoplasty   . Cervical spine surgery     2002  . Shoulder surgery     left humeral head replacement 2001  . Breast surgery     breast implants x 2  . Cholecystectomy 02/28/2011    Procedure: LAPAROSCOPIC CHOLECYSTECTOMY;  Surgeon: Yovana Scogin A. Janah Mcculloh, MD;  Location: [facility] OR;  Service: General;  Laterality: N/A;  Laparoscopic cholecystectomy.    Family History  Problem Relation Age of Onset  . Cancer Mother     colon, skin  . Anesthesia problems Mother   . Heart disease Father     Social History History  Substance Use Topics  . Smoking status: Never Smoker   . Smokeless tobacco: Never Used  . Alcohol Use: No    Allergies  Allergen Reactions  . Latex Anaphylaxis  . Naproxen Anaphylaxis      Review of Systems Review of Systems  Constitutional: Negative.   HENT: Negative.   Eyes: Negative.   Respiratory: Negative.   Cardiovascular: Negative.   Gastrointestinal: Positive for abdominal pain and constipation.  Genitourinary: Negative.   Musculoskeletal: Negative.   Skin: Negative.   Neurological: Negative.     Hematological: Negative.   Psychiatric/Behavioral: Negative.     Blood pressure 126/84, pulse 84, temperature 97.2 F (36.2 C), resp. rate 16, height 5' 8" (1.727 m), weight 162 lb 12.8 oz (73.846 kg).  Physical Exam Physical Exam  Constitutional: She is oriented to person, place, and time. She appears well-developed and well-nourished.  HENT:  Head: Normocephalic and atraumatic.  Eyes: EOM are normal. Pupils are equal, round, and reactive to light.  Neck: Normal range of motion. Neck supple.  Cardiovascular: Normal rate and regular rhythm.   Pulmonary/Chest: Effort normal and breath sounds normal.  Abdominal:    Musculoskeletal: Normal range of motion.  Neurological: She is alert and oriented to person, place, and time.  Skin: Skin is warm and dry.  Psychiatric: She has a normal mood and affect. Her behavior is normal. Judgment and thought content normal.      Assessment    VENTRAL HERNIA LEFT LOWER QUADRATNT Plan    Laparoscopy with potential laparoscopic repair of ventral hernia with mesh located left lower quadrant along abdominoplasty incision.The risk of hernia repair include bleeding,  Infection,   Recurrence of the hernia,  Mesh use, chronic pain,  Organ injury,  Bowel injury,  Bladder injury,   nerve injury with numbness around the incision,  Death,  and worsening of preexisting  medical problems.    The alternatives to surgery have been discussed as well..  Long term expectations of both operative and non operative treatments have been discussed.   The patient agrees to proceed.       Yarel Kilcrease A. 08/02/2011, 10:37 AM    

## 2011-08-02 NOTE — Patient Instructions (Signed)
Hernia, reparacin con laparoscopa (Hernia Repair with Laparoscope) Una hernia ocurre cuando un rgano interno protruye a travs de un punto dbil de los msculos de la pared muscular abdominal (del vientre). Se producen con mayor frecuencia en la ingle y alrededor del ombligo. Tambin pueden producirse a travs de una incisin (corte realizado por el cirujano) despus de una operacin abdominal. Las causas de hernia son:  Levantar objetos pesados.   Tos prolongada.   Hacer fuerza para mover el intestino.  Generalmente puede volver a Museum/gallery exhibitions officer (reducirse). La mayor parte de las hernias tienden a Theme park manager con Museum/gallery conservator. Se producen algunos problemas cuando alguna parte del contenido abdominal se atasca en la abertura y el flujo de sangre se bloquea o se ve dificultado (hernia encarcelada). Debido a Clinical biochemist, es necesario someterse a una ciruga para reparar la hernia. La hernia se reparar utilizando un laparoscopio La ciruga laparoscpica es un tipo de ciruga mnimamente invasora. No implica ningn corte quirrgico (incisin) en la piel. El laparoscopio es un dispositivo similar a un telescopio que utiliza varillas y lentes. Se conecta a una cmara de vdeo y a una fuente de luz, con la que el profesional puede ver claramente la zona de la operacin. El instrumento se inserta por una abertura en la piel de  to  inch (5 mm or 10 mm), en ciertos puntos especficos. Se crea un espacio de visualizacin y manipulacin, insuflando una pequea cantidad de dixido de carbono en la cavidad abdominal. El abdomen es inflado (insuflado) como un globo. Esto eleve la pared abdominal por arriba de los rganos internos como si fuera una bveda. El dixido de carbono es un gas presente en el organismo humano y puede ser absorbido por los tejidos y eliminado a travs del sistema respiratorio. Una vez que se ha llevado a cabo la reparacin, las pequeas incisiones se cerrarn, ya sea con suturas  (puntos) o grapas (igual a las que se usan para el papel, pero aqu se trata de [employer] bordes de la piel unidos). INFORME AL PROFESIONAL SOBRE LOS SIGUIENTES PUNTOS:  Alergias.   Medicamentos que toma, incluyendo hierbas, gotas oftlmicas, medicamentos de venta libre y cremas.   Uso de esteroides (por va oral o cremas).   Problemas anteriores debido a anestsicos o a Patent examiner.   Posibilidad de embarazo, si correspondiera.   Antecedentes de haber tenido cogulos sanguneos (tromboflebitis).   Antecedentes de hemorragias o problemas sanguneos.   Cirugas previas.   Otros problemas de [place].  ANTES DEL PROCEDIMIENTO La laparoscopa podr llevarse a cabo tanto en el hospital como en un consultorio. Podrn administrarle sedante suave que lo ayudar a relajarse antes y durante el procedimiento. Una vez en la sala de operaciones, le darn una anestesia general que lo har dormir (a menos que usted y el profesional elijan otro tipo de anestesia).  DESPUS DEL PROCEDIMIENTO Luego del procedimiento ser llevado a una sala de recuperacin. Segn el tipo de hernia que se haya reparado, deber quedarse en el hospital o se Higher education careers adviser a Charity fundraiser. Con este procedimiento sentir menos dolor y habr Market researcher. Esto por lo general resulta en una recuperacin ms rpida y un menor riesgo de infeccin. INSTRUCCIONES PARA EL CUIDADO DOMICILIARIO  No es necesario hacer reposo en cama. Puede continuar con sus actividades normales pero evite levantar objetos pesados (ms de 5 kg) y el estreimiento.   Tosa con suavidad. Si es fumador, es mejor abandonar el hbito, Civil engineer, contracting  que an despus de reparada, la hernia puede volver a aparecer con la tensin continua de la tos.   Evite conducir hasta que el cirujano lo autorice.   No hay restricciones en la dieta, a menos que le indiquen otra cosa.   TOME TODOS LOS MEDICAMENTOS TAL COMO SE LE INDIC.   Utilice los medicamentos  de venta libre o de prescripcin para Chief Technology Officer, Environmental health practitioner o la [place], segn se lo indique el profesional que lo asiste.  SOLICITE ATENCIN MDICA SI:  Aumenta el dolor abdominal o en las incisiones.   La hemorragia en las incisiones es ms que una pequea [place].   Si se siente confundido o muy dbil.   Siente escalofros o la temperatura oral se eleva por encima de 102 F (38.9 C) en forma inexplicable.   Presenta enrojecimiento, hinchazn o aumento del dolor en la herida.   Aparece pus en la herida.   Un olor ftido proviene de la herida o del vendaje.  SOLICITE ATENCIN MDICA DE INMEDIATO SI:  Aparece una erupcin cutnea.   Presenta dificultades respiratorias.   Tiene algn problema alrgico.  EST SEGURO QUE:   Comprende las instrucciones para el alta mdica.   Controlar su enfermedad.   Solicitar atencin mdica de inmediato segn las indicaciones.  Document Released: 01/14/2005 Document Revised: 01/03/2011 [facility] Patient Information 2012 [place], [place].

## 2011-08-05 ENCOUNTER — Telehealth (INDEPENDENT_AMBULATORY_CARE_PROVIDER_SITE_OTHER): Payer: Self-pay | Admitting: General Surgery

## 2011-08-05 NOTE — Telephone Encounter (Signed)
Patient called in stating that she is having pain and needs additional pain medication. Pt scheduled for surgery on 08/22/11 with Dr. Luisa Hart (ventral hernia repair). Patient stated she is no longer taking Tramadol and she has run out and the doctor that issued that medication has released her from care. She stated that she was told that if she wanted additional pain medication she needed to have is issued by [facility] or pain management. I advised message will be forwarded to Dr. Luisa Hart to determine if medication can be issued.

## 2011-08-05 NOTE — Telephone Encounter (Signed)
Received response from Dr. Luisa Hart regarding the [name] request for additional pain medication. Dr. Luisa Hart stated no additional pain medication will be issued until after the surgery has been performed.   [name] called back and advised of Dr. Rosezena Sensor decision.

## 2011-08-07 ENCOUNTER — Encounter (HOSPITAL_COMMUNITY): Payer: Self-pay | Admitting: Pharmacy Technician

## 2011-08-15 ENCOUNTER — Encounter (HOSPITAL_COMMUNITY): Payer: Self-pay

## 2011-08-15 ENCOUNTER — Encounter (HOSPITAL_COMMUNITY)
Admission: RE | Admit: 2011-08-15 | Discharge: 2011-08-15 | Disposition: A | Discharge status: Home / Self Care | Payer: Medicaid Other | Source: Ambulatory Visit | Attending: Surgery | Admitting: Surgery

## 2011-08-15 LAB — COMPREHENSIVE METABOLIC PANEL
ALT: 272 U/L — ABNORMAL HIGH (ref 0–35)
Albumin: 4.2 g/dL (ref 3.5–5.2)
Alkaline Phosphatase: 90 U/L (ref 39–117)
Glucose, Bld: 95 mg/dL (ref 70–99)
Potassium: 4.4 mEq/L (ref 3.5–5.1)
Sodium: 137 mEq/L (ref 135–145)
Total Protein: 8.1 g/dL (ref 6.0–8.3)

## 2011-08-15 LAB — DIFFERENTIAL
Eosinophils Absolute: 0.1 10*3/uL (ref 0.0–0.7)
Eosinophils Relative: 1 % (ref 0–5)
Lymphs Abs: 1.9 10*3/uL (ref 0.7–4.0)
Monocytes Absolute: 0.7 10*3/uL (ref 0.1–1.0)
Monocytes Relative: 10 % (ref 3–12)

## 2011-08-15 LAB — CBC
Hemoglobin: 14.9 g/dL (ref 12.0–15.0)
MCHC: 34.3 g/dL (ref 30.0–36.0)
RDW: 13.3 % (ref 11.5–15.5)

## 2011-08-15 NOTE — Pre-Procedure Instructions (Signed)
20 [name]  08/15/2011   Your procedure is scheduled on:  08/22/11  Report to [facility] at 700 AM.  Call this number if you have problems the morning of surgery: [phone]   Remember:   Do not eat food:After Midnight.  May have clear liquids:until Midnight .  Clear liquids include soda, tea, black coffee, apple or grape juice, broth.  Take these medicines the morning of surgery with A SIP OF WATER: thorazine,bentyl,cymbalta,prilosec,depakote,ultram   Do not wear jewelry, make-up or nail polish.  Do not wear lotions, powders, or perfumes. You may wear deodorant.  Do not shave 48 hours prior to surgery. Men may shave face and neck.  Do not bring valuables to the hospital.  Contacts, dentures or bridgework may not be worn into surgery.  Leave suitcase in the car. After surgery it may be brought to your room.  For patients admitted to the hospital, checkout time is 11:00 AM the day of discharge.   Patients discharged the day of surgery will not be allowed to drive home.  Name and phone number of your driver: family  Special Instructions: CHG Shower Use Special Wash: 1/2 bottle night before surgery and 1/2 bottle morning of surgery.   Please read over the following fact sheets that you were given: Pain Booklet, Coughing and Deep Breathing, MRSA Information and Surgical Site Infection Prevention

## 2011-08-21 MED ORDER — DEXTROSE 5 % IV SOLN
3.0000 g | INTRAVENOUS | Status: AC
  Administered 2011-08-22: 3 g via INTRAVENOUS
  Filled 2011-08-21: qty 3000

## 2011-08-21 MED ORDER — CHLORHEXIDINE GLUCONATE 4 % EX LIQD: 1.0000 "application " | Freq: Once | CUTANEOUS | Status: DC

## 2011-08-22 ENCOUNTER — Encounter (HOSPITAL_COMMUNITY): Payer: Self-pay | Admitting: Anesthesiology

## 2011-08-22 ENCOUNTER — Encounter (HOSPITAL_COMMUNITY): Payer: Self-pay | Admitting: *Deleted

## 2011-08-22 ENCOUNTER — Ambulatory Visit (HOSPITAL_COMMUNITY): Payer: Medicaid Other | Admitting: Anesthesiology

## 2011-08-22 ENCOUNTER — Encounter (HOSPITAL_COMMUNITY)
Admission: RE | Disposition: A | Discharge status: Home / Self Care | Payer: Self-pay | Source: Ambulatory Visit | Attending: Surgery

## 2011-08-22 ENCOUNTER — Ambulatory Visit (HOSPITAL_COMMUNITY)
Admission: RE | Admit: 2011-08-22 | Discharge: 2011-08-23 | Disposition: A | Discharge status: Home / Self Care | Payer: Medicaid Other | Source: Ambulatory Visit | Attending: Surgery | Admitting: Surgery

## 2011-08-22 DIAGNOSIS — I499 Cardiac arrhythmia, unspecified: Secondary | ICD-10-CM | POA: Insufficient documentation

## 2011-08-22 DIAGNOSIS — F419 Anxiety disorder, unspecified: Secondary | ICD-10-CM

## 2011-08-22 DIAGNOSIS — F3289 Other specified depressive episodes: Secondary | ICD-10-CM | POA: Insufficient documentation

## 2011-08-22 DIAGNOSIS — F079 Unspecified personality and behavioral disorder due to known physiological condition: Secondary | ICD-10-CM

## 2011-08-22 DIAGNOSIS — F4310 Post-traumatic stress disorder, unspecified: Secondary | ICD-10-CM

## 2011-08-22 DIAGNOSIS — Z01812 Encounter for preprocedural laboratory examination: Secondary | ICD-10-CM | POA: Insufficient documentation

## 2011-08-22 DIAGNOSIS — K828 Other specified diseases of gallbladder: Secondary | ICD-10-CM

## 2011-08-22 DIAGNOSIS — K439 Ventral hernia without obstruction or gangrene: Secondary | ICD-10-CM

## 2011-08-22 DIAGNOSIS — F329 Major depressive disorder, single episode, unspecified: Secondary | ICD-10-CM | POA: Insufficient documentation

## 2011-08-22 DIAGNOSIS — I10 Essential (primary) hypertension: Secondary | ICD-10-CM | POA: Insufficient documentation

## 2011-08-22 DIAGNOSIS — Z9889 Other specified postprocedural states: Secondary | ICD-10-CM

## 2011-08-22 HISTORY — PX: HERNIA REPAIR: SHX51

## 2011-08-22 HISTORY — PX: VENTRAL HERNIA REPAIR: SHX424

## 2011-08-22 SURGERY — REPAIR, HERNIA, VENTRAL, LAPAROSCOPIC
Anesthesia: General | Wound class: Clean

## 2011-08-22 MED ORDER — HYDROMORPHONE HCL PF 1 MG/ML IJ SOLN
1.0000 mg | INTRAMUSCULAR | Status: DC | PRN
  Administered 2011-08-22 (×2): 1 mg via INTRAVENOUS
  Filled 2011-08-22 (×2): qty 1

## 2011-08-22 MED ORDER — DICYCLOMINE HCL 20 MG PO TABS
20.0000 mg | ORAL_TABLET | Freq: Four times a day (QID) | ORAL | Status: DC
  Administered 2011-08-22 – 2011-08-23 (×3): 20 mg via ORAL
  Filled 2011-08-22 (×7): qty 1

## 2011-08-22 MED ORDER — PROPOFOL 10 MG/ML IV BOLUS
INTRAVENOUS | Status: DC | PRN
  Administered 2011-08-22: 140 mg via INTRAVENOUS

## 2011-08-22 MED ORDER — DULOXETINE HCL 60 MG PO CPEP
60.0000 mg | ORAL_CAPSULE | Freq: Every day | ORAL | Status: DC
  Administered 2011-08-22 – 2011-08-23 (×2): 60 mg via ORAL
  Filled 2011-08-22 (×2): qty 1

## 2011-08-22 MED ORDER — HYDROMORPHONE HCL PF 1 MG/ML IJ SOLN
INTRAMUSCULAR | Status: AC
  Filled 2011-08-22: qty 1

## 2011-08-22 MED ORDER — OXYCODONE-ACETAMINOPHEN 5-325 MG PO TABS
ORAL_TABLET | ORAL | Status: AC
Start: 2011-08-22 — End: 2011-08-22
  Filled 2011-08-22: qty 2

## 2011-08-22 MED ORDER — LISINOPRIL 10 MG PO TABS
10.0000 mg | ORAL_TABLET | Freq: Every day | ORAL | Status: DC
  Filled 2011-08-22 (×2): qty 1

## 2011-08-22 MED ORDER — CHLORPROMAZINE HCL 50 MG PO TABS
50.0000 mg | ORAL_TABLET | Freq: Three times a day (TID) | ORAL | Status: DC
  Administered 2011-08-22 – 2011-08-23 (×3): 50 mg via ORAL
  Filled 2011-08-22 (×6): qty 1

## 2011-08-22 MED ORDER — OXYCODONE-ACETAMINOPHEN 5-325 MG PO TABS
1.0000 | ORAL_TABLET | ORAL | Status: DC | PRN
  Administered 2011-08-22 – 2011-08-23 (×3): 2 via ORAL
  Filled 2011-08-22 (×2): qty 2

## 2011-08-22 MED ORDER — HYDROMORPHONE HCL PF 1 MG/ML IJ SOLN
0.2500 mg | INTRAMUSCULAR | Status: DC | PRN
  Administered 2011-08-22 (×2): 0.5 mg via INTRAVENOUS
  Administered 2011-08-22: 1 mg via INTRAVENOUS

## 2011-08-22 MED ORDER — LIDOCAINE HCL (CARDIAC) 20 MG/ML IV SOLN
INTRAVENOUS | Status: DC | PRN
  Administered 2011-08-22: 60 mg via INTRAVENOUS

## 2011-08-22 MED ORDER — GLYCOPYRROLATE 0.2 MG/ML IJ SOLN
INTRAMUSCULAR | Status: DC | PRN
  Administered 2011-08-22: 0.2 mg via INTRAVENOUS

## 2011-08-22 MED ORDER — KCL-LACTATED RINGERS-D5W 20 MEQ/L IV SOLN
INTRAVENOUS | Status: DC
  Administered 2011-08-22 – 2011-08-23 (×2): via INTRAVENOUS
  Filled 2011-08-22 (×4): qty 1000

## 2011-08-22 MED ORDER — ONDANSETRON HCL 4 MG/2ML IJ SOLN
INTRAMUSCULAR | Status: DC | PRN
  Administered 2011-08-22: 4 mg via INTRAVENOUS

## 2011-08-22 MED ORDER — ACETAMINOPHEN 10 MG/ML IV SOLN: 1000.0000 mg | Freq: Once | INTRAVENOUS | Status: DC | PRN

## 2011-08-22 MED ORDER — PANTOPRAZOLE SODIUM 40 MG PO TBEC
40.0000 mg | DELAYED_RELEASE_TABLET | Freq: Every day | ORAL | Status: DC
  Administered 2011-08-22: 40 mg via ORAL
  Filled 2011-08-22: qty 1

## 2011-08-22 MED ORDER — 0.9 % SODIUM CHLORIDE (POUR BTL) OPTIME
TOPICAL | Status: DC | PRN
  Administered 2011-08-22: 2000 mL

## 2011-08-22 MED ORDER — LACTATED RINGERS IV SOLN
INTRAVENOUS | Status: DC
  Administered 2011-08-22 (×2): via INTRAVENOUS

## 2011-08-22 MED ORDER — FENTANYL CITRATE 0.05 MG/ML IJ SOLN
INTRAMUSCULAR | Status: DC | PRN
  Administered 2011-08-22: 50 ug via INTRAVENOUS
  Administered 2011-08-22: 200 ug via INTRAVENOUS

## 2011-08-22 MED ORDER — PHENYLEPHRINE HCL 10 MG/ML IJ SOLN
INTRAMUSCULAR | Status: DC | PRN
  Administered 2011-08-22 (×2): 80 ug via INTRAVENOUS

## 2011-08-22 MED ORDER — DEXAMETHASONE SODIUM PHOSPHATE 4 MG/ML IJ SOLN
INTRAMUSCULAR | Status: DC | PRN
  Administered 2011-08-22: 4 mg via INTRAVENOUS

## 2011-08-22 MED ORDER — ONDANSETRON HCL 4 MG/2ML IJ SOLN: 4.0000 mg | Freq: Once | INTRAMUSCULAR | Status: DC | PRN

## 2011-08-22 MED ORDER — ENOXAPARIN SODIUM 40 MG/0.4ML ~~LOC~~ SOLN
40.0000 mg | SUBCUTANEOUS | Status: DC
  Administered 2011-08-23: 40 mg via SUBCUTANEOUS
  Filled 2011-08-22 (×2): qty 0.4

## 2011-08-22 MED ORDER — TRAMADOL HCL 50 MG PO TABS: 50.0000 mg | ORAL_TABLET | Freq: Four times a day (QID) | ORAL | Status: DC | PRN

## 2011-08-22 MED ORDER — BUPIVACAINE-EPINEPHRINE 0.25% -1:200000 IJ SOLN
INTRAMUSCULAR | Status: DC | PRN
  Administered 2011-08-22: 28 mL

## 2011-08-22 MED ORDER — BUPIVACAINE-EPINEPHRINE PF 0.25-1:200000 % IJ SOLN
INTRAMUSCULAR | Status: AC
  Filled 2011-08-22: qty 30

## 2011-08-22 MED ORDER — ROCURONIUM BROMIDE 100 MG/10ML IV SOLN
INTRAVENOUS | Status: DC | PRN
  Administered 2011-08-22: 40 mg via INTRAVENOUS

## 2011-08-22 MED ORDER — DIVALPROEX SODIUM ER 500 MG PO TB24
750.0000 mg | ORAL_TABLET | Freq: Every day | ORAL | Status: DC
  Administered 2011-08-22: 750 mg via ORAL
  Filled 2011-08-22 (×2): qty 1

## 2011-08-22 MED ORDER — MIDAZOLAM HCL 5 MG/5ML IJ SOLN
INTRAMUSCULAR | Status: DC | PRN
  Administered 2011-08-22: 2 mg via INTRAVENOUS

## 2011-08-22 MED ORDER — TRAZODONE HCL 100 MG PO TABS
100.0000 mg | ORAL_TABLET | Freq: Every day | ORAL | Status: DC
  Administered 2011-08-22: 100 mg via ORAL
  Filled 2011-08-22 (×2): qty 1

## 2011-08-22 SURGICAL SUPPLY — 53 items
APPLIER CLIP ROT 10 11.4 M/L (STAPLE)
BINDER ABD UNIV 12 45-62 (WOUND CARE) ×2 IMPLANT
BINDER ABDOMINAL 46IN 62IN (WOUND CARE) ×4
BLADE SURG ROTATE 9660 (MISCELLANEOUS) IMPLANT
CANISTER SUCTION 2500CC (MISCELLANEOUS) ×2 IMPLANT
CHLORAPREP W/TINT 26ML (MISCELLANEOUS) ×2 IMPLANT
CLIP APPLIE ROT 10 11.4 M/L (STAPLE) IMPLANT
CLOTH BEACON ORANGE TIMEOUT ST (SAFETY) ×2 IMPLANT
COVER SURGICAL LIGHT HANDLE (MISCELLANEOUS) ×2 IMPLANT
DECANTER SPIKE VIAL GLASS SM (MISCELLANEOUS) ×2 IMPLANT
DERMABOND ADVANCED (GAUZE/BANDAGES/DRESSINGS) ×2
DERMABOND ADVANCED .7 DNX12 (GAUZE/BANDAGES/DRESSINGS) ×2 IMPLANT
DEVICE SECURE STRAP 25 ABSORB (INSTRUMENTS) ×4 IMPLANT
DEVICE TROCAR PUNCTURE CLOSURE (ENDOMECHANICALS) ×2 IMPLANT
DRAPE UTILITY 15X26 W/TAPE STR (DRAPE) ×4 IMPLANT
DRAPE WARM FLUID 44X44 (DRAPE) ×2 IMPLANT
ELECT REM PT RETURN 9FT ADLT (ELECTROSURGICAL) ×2
ELECTRODE REM PT RTRN 9FT ADLT (ELECTROSURGICAL) ×1 IMPLANT
GLOVE BIOGEL PI IND STRL 6.5 (GLOVE) ×1 IMPLANT
GLOVE BIOGEL PI IND STRL 7.5 (GLOVE) ×1 IMPLANT
GLOVE BIOGEL PI IND STRL 8 (GLOVE) ×1 IMPLANT
GLOVE BIOGEL PI INDICATOR 6.5 (GLOVE) ×1
GLOVE BIOGEL PI INDICATOR 7.5 (GLOVE) ×1
GLOVE BIOGEL PI INDICATOR 8 (GLOVE) ×1
GLOVE EXAM NITRILE MD LF STRL (GLOVE) ×2 IMPLANT
GLOVE SURG SS PI 6.5 STRL IVOR (GLOVE) ×2 IMPLANT
GLOVE SURG SS PI 7.5 STRL IVOR (GLOVE) ×2 IMPLANT
GLOVE SURG SS PI 8.0 STRL IVOR (GLOVE) ×2 IMPLANT
GOWN STRL NON-REIN LRG LVL3 (GOWN DISPOSABLE) ×6 IMPLANT
KIT BASIN OR (CUSTOM PROCEDURE TRAY) ×2 IMPLANT
KIT ROOM TURNOVER OR (KITS) ×2 IMPLANT
MESH PARIETEX 4.7 (Mesh General) ×2 IMPLANT
NEEDLE SPNL 22GX3.5 QUINCKE BK (NEEDLE) ×2 IMPLANT
NS IRRIG 1000ML POUR BTL (IV SOLUTION) ×2 IMPLANT
PAD ARMBOARD 7.5X6 YLW CONV (MISCELLANEOUS) ×2 IMPLANT
PEN SKIN MARKING BROAD (MISCELLANEOUS) ×2 IMPLANT
PENCIL BUTTON BLDE SNGL 10FT (ELECTRODE) ×2 IMPLANT
SCALPEL HARMONIC ACE (MISCELLANEOUS) IMPLANT
SET IRRIG TUBING LAPAROSCOPIC (IRRIGATION / IRRIGATOR) IMPLANT
SLEEVE ENDOPATH XCEL 5M (ENDOMECHANICALS) ×4 IMPLANT
SUT MON AB 4-0 PC3 18 (SUTURE) ×4 IMPLANT
SUT NOVA NAB DX-16 0-1 5-0 T12 (SUTURE) ×4 IMPLANT
SUT VIC AB 3-0 SH 18 (SUTURE) IMPLANT
TOWEL OR 17X24 6PK STRL BLUE (TOWEL DISPOSABLE) ×2 IMPLANT
TOWEL OR 17X26 10 PK STRL BLUE (TOWEL DISPOSABLE) ×2 IMPLANT
TRAY LAPAROSCOPIC (CUSTOM PROCEDURE TRAY) ×2 IMPLANT
TROCAR XCEL 12X100 BLDLESS (ENDOMECHANICALS) IMPLANT
TROCAR XCEL BLUNT TIP 100MML (ENDOMECHANICALS) IMPLANT
TROCAR XCEL NON-BLD 11X100MML (ENDOMECHANICALS) ×2 IMPLANT
TROCAR XCEL NON-BLD 5MMX100MML (ENDOMECHANICALS) ×2 IMPLANT
TUBE CONNECTING 12X1/4 (SUCTIONS) ×2 IMPLANT
WATER STERILE IRR 1000ML POUR (IV SOLUTION) IMPLANT
YANKAUER SUCT BULB TIP NO VENT (SUCTIONS) ×2 IMPLANT

## 2011-08-22 NOTE — Anesthesia Postprocedure Evaluation (Signed)
  Anesthesia Post-op Note  Patient: [name]  Procedure(s) Performed: Procedure(s) (LRB): LAPAROSCOPIC VENTRAL HERNIA (N/A)  Patient Location: PACU  Anesthesia Type: General  Level of Consciousness: awake, alert  and oriented  Airway and Oxygen Therapy: Patient Spontanous Breathing and Patient connected to nasal cannula oxygen  Post-op Pain: mild  Post-op Assessment: Post-op Vital signs reviewed and Patient's Cardiovascular Status Stable  Post-op Vital Signs: stable  Complications: No apparent anesthesia complications

## 2011-08-22 NOTE — Preoperative (Signed)
Beta Blockers   Reason not to administer Beta Blockers:Not Applicable 

## 2011-08-22 NOTE — H&P (View-Only) (Signed)
Patient ID: [name], female   DOB: [birth year], 41 y.o.   MRN: [id number]  No chief complaint on file.   HPI [name] is a 41 y.o. female.   HPIThe patient returns in followup due to a bulge located in the lower abdominal wall left of midline within previous abdominoplasty scar. She has pain which is getting worse and a bulge made worse with coughing or straining. She has a history of chronic constipation. She had a cholecystectomy earlier this year for dyskinesia.  Past Medical History  Diagnosis Date  . Dysrhythmia     not followed by cardiogist  . Anemia   . Blood transfusion   . Seizures   . Headache   . Depression   . Hypertension     Dr. Knox Royalty [phone]  . Abdominal pain     takes omeprazole    Past Surgical History  Procedure Date  . Abdominal hysterectomy   . Cesarean section     x2  . Abdominoplasty   . Cervical spine surgery     2002  . Shoulder surgery     left humeral head replacement 2001  . Breast surgery     breast implants x 2  . Cholecystectomy 02/28/2011    Procedure: LAPAROSCOPIC CHOLECYSTECTOMY;  Surgeon: Clovis Pu. Dwane Andres, MD;  Location: [facility] OR;  Service: General;  Laterality: N/A;  Laparoscopic cholecystectomy.    Family History  Problem Relation Age of Onset  . Cancer Mother     colon, skin  . Anesthesia problems Mother   . Heart disease Father     Social History History  Substance Use Topics  . Smoking status: Never Smoker   . Smokeless tobacco: Never Used  . Alcohol Use: No    Allergies  Allergen Reactions  . Latex Anaphylaxis  . Naproxen Anaphylaxis      Review of Systems Review of Systems  Constitutional: Negative.   HENT: Negative.   Eyes: Negative.   Respiratory: Negative.   Cardiovascular: Negative.   Gastrointestinal: Positive for abdominal pain and constipation.  Genitourinary: Negative.   Musculoskeletal: Negative.   Skin: Negative.   Neurological: Negative.     Hematological: Negative.   Psychiatric/Behavioral: Negative.     Blood pressure 126/84, pulse 84, temperature 97.2 F (36.2 C), resp. rate 16, height 5\' 8"  (1.727 m), weight 162 lb 12.8 oz (73.846 kg).  Physical Exam Physical Exam  Constitutional: She is oriented to person, place, and time. She appears well-developed and well-nourished.  HENT:  Head: Normocephalic and atraumatic.  Eyes: EOM are normal. Pupils are equal, round, and reactive to light.  Neck: Normal range of motion. Neck supple.  Cardiovascular: Normal rate and regular rhythm.   Pulmonary/Chest: Effort normal and breath sounds normal.  Abdominal:    Musculoskeletal: Normal range of motion.  Neurological: She is alert and oriented to person, place, and time.  Skin: Skin is warm and dry.  Psychiatric: She has a normal mood and affect. Her behavior is normal. Judgment and thought content normal.      Assessment    VENTRAL HERNIA LEFT LOWER QUADRATNT Plan    Laparoscopy with potential laparoscopic repair of ventral hernia with mesh located left lower quadrant along abdominoplasty incision.The risk of hernia repair include bleeding,  Infection,   Recurrence of the hernia,  Mesh use, chronic pain,  Organ injury,  Bowel injury,  Bladder injury,   nerve injury with numbness around the incision,  Death,  and worsening of preexisting  medical problems.  The alternatives to surgery have been discussed as well..  Long term expectations of both operative and non operative treatments have been discussed.   The patient agrees to proceed.       Erek Kowal A. 08/02/2011, 10:37 AM

## 2011-08-22 NOTE — Anesthesia Procedure Notes (Signed)
Procedure Name: Intubation Date/Time: 08/22/2011 9:22 AM Performed by: Marena Chancy Pre-anesthesia Checklist: Patient identified, Timeout performed, Emergency Drugs available, Patient being monitored and Suction available Patient Re-evaluated:Patient Re-evaluated prior to inductionOxygen Delivery Method: Circle system utilized Preoxygenation: Pre-oxygenation with 100% oxygen Intubation Type: IV induction Ventilation: Mask ventilation without difficulty Laryngoscope Size: Miller and 2 Grade View: Grade I Tube type: Oral Tube size: 7.5 mm Number of attempts: 1 Placement Confirmation: ETT inserted through vocal cords under direct vision,  breath sounds checked- equal and bilateral and positive ETCO2 Secured at: 22 cm Tube secured with: Tape Dental Injury: Teeth and Oropharynx as per pre-operative assessment

## 2011-08-22 NOTE — Interval H&P Note (Signed)
History and Physical Interval Note:  08/22/2011 8:34 AM  [name]  has presented today for surgery, with the diagnosis of ventral hernia  The various methods of treatment have been discussed with the patient and family. After consideration of risks, benefits and other options for treatment, the patient has consented to  Procedure(s) (LRB): LAPAROSCOPIC VENTRAL HERNIA (N/A) as a surgical intervention .  The patient's history has been reviewed, patient examined, no change in status, stable for surgery.  I have reviewed the patient's chart and labs.  Questions were answered to the patient's satisfaction.     Aairah Negrette A.

## 2011-08-22 NOTE — Transfer of Care (Signed)
Immediate Anesthesia Transfer of Care Note  Patient: [name]  Procedure(s) Performed: Procedure(s) (LRB): LAPAROSCOPIC VENTRAL HERNIA (N/A)  Patient Location: PACU  Anesthesia Type: General  Level of Consciousness: awake, alert  and oriented  Airway & Oxygen Therapy: Patient Spontanous Breathing  Post-op Assessment: Report given to PACU RN, Post -op Vital signs reviewed and stable and Patient moving all extremities X 4  Post vital signs: Reviewed and stable  Complications: No apparent anesthesia complications

## 2011-08-22 NOTE — Anesthesia Preprocedure Evaluation (Signed)
Anesthesia Evaluation  Patient identified by MRN, date of birth, ID band Patient awake    Reviewed: Allergy & Precautions, H&P , NPO status , Patient's Chart, lab work & pertinent test results  Airway Mallampati: I TM Distance: >3 FB     Dental  (+) Teeth Intact and Dental Advisory Given   Pulmonary  breath sounds clear to auscultation        Cardiovascular Rhythm:Regular Rate:Normal     Neuro/Psych    GI/Hepatic   Endo/Other    Renal/GU      Musculoskeletal   Abdominal   Peds  Hematology   Anesthesia Other Findings   Reproductive/Obstetrics                           Anesthesia Physical Anesthesia Plan  ASA: II  Anesthesia Plan: General   Post-op Pain Management:    Induction: Intravenous  Airway Management Planned: Oral ETT  Additional Equipment:   Intra-op Plan:   Post-operative Plan: Extubation in OR  Informed Consent:   Dental advisory given  Plan Discussed with: CRNA and Surgeon  Anesthesia Plan Comments:         Anesthesia Quick Evaluation

## 2011-08-22 NOTE — Op Note (Signed)
Prep and diagnosis: Left lower quadrant abdominal wall hernia  Postoperative diagnosis: Same  Procedure: Laparoscopic ventral hernia repair with Parietex composite mesh 10 cm x 10 cm  Surgeon: Harriette Bouillon M.D.  Anesthesia: Gen. Endotracheal anesthesia with 0.25% Sensorcaine local with epinephrine  EBL: Minimal  Drains: None  Specimen: None  IV fluids: 2000 cc crystalloid  Indications for procedure: The patient was seen in the office to to a bulge in her left lower quadrant abdominal wall below a abdominoplasty scar. CT scan showed no hernia but there was a significant bulge and discomfort 3 cm in size on clinical exam. I recommended laparoscopy with possible laparoscopic repair since he was symptomatic. Risk include bleeding, infection, injury to the intestine, further surgery, injury to the bladder, injury to major blood vessels, injury to major nurse causing postoperative pain numbness, the need for open procedure, DVT, death, worsening of previous condition, and hernia recurrence. Also discussed bowel obstruction scar tissue formation. She voiced understanding to proceed. Medical management also offered.  Description of procedure: The patient was met in the holding area and questions are answered. He is taken back to the operating room and placed upon the operating room table. After induction of general anesthesia, Foley catheter was placed under sterile conditions. The abdomen was prepped and draped in a sterile fashion. Timeout was done and she received preoperative antibiotics. A 5 mm Optiview was placed in the right abdomen mid axillary line just below the right costal margin. The port was advanced under direct vision into we into the abdominal cavity. We then insufflated to 15 mm mercury of CO2 pressure. Inspection revealed no injury to the bowel or other internal viscera. The left lower quadrant was a 3 cm hernia. The abdominal wall was quite thin here as well. I placed a left upper  quadrant 5 mm port. A third 5 mm port was placed the right upper quadrant. A 10 cm x 10 cm pareitex mesh was placed in saline and then 4 anchoring sutures were placed in the mesh using #1 Novafil. Small incision was made of the hernia defect dissection was carried down. The anterior fascia was intact the below this the posterior fascia was nonexistent for about 3 cm corresponding to a bulge hernia defect. I opened his to enter the abdominal cavity. The mesh was placed the defect I approximated the fascial edges with #1 Novafil suture. We then closed the subcutaneous fat and skin with 4-0 Monocryl. I then reinsufflated. The mesh was then flattened out with the nonadhesive side toward the small intestine. The 4 sutures were grasped by a suture grasper through 4 separate stab incisions around the hernia defect and pulled up. The mesh laid quite flush with at least 4 cm of override circumferentially. A suture strap tacker was used to secure the edges circumferentially. Care was taken to avoid the bladder inferiorly. As well to left and superior to the bladder. The mesh laid flat and covered the defect quite nicely. The colon and small bowel were examined carefully as well as the rectum and no evidence of injury was noted. The bladder was inspected and found to be well decompressed away from the operative field. 4 quadrant laparoscopy revealed no evidence of injury to internal viscera. No other intra-abdominal abnormalities were noted. The ports are then removed allowing the CO2 to escape. No signs of port site bleeding. 4 Monocryl used to close the skin. Dermabond applied. All final counts sponge, needle instruments found to be correct. Patient awoke extubated taken  recovery in satisfactory condition. Foley removed and the case. No evidence of blood in the urine.

## 2011-08-23 ENCOUNTER — Encounter (HOSPITAL_COMMUNITY): Payer: Self-pay | Admitting: Surgery

## 2011-08-23 ENCOUNTER — Telehealth (INDEPENDENT_AMBULATORY_CARE_PROVIDER_SITE_OTHER): Payer: Self-pay

## 2011-08-23 MED ORDER — POLYETHYLENE GLYCOL 3350 17 G PO PACK: 17.0000 g | PACK | Freq: Every day | ORAL | Status: DC

## 2011-08-23 MED ORDER — POLYETHYLENE GLYCOL 3350 17 G PO PACK
17.0000 g | PACK | Freq: Every day | ORAL | Status: DC
  Filled 2011-08-23 (×3): qty 1

## 2011-08-23 MED ORDER — HYDROMORPHONE HCL 2 MG PO TABS: 2.0000 mg | ORAL_TABLET | ORAL | Status: DC | PRN

## 2011-08-23 MED ORDER — HYDROMORPHONE HCL 2 MG PO TABS
2.0000 mg | ORAL_TABLET | ORAL | Status: DC | PRN
  Administered 2011-08-23: 2 mg via ORAL
  Filled 2011-08-23: qty 1

## 2011-08-23 MED ORDER — SODIUM CHLORIDE 0.9 % IV BOLUS (SEPSIS)
500.0000 mL | Freq: Once | INTRAVENOUS | Status: AC
  Administered 2011-08-23: 500 mL via INTRAVENOUS

## 2011-08-23 NOTE — Progress Notes (Signed)
Discharge home.

## 2011-08-23 NOTE — Telephone Encounter (Signed)
We received an E-scribe error for Polyethylene Glycol (Miralax) 17g by mouth daily dispense 14 with 3 refills.  This was ordered by Dr Luisa Hart.  I called it in to the [facility] [phone]

## 2011-08-23 NOTE — Discharge Summary (Signed)
Physician Discharge Summary  Patient ID: Jaslynn Thome MRN: [id number] DOB/AGE: 07-04-1970 41 y.o.  Admit date: 08/22/2011 Discharge date: 08/23/2011  Admission Diagnoses:VENTRAL HERNIA  Discharge Diagnoses: SAME Active Problems:  * No active hospital problems. *    Discharged Condition: good  Hospital Course: Unremarkable.  Pain control adequate.  Vitals stable.  Wounds clean dry intact. Tolerating diet.  Consults: None  Significant Diagnostic Studies: none  Treatments: surgery: laparoscopic incisional hernia repair  Discharge Exam: Blood pressure 90/47, pulse 75, temperature 98.4 F (36.9 C), temperature source Oral, resp. rate 16, SpO2 98.00%. Incision/Wound:clean dry intact. Soft abdomen no peritonitis  Disposition: 01-Home or Self Care  Discharge Orders    Future Appointments: Provider: Department: Dept Phone: Center:   09/06/2011 9:00 AM Maisie Fus A. Refoel Palladino, MD [facility] [phone] None     Future Orders Please Complete By Expires   Diet - low sodium heart healthy      Increase activity slowly      Lifting restrictions      Comments:   No lifting or pushing for 3 weeks   Driving Restrictions      Comments:   No driving for 2 weeks   Discharge instructions      Comments:   Shower today.  Wear binder at all times except to shower or sleep.  Walk daily.  Steps OK.       Narya, Beavin  Home Medication Instructions [id number]   Printed on:08/23/11 0758  Medication Information                    traMADol (ULTRAM) 50 MG tablet Take 50 mg by mouth 2 (two) times daily as needed. For pain Maximum dose= 8 tablets per day           dicyclomine (BENTYL) 20 MG tablet Take 1 tablet (20 mg total) by mouth every 6 (six) hours. For smooth bowel function           divalproex (DEPAKOTE ER) 250 MG 24 hr tablet Take 3 tablets (750 mg total) by mouth at bedtime. For seizures, racing thoughts, anxiety, staring spells           DULoxetine  (CYMBALTA) 60 MG capsule Take 1 capsule (60 mg total) by mouth daily. For depression.           omeprazole (PRILOSEC) 40 MG capsule Take 1 capsule (40 mg total) by mouth 2 (two) times daily. For control of stomach acid secretion and helps GERD.           chlorproMAZINE (THORAZINE) 50 MG tablet Take 1 tablet (50 mg total) by mouth 3 (three) times daily. For anxiety           traZODone (DESYREL) 100 MG tablet Take 100 mg by mouth at bedtime.           lisinopril (PRINIVIL,ZESTRIL) 10 MG tablet Take 10 mg by mouth daily.           HYDROmorphone (DILAUDID) 2 MG tablet Take 1 tablet (2 mg total) by mouth every 4 (four) hours as needed.           polyethylene glycol (MIRALAX / GLYCOLAX) packet Take 17 g by mouth daily.            Follow-up Information    Follow up with Paisley Grajeda A., MD. Schedule an appointment as soon as possible for a visit in 2 weeks.   Contact information:   [employer] [address] [employer]  [place] [address] [phone]          Signed: Harriette Bouillon A. 08/23/2011, 7:36 AM

## 2011-08-26 ENCOUNTER — Encounter (INDEPENDENT_AMBULATORY_CARE_PROVIDER_SITE_OTHER): Payer: Medicaid Other | Admitting: Surgery

## 2011-08-26 ENCOUNTER — Telehealth (INDEPENDENT_AMBULATORY_CARE_PROVIDER_SITE_OTHER): Payer: Self-pay

## 2011-08-26 DIAGNOSIS — R112 Nausea with vomiting, unspecified: Secondary | ICD-10-CM

## 2011-08-26 NOTE — Telephone Encounter (Signed)
The pt called to report her Hydromorphone is not helping her pain.  It is making her dizzy, nauseated and vomiting.  She has no fever.  She hasn't had a bowel movement since Tuesday.  She is taking the Miralax qid without relief.  She uses [facility] [address].  I paged Dr Luisa Hart.

## 2011-08-26 NOTE — Telephone Encounter (Signed)
Dr Luisa Hart called back and he would like to see her because she is vomiting and hasn't had a bowel movement.  He wants a plain abdominal xray before she comes.  I will notify the patient and bring her in at 4 pm.   I called the patient and she can't come today.  I asked Dr Luisa Hart if tomorrow is ok and he said ok but she needs a CBC and CMET along with the xray before she comes.  I have placed the orders.  I notified the patient and gave her the information about going to [facility] for the xray and [facility] for the blood work.  I asked her to come here for an appointment at 4:20.  Dr Luisa Hart gave an order for Vicodin per Protocol and Ultram 50 mg po q 6 hours prn pain #30.  I called these in to [facility] on [address] [phone].

## 2011-08-27 ENCOUNTER — Ambulatory Visit
Admission: RE | Admit: 2011-08-27 | Discharge: 2011-08-27 | Disposition: A | Discharge status: Home / Self Care | Payer: Medicaid Other | Source: Ambulatory Visit | Attending: Surgery | Admitting: Surgery

## 2011-08-27 ENCOUNTER — Encounter (INDEPENDENT_AMBULATORY_CARE_PROVIDER_SITE_OTHER): Payer: Self-pay | Admitting: Surgery

## 2011-08-27 ENCOUNTER — Encounter (INDEPENDENT_AMBULATORY_CARE_PROVIDER_SITE_OTHER): Payer: Medicaid Other | Admitting: Surgery

## 2011-08-27 ENCOUNTER — Ambulatory Visit (INDEPENDENT_AMBULATORY_CARE_PROVIDER_SITE_OTHER): Payer: Medicaid Other | Admitting: Surgery

## 2011-08-27 VITALS — BP 122/84 | HR 84 | Temp 97.7°F | Resp 16 | Ht 66.0 in | Wt 162.8 lb

## 2011-08-27 DIAGNOSIS — K5909 Other constipation: Secondary | ICD-10-CM

## 2011-08-27 DIAGNOSIS — R112 Nausea with vomiting, unspecified: Secondary | ICD-10-CM

## 2011-08-27 DIAGNOSIS — K5903 Drug induced constipation: Secondary | ICD-10-CM

## 2011-08-27 LAB — COMPREHENSIVE METABOLIC PANEL
ALT: 567 U/L — ABNORMAL HIGH (ref 0–35)
Albumin: 4.4 g/dL (ref 3.5–5.2)
BUN: 13 mg/dL (ref 6–23)
CO2: 26 mEq/L (ref 19–32)
Calcium: 9.2 mg/dL (ref 8.4–10.5)
Chloride: 101 mEq/L (ref 96–112)
Creat: 0.63 mg/dL (ref 0.50–1.10)

## 2011-08-27 LAB — CBC WITH DIFFERENTIAL/PLATELET
Eosinophils Relative: 2 % (ref 0–5)
HCT: 41.1 % (ref 36.0–46.0)
Hemoglobin: 13.5 g/dL (ref 12.0–15.0)
Lymphocytes Relative: 27 % (ref 12–46)
Lymphs Abs: 1.4 10*3/uL (ref 0.7–4.0)
MCV: 96.9 fL (ref 78.0–100.0)
Monocytes Absolute: 0.6 10*3/uL (ref 0.1–1.0)
RBC: 4.24 MIL/uL (ref 3.87–5.11)
WBC: 5.4 10*3/uL (ref 4.0–10.5)

## 2011-08-27 MED ORDER — METOCLOPRAMIDE HCL 10 MG PO TABS
10.0000 mg | ORAL_TABLET | Freq: Four times a day (QID) | ORAL | Status: DC
Start: 2011-08-27 — End: 2012-11-16

## 2011-08-27 MED ORDER — OXYCODONE HCL 10 MG PO TB12: 10.0000 mg | ORAL_TABLET | Freq: Three times a day (TID) | ORAL | Status: AC | PRN

## 2011-08-27 NOTE — Patient Instructions (Addendum)
Constipation in Adults Constipation is having fewer than 2 bowel movements per week. Usually, the stools are hard. As we grow older, constipation is more common. If you try to fix constipation with laxatives, the problem may get worse. This is because laxatives taken over a long period of time make the colon muscles weaker. A low-fiber diet, not taking in enough fluids, and taking some medicines may make these problems worse. MEDICATIONS THAT MAY CAUSE CONSTIPATION  Water pills (diuretics).   Calcium channel blockers (used to control blood pressure and for the heart).   Certain pain medicines (narcotics).   Anticholinergics.   Anti-inflammatory agents.   Antacids that contain aluminum.  DISEASES THAT CONTRIBUTE TO CONSTIPATION  Diabetes.   Parkinson's disease.   Dementia.   Stroke.   Depression.   Illnesses that cause problems with salt and water metabolism.  HOME CARE INSTRUCTIONS   Constipation is usually best cared for without medicines. Increasing dietary fiber and eating more fruits and vegetables is the best way to manage constipation.   Slowly increase fiber intake to 25 to 38 grams per day. Whole grains, fruits, vegetables, and legumes are good sources of fiber. A dietitian can further help you incorporate high-fiber foods into your diet.   Drink enough water and fluids to keep your urine clear or pale yellow.   A fiber supplement may be added to your diet if you cannot get enough fiber from foods.   Increasing your activities also helps improve regularity.   Suppositories, as suggested by your caregiver, will also help. If you are using antacids, such as aluminum or calcium containing products, it will be helpful to switch to products containing magnesium if your caregiver says it is okay.   If you have been given a liquid injection (enema) today, this is only a temporary measure. It should not be relied on for treatment of longstanding (chronic) constipation.    Stronger measures, such as magnesium sulfate, should be avoided if possible. This may cause uncontrollable diarrhea. Using magnesium sulfate may not allow you time to make it to the bathroom.  SEEK IMMEDIATE MEDICAL CARE IF:   There is bright red blood in the stool.   The constipation stays for more than 4 days.   There is belly (abdominal) or rectal pain.   You do not seem to be getting better.   You have any questions or concerns.  MAKE SURE YOU:   Understand these instructions.   Will watch your condition.   Will get help right away if you are not doing well or get worse.  Document Released: 10/13/2003 Document Revised: 01/03/2011 Document Reviewed: 12/18/2010 [facility] Patient Information 2012 [place], [place].   Enema per rectum daily until you have a BM

## 2011-08-27 NOTE — Progress Notes (Signed)
Patient returns to clinic one week after laparoscopic left lower quadrant incisional hernia repair. She's not had a bowel movement in the last 6 - 7 days  she states. She also has nausea vomiting and feels weak. She is urinating quite a bit and keeping liquids down it sounds like. She went for a KUB today which showed significant stool in the right colon and gas in the colon but no signs of small bowel obstruction. She comes in today for followup. Her bowel sounds are normal. She looks well.  Exam: Abdomen is flat. It is nondistended. Incisions clean dry and intact. No signs of infection. No evidence of peritonitis.  Labs: Pending  KUB significant stool the burden of right colon with significant colonic gas consistent with constipation  Impression: Postoperative constipation  Plan: We'll change pain medicine to oxycodone 10 mg every 4 hours as needed and stopped the dilaudid.  Add reglan for nausea.  We'll try to gently bowel prep her  slowly over the next 24-48 hours. Recommend enema when she gets home. Labs are pending and we'll follow up  when these  return but she does not appear toxic whatsoever. Vital signs are normal temperature 97, heart rate 84, blood pressure 122/84. Return to clinic one week or if symptoms worsen.

## 2011-09-03 ENCOUNTER — Other Ambulatory Visit (INDEPENDENT_AMBULATORY_CARE_PROVIDER_SITE_OTHER): Payer: Self-pay

## 2011-09-03 DIAGNOSIS — Z9889 Other specified postprocedural states: Secondary | ICD-10-CM

## 2011-09-05 LAB — COMPREHENSIVE METABOLIC PANEL
ALT: 119 U/L — ABNORMAL HIGH (ref 0–35)
AST: 50 U/L — ABNORMAL HIGH (ref 0–37)
CO2: 26 mEq/L (ref 19–32)
Chloride: 101 mEq/L (ref 96–112)
Sodium: 135 mEq/L (ref 135–145)
Total Bilirubin: 0.5 mg/dL (ref 0.3–1.2)
Total Protein: 7.6 g/dL (ref 6.0–8.3)

## 2011-09-06 ENCOUNTER — Encounter (INDEPENDENT_AMBULATORY_CARE_PROVIDER_SITE_OTHER): Payer: Self-pay | Admitting: Surgery

## 2011-09-06 ENCOUNTER — Ambulatory Visit (INDEPENDENT_AMBULATORY_CARE_PROVIDER_SITE_OTHER): Payer: Medicaid Other | Admitting: Surgery

## 2011-09-06 VITALS — BP 126/80 | HR 70 | Temp 97.8°F | Resp 16 | Ht 67.0 in | Wt 160.0 lb

## 2011-09-06 DIAGNOSIS — Z9889 Other specified postprocedural states: Secondary | ICD-10-CM

## 2011-09-06 NOTE — Patient Instructions (Signed)
Return 1 month

## 2011-09-06 NOTE — Progress Notes (Signed)
Patient returns after laparoscopic incisional hernia with mesh.   She's doing better. Her bowels are moving. Her recent liver function studies are back to baseline. She still having some pain around the incision.  Exam: Laparoscopic incisions well healed. Lower incision  Over hernia defect  incision healing well. No signs of recurrence or infection. Abdomen soft nontender nondistended.  Impression: Status post laparoscopic incisional hernia. Mesh  Plan: Return in one month. Increase activity slowly.

## 2011-09-14 ENCOUNTER — Other Ambulatory Visit (INDEPENDENT_AMBULATORY_CARE_PROVIDER_SITE_OTHER): Payer: Self-pay | Admitting: Surgery

## 2011-09-17 ENCOUNTER — Telehealth (INDEPENDENT_AMBULATORY_CARE_PROVIDER_SITE_OTHER): Payer: Self-pay

## 2011-09-17 DIAGNOSIS — G8918 Other acute postprocedural pain: Secondary | ICD-10-CM

## 2011-09-17 MED ORDER — TRAMADOL HCL 50 MG PO TABS: 50.0000 mg | ORAL_TABLET | Freq: Four times a day (QID) | ORAL | Status: DC | PRN

## 2011-09-17 NOTE — Telephone Encounter (Signed)
Refill authorization faxed to [facility] [phone] for Ultram 50mg , take 1 po q6hrs, #30, 0 refills signed by Dr. Luisa Hart

## 2011-10-07 ENCOUNTER — Telehealth (INDEPENDENT_AMBULATORY_CARE_PROVIDER_SITE_OTHER): Payer: Self-pay

## 2011-10-07 NOTE — Telephone Encounter (Signed)
I called patient to let her know I need to rescheduling her appointment due to [facility] cancelling office. Her phone is no longer in service. She will need to call us to reschedule.

## 2011-10-08 ENCOUNTER — Encounter (INDEPENDENT_AMBULATORY_CARE_PROVIDER_SITE_OTHER): Payer: Medicaid Other | Admitting: Surgery

## 2011-11-26 ENCOUNTER — Encounter (HOSPITAL_BASED_OUTPATIENT_CLINIC_OR_DEPARTMENT_OTHER): Payer: Self-pay | Admitting: Emergency Medicine

## 2011-11-26 ENCOUNTER — Emergency Department (HOSPITAL_BASED_OUTPATIENT_CLINIC_OR_DEPARTMENT_OTHER): Payer: Medicaid Other

## 2011-11-26 ENCOUNTER — Emergency Department (HOSPITAL_BASED_OUTPATIENT_CLINIC_OR_DEPARTMENT_OTHER)
Admission: EM | Admit: 2011-11-26 | Discharge: 2011-11-26 | Disposition: A | Discharge status: Home / Self Care | Payer: Medicaid Other | Attending: Emergency Medicine | Admitting: Emergency Medicine

## 2011-11-26 DIAGNOSIS — D649 Anemia, unspecified: Secondary | ICD-10-CM | POA: Insufficient documentation

## 2011-11-26 DIAGNOSIS — G8929 Other chronic pain: Secondary | ICD-10-CM | POA: Insufficient documentation

## 2011-11-26 DIAGNOSIS — G40802 Other epilepsy, not intractable, without status epilepticus: Secondary | ICD-10-CM | POA: Insufficient documentation

## 2011-11-26 DIAGNOSIS — I499 Cardiac arrhythmia, unspecified: Secondary | ICD-10-CM | POA: Insufficient documentation

## 2011-11-26 DIAGNOSIS — F329 Major depressive disorder, single episode, unspecified: Secondary | ICD-10-CM | POA: Insufficient documentation

## 2011-11-26 DIAGNOSIS — Z79899 Other long term (current) drug therapy: Secondary | ICD-10-CM | POA: Insufficient documentation

## 2011-11-26 DIAGNOSIS — I10 Essential (primary) hypertension: Secondary | ICD-10-CM | POA: Insufficient documentation

## 2011-11-26 DIAGNOSIS — F3289 Other specified depressive episodes: Secondary | ICD-10-CM | POA: Insufficient documentation

## 2011-11-26 DIAGNOSIS — M542 Cervicalgia: Secondary | ICD-10-CM | POA: Insufficient documentation

## 2011-11-26 HISTORY — DX: Other chronic pain: G89.29

## 2011-11-26 HISTORY — DX: Cervicalgia: M54.2

## 2011-11-26 HISTORY — DX: Dorsalgia, unspecified: M54.9

## 2011-11-26 MED ORDER — GI COCKTAIL ~~LOC~~
30.0000 mL | Freq: Once | ORAL | Status: AC
  Administered 2011-11-26: 30 mL via ORAL
  Filled 2011-11-26: qty 30

## 2011-11-26 MED ORDER — HYDROCODONE-ACETAMINOPHEN 5-325 MG PO TABS: 2.0000 | ORAL_TABLET | ORAL | Status: DC | PRN

## 2011-11-26 MED ORDER — OXYCODONE-ACETAMINOPHEN 5-325 MG PO TABS
2.0000 | ORAL_TABLET | Freq: Once | ORAL | Status: AC
  Administered 2011-11-26: 2 via ORAL
  Filled 2011-11-26 (×2): qty 2

## 2011-11-26 MED ORDER — ONDANSETRON 8 MG PO TBDP
8.0000 mg | ORAL_TABLET | Freq: Once | ORAL | Status: AC
  Administered 2011-11-26: 8 mg via ORAL
  Filled 2011-11-26: qty 1

## 2011-11-26 NOTE — ED Provider Notes (Signed)
History     CSN: [id number]  Arrival date & time 11/26/11  1100   First MD Initiated Contact with Patient 11/26/11 1213      Chief Complaint  Patient presents with  . Back Pain  . Neck Pain    (Consider location/radiation/quality/duration/timing/severity/associated sxs/prior treatment) Patient is a 41 y.o. female presenting with neck injury. The history is provided by the patient. No language interpreter was used.  Neck Injury This is a new problem. The current episode started today. The problem occurs constantly. The problem has been gradually worsening. Nothing aggravates the symptoms. She has tried nothing for the symptoms. The treatment provided moderate relief.  Pt complains of pain in her neck for years Pt complains of recent increae in pain.    Past Medical History  Diagnosis Date  . Dysrhythmia     not followed by cardiogist  . Anemia   . Blood transfusion   . Seizures   . Headache   . Depression   . Abdominal pain     takes omeprazole  . Hypertension     dr Quintella Reichert  . Chronic back pain greater than 3 months duration   . Chronic neck pain     Past Surgical History  Procedure Date  . Abdominal hysterectomy   . Cesarean section     x2  . Abdominoplasty   . Cervical spine surgery     2002  . Shoulder surgery     left humeral head replacement 2001  . Breast surgery     breast implants x 2  . Cholecystectomy 02/28/2011    Procedure: LAPAROSCOPIC CHOLECYSTECTOMY;  Surgeon: Clovis Pu. Cornett, MD;  Location: [facility];  Service: General;  Laterality: N/A;  Laparoscopic cholecystectomy.  . Ventral hernia repair 08/22/2011    Procedure: LAPAROSCOPIC VENTRAL HERNIA;  Surgeon: Clovis Pu. Cornett, MD;  Location: [facility] OR;  Service: General;  Laterality: N/A;  . Hernia repair 08/22/11    Ventral  . Back surgery     Family History  Problem Relation Age of Onset  . Cancer Mother     colon, skin  . Anesthesia problems Mother   . Heart disease Father     History    Substance Use Topics  . Smoking status: Never Smoker   . Smokeless tobacco: Never Used  . Alcohol Use: No    OB History    Grav Para Term Preterm Abortions TAB SAB Ect Mult Living                  Review of Systems  All other systems reviewed and are negative.    Allergies  Latex and Naproxen  Home Medications   Current Outpatient Rx  Name Route Sig Dispense Refill  . OXYCODONE HCL ER 10 MG PO TB12 Oral Take 1 tablet (10 mg total) by mouth 3 (three) times daily as needed for pain. 30 tablet 0  . DICYCLOMINE HCL 20 MG PO TABS Oral Take 1 tablet (20 mg total) by mouth every 6 (six) hours. For smooth bowel function 120 tablet 0  . METOCLOPRAMIDE HCL 10 MG PO TABS Oral Take 1 tablet (10 mg total) by mouth 4 (four) times daily. 20 tablet 1  . OMEPRAZOLE 40 MG PO CPDR Oral Take 1 capsule (40 mg total) by mouth 2 (two) times daily. For control of stomach acid secretion and helps GERD. 60 capsule 0    BP 141/83  Pulse 71  Temp 98.3 F (36.8 C) (Oral)  Resp 24  Ht 5\' 7"  (1.702 m)  Wt 150 lb (68.04 kg)  BMI 23.49 kg/m2  SpO2 100%  Physical Exam  Vitals reviewed. Constitutional: She is oriented to person, place, and time. She appears well-developed and well-nourished.  HENT:  Head: Normocephalic and atraumatic.  Eyes: Pupils are equal, round, and reactive to light.  Neck: Normal range of motion.  Cardiovascular: Normal rate.   Pulmonary/Chest: Effort normal.  Musculoskeletal: Normal range of motion.  Neurological: She is alert and oriented to person, place, and time. She has normal reflexes.  Skin: Skin is warm.  Psychiatric: She has a normal mood and affect.    ED Course  Procedures (including critical care time)  Labs Reviewed - No data to display Dg Cervical Spine Complete  11/26/2011  *RADIOLOGY REPORT*  Clinical Data: Back and neck pain.  CERVICAL SPINE - COMPLETE 4+ VIEW  Comparison: 06/15/2009.  Findings: Posterior fusion from C4-T3 is again noted.  No  definite hardware related to a fracture.  Anatomic alignment has been preserved.  Degenerative disc disease is noted, most pronounced at C3-C4.  Prevertebral soft tissues are normal.  IMPRESSION: 1.  Status post posterior fusion from C4-T3, without change in alignment or other acute complicating features.   Original Report Authenticated By: Florencia Reasons, M.D.      No diagnosis found.    MDM  Pt reported indigestion and pain with pain medication.   I gave pt zofran and a gi cocktail.   C spine shows fusion c4-t3.         Lonia Skinner [place], [place] 11/26/11 [phone]

## 2011-11-26 NOTE — ED Notes (Signed)
Pt. Reports she gets abd. Pain.  Pt. Reporting abd. Pain in the upper mid abd. After percocet.  Pt. Reports this always happens after pain meds given.

## 2011-11-26 NOTE — ED Provider Notes (Signed)
Medical screening examination/treatment/procedure(s) were performed by non-physician practitioner and as supervising physician I was immediately available for consultation/collaboration.   Erica Richwine W. Ellenor Wisniewski, MD 11/26/11 2255 

## 2011-11-26 NOTE — ED Notes (Signed)
Pt states she has chronic back pain and chronic neck pain for over 10 years.  Pt fell approximately one week ago and pain has worsened.  Pt states she has numbness in hands and feet.  No elimination problems.

## 2012-06-24 ENCOUNTER — Telehealth: Payer: Self-pay | Admitting: *Deleted

## 2012-07-20 NOTE — Telephone Encounter (Signed)
Mri in old system

## 2012-07-21 NOTE — Telephone Encounter (Signed)
Returned call, no answer. Left vmail.  

## 2012-11-16 ENCOUNTER — Emergency Department (HOSPITAL_COMMUNITY): Payer: Medicaid Other

## 2012-11-16 ENCOUNTER — Emergency Department (HOSPITAL_COMMUNITY)
Admission: EM | Admit: 2012-11-16 | Discharge: 2012-11-16 | Disposition: A | Discharge status: Home / Self Care | Payer: Medicaid Other | Attending: Emergency Medicine | Admitting: Emergency Medicine

## 2012-11-16 ENCOUNTER — Encounter (HOSPITAL_COMMUNITY): Payer: Self-pay | Admitting: Emergency Medicine

## 2012-11-16 DIAGNOSIS — F3289 Other specified depressive episodes: Secondary | ICD-10-CM | POA: Insufficient documentation

## 2012-11-16 DIAGNOSIS — R42 Dizziness and giddiness: Secondary | ICD-10-CM

## 2012-11-16 DIAGNOSIS — F329 Major depressive disorder, single episode, unspecified: Secondary | ICD-10-CM | POA: Insufficient documentation

## 2012-11-16 DIAGNOSIS — R51 Headache: Secondary | ICD-10-CM | POA: Insufficient documentation

## 2012-11-16 DIAGNOSIS — Z79899 Other long term (current) drug therapy: Secondary | ICD-10-CM | POA: Insufficient documentation

## 2012-11-16 DIAGNOSIS — G8929 Other chronic pain: Secondary | ICD-10-CM | POA: Insufficient documentation

## 2012-11-16 DIAGNOSIS — R079 Chest pain, unspecified: Secondary | ICD-10-CM

## 2012-11-16 DIAGNOSIS — R072 Precordial pain: Secondary | ICD-10-CM | POA: Insufficient documentation

## 2012-11-16 DIAGNOSIS — Z91040 Latex allergy status: Secondary | ICD-10-CM | POA: Insufficient documentation

## 2012-11-16 DIAGNOSIS — Z862 Personal history of diseases of the blood and blood-forming organs and certain disorders involving the immune mechanism: Secondary | ICD-10-CM | POA: Insufficient documentation

## 2012-11-16 DIAGNOSIS — R209 Unspecified disturbances of skin sensation: Secondary | ICD-10-CM | POA: Insufficient documentation

## 2012-11-16 DIAGNOSIS — I10 Essential (primary) hypertension: Secondary | ICD-10-CM | POA: Insufficient documentation

## 2012-11-16 DIAGNOSIS — R202 Paresthesia of skin: Secondary | ICD-10-CM

## 2012-11-16 LAB — BASIC METABOLIC PANEL
BUN: 17 mg/dL (ref 6–23)
CO2: 23 mEq/L (ref 19–32)
Calcium: 8.7 mg/dL (ref 8.4–10.5)
Glucose, Bld: 98 mg/dL (ref 70–99)
Sodium: 137 mEq/L (ref 135–145)

## 2012-11-16 LAB — URINALYSIS, ROUTINE W REFLEX MICROSCOPIC
Glucose, UA: NEGATIVE mg/dL
Hgb urine dipstick: NEGATIVE
Protein, ur: NEGATIVE mg/dL
Specific Gravity, Urine: 1.017 (ref 1.005–1.030)

## 2012-11-16 LAB — POCT I-STAT TROPONIN I: Troponin i, poc: 0 ng/mL (ref 0.00–0.08)

## 2012-11-16 LAB — CBC
HCT: 38.9 % (ref 36.0–46.0)
Hemoglobin: 13 g/dL (ref 12.0–15.0)
MCH: 32.6 pg (ref 26.0–34.0)
RBC: 3.99 MIL/uL (ref 3.87–5.11)

## 2012-11-16 MED ORDER — METOCLOPRAMIDE HCL 5 MG/ML IJ SOLN
10.0000 mg | Freq: Once | INTRAMUSCULAR | Status: AC
  Administered 2012-11-16: 10 mg via INTRAVENOUS
  Filled 2012-11-16: qty 2

## 2012-11-16 MED ORDER — DIPHENHYDRAMINE HCL 50 MG/ML IJ SOLN
25.0000 mg | Freq: Once | INTRAMUSCULAR | Status: AC
  Administered 2012-11-16: 25 mg via INTRAVENOUS
  Filled 2012-11-16: qty 1

## 2012-11-16 MED ORDER — POTASSIUM CHLORIDE CRYS ER 20 MEQ PO TBCR
40.0000 meq | EXTENDED_RELEASE_TABLET | Freq: Once | ORAL | Status: AC
  Administered 2012-11-16: 40 meq via ORAL
  Filled 2012-11-16: qty 2

## 2012-11-16 MED ORDER — SODIUM CHLORIDE 0.9 % IV BOLUS (SEPSIS)
1000.0000 mL | Freq: Once | INTRAVENOUS | Status: AC
  Administered 2012-11-16: 1000 mL via INTRAVENOUS

## 2012-11-16 NOTE — ED Notes (Signed)
Pt presents with multiple complaints, reports mid-sternum chest pain radiating straight through to her back, all over body aches, numbness to bilateral feet, hands, eyes, and dizziness that started on Saturday

## 2012-11-16 NOTE — ED Notes (Addendum)
Pt place on cardiac and SpO2 monitor

## 2012-11-16 NOTE — ED Provider Notes (Signed)
CSN: [id number]     Arrival date & time 11/16/12  1446 History   First MD Initiated Contact with Patient 11/16/12 1519     Chief Complaint  Patient presents with  . Numbness  . Dizziness  . Chest Pain   (Consider location/radiation/quality/duration/timing/severity/associated sxs/prior Treatment) The history is provided by the patient and medical records.   This is a 42 y.o. Female with PMH significant for dysrhythmia, anemia, seizures, headache, depression, HTN, chronic neck and back pain, presenting to the ED for multiple complaints.  1-- chest pain.  Intermittent for the past week, mid-sternal in origin with radiation to her back.  No associated SOB, palpitations, diaphoresis, or weakness.  States when she has the sensations of pain, she has a burning sensation along her posterior neck, but no radiation of pain to her neck.  Hx of dysrhythmia, not currently followed by cardiology.  No personal hx of CAD or MI.  Significant family hx of CAD and MI.  No recent travel, surgeries, or LE edema.  No chest pain on arrival.  2-- dizziness.  Intermittent for the past week, described as an "off balance" sensation.  Associated with frontal headache and mildly blurred vision.  No hx of migraines.  No weakness.  No difficulty walking or syncopal episodes.  No recent head trauma.  No photophobia, aura, tinnitus, or changes in speech.    3-- numbness.  States intermittently present in her fingertips and feet bilaterally.  States "i can feel my hands and feet, but they feel funny."  Notes it is somewhat of a "tingling" sensation.  Pt also complains of intermittent "cramps" in her arms and legs.  No prior hx of TIA or stroke.  No hx of DVT.  Past Medical History  Diagnosis Date  . Dysrhythmia     not followed by cardiogist  . Anemia   . Blood transfusion   . Seizures   . Headache(784.0)   . Depression   . Abdominal pain     takes omeprazole  . Hypertension     dr Quintella Reichert  . Chronic back pain  greater than 3 months duration   . Chronic neck pain    Past Surgical History  Procedure Laterality Date  . Abdominal hysterectomy    . Cesarean section      x2  . Abdominoplasty    . Cervical spine surgery      2002  . Shoulder surgery      left humeral head replacement 2001  . Breast surgery      breast implants x 2  . Cholecystectomy  02/28/2011    Procedure: LAPAROSCOPIC CHOLECYSTECTOMY;  Surgeon: Clovis Pu. Cornett, MD;  Location: [facility];  Service: General;  Laterality: N/A;  Laparoscopic cholecystectomy.  . Ventral hernia repair  08/22/2011    Procedure: LAPAROSCOPIC VENTRAL HERNIA;  Surgeon: Clovis Pu. Cornett, MD;  Location: [facility];  Service: General;  Laterality: N/A;  . Hernia repair  08/22/11    Ventral  . Back surgery     Family History  Problem Relation Age of Onset  . Cancer Mother     colon, skin  . Anesthesia problems Mother   . Heart disease Father    History  Substance Use Topics  . Smoking status: Never Smoker   . Smokeless tobacco: Never Used  . Alcohol Use: No   OB History   Grav Para Term Preterm Abortions TAB SAB Ect Mult Living  Review of Systems  Cardiovascular: Positive for chest pain.  Neurological: Positive for light-headedness and headaches.  All other systems reviewed and are negative.    Allergies  Latex and Naproxen  Home Medications   Current Outpatient Rx  Name  Route  Sig  Dispense  Refill  . dicyclomine (BENTYL) 20 MG tablet   Oral   Take 1 tablet (20 mg total) by mouth every 6 (six) hours. For smooth bowel function   120 tablet   0   . omeprazole (PRILOSEC) 40 MG capsule   Oral   Take 1 capsule (40 mg total) by mouth 2 (two) times daily. For control of stomach acid secretion and helps GERD.   60 capsule   0   . sertraline (ZOLOFT) 50 MG tablet   Oral   Take 50 mg by mouth daily.         Marland Kitchen topiramate (TOPAMAX) 25 MG tablet   Oral   Take 25 mg by mouth 2 (two) times daily.          BP 109/69   Pulse 87  Temp(Src) 98.1 F (36.7 C) (Oral)  Resp 20  Ht 5\' 9"  (1.753 m)  Wt 265 lb 4.8 oz (120.339 kg)  BMI 39.16 kg/m2  SpO2 98%  Physical Exam  Nursing note and vitals reviewed. Constitutional: She is oriented to person, place, and time. She appears well-developed and well-nourished. No distress.  HENT:  Head: Normocephalic and atraumatic.  Mouth/Throat: Oropharynx is clear and moist.  No meningeal signs  Eyes: Conjunctivae and EOM are normal. Pupils are equal, round, and reactive to light.  Neck: Normal range of motion.  Cardiovascular: Normal rate, regular rhythm and normal heart sounds.   Pulmonary/Chest: Effort normal and breath sounds normal. No respiratory distress. She has no wheezes.  Abdominal: Soft. Bowel sounds are normal. There is no tenderness. There is no guarding.  Musculoskeletal: Normal range of motion.  No calf pain, asymmetry, or palpable cord; negative Homan's sign  Neurological: She is alert and oriented to person, place, and time. She has normal strength. She displays no tremor. No cranial nerve deficit or sensory deficit. She displays no seizure activity. Gait normal.  CN grossly intact, moves all extremities appropriately without ataxia, equal strength UE and LE bilaterally; sensation to light touch intact diffusely; no focal neuro deficits or facial droop appreciated; ambulating unassisted with non-ataxic gait  Skin: Skin is warm and dry. She is not diaphoretic.  Psychiatric: She has a normal mood and affect.    ED Course  Procedures (including critical care time)   Date: 11/16/2012  Rate: 89  Rhythm: normal sinus rhythm  QRS Axis: normal  Intervals: normal  ST/T Wave abnormalities: normal  Conduction Disutrbances:none  Narrative Interpretation:   Old EKG Reviewed: unchanged   Labs Review Labs Reviewed  BASIC METABOLIC PANEL - Abnormal; Notable for the following:    Potassium 3.1 (*)    GFR calc non Af Amer 88 (*)    All other components  within normal limits  CBC  URINALYSIS, ROUTINE W REFLEX MICROSCOPIC  POCT I-STAT TROPONIN I   Imaging Review Ct Head Wo Contrast  11/16/2012   CLINICAL DATA:  Headache. Numbness.  EXAM: CT HEAD WITHOUT CONTRAST  TECHNIQUE: Contiguous axial images were obtained from the base of the skull through the vertex without intravenous contrast.  COMPARISON:  10/24/2010  FINDINGS: The brainstem, cerebellum, cerebral peduncles, thalamus, basal ganglia, basilar cisterns, and ventricular system appear within normal limits. No intracranial hemorrhage,  mass lesion, or acute CVA.  IMPRESSION: No significant abnormality identified.   Electronically Signed   By: Herbie Baltimore M.D.   On: 11/16/2012 16:05    EKG Interpretation   None       MDM   1. Chest pain   2. Headache   3. Episodic lightheadedness   4. Paresthesias     EKG NSR, no acute ischemic changes, unchanged from previous.  Trop negative.  Labs as above, mild hypokalemia at 3.1, replaced in the ED.  CT head negative.  Pt has ambulated around the ED unassisted without difficulty or signs of ataxic gait.  Headache resolved with reglan and benadryl.  Repeat neurological exam remains without noted deficits.  Pts described chest pain is atypical, but not present in the ED today.  I doubt ACS, PE, dissection, or other vascular collapse at this time.  Pt will FU with PCP for re-check this week.  Given referral to neurology should other sx continue.  Discussed plan with pt, she agreed.  Return precautions advised.  VS stable at time of d/c.  Discussed with Dr. Rhunette Croft who agrees with assessment and plan.  Garlon Hatchet, PA-C 11/16/12 1902  Garlon Hatchet, PA-C 11/16/12 [phone]

## 2012-11-17 NOTE — ED Provider Notes (Signed)
Medical screening examination/treatment/procedure(s) were performed by non-physician practitioner and as supervising physician I was immediately available for consultation/collaboration.  Derwood Kaplan, MD 11/17/12 1555

## 2012-11-27 ENCOUNTER — Ambulatory Visit (INDEPENDENT_AMBULATORY_CARE_PROVIDER_SITE_OTHER): Payer: Medicaid Other | Admitting: Neurology

## 2012-11-27 ENCOUNTER — Encounter: Payer: Self-pay | Admitting: Neurology

## 2012-11-27 VITALS — BP 124/80 | HR 80 | Temp 98.3°F | Ht 69.0 in | Wt 179.0 lb

## 2012-11-27 DIAGNOSIS — R209 Unspecified disturbances of skin sensation: Secondary | ICD-10-CM

## 2012-11-27 DIAGNOSIS — R200 Anesthesia of skin: Secondary | ICD-10-CM

## 2012-11-27 NOTE — Patient Instructions (Addendum)
If there is something neurological happening, it would likely be involving your nerves. 1.  We will check blood work for common causes of nerve problems. 2.  We will order a nerve conduction study to test for any nerve damage. 3.  We will get an MRI of the lower back to look for a cause of numbness around your anus. 4.  Follow up with me after the tests are completed and we can go from there.   Your MRI is scheduled at [facility] on Tuesday, November 4th at 3:00 pm.  Please check in at the first floor radiology department 15 minutes prior to your scheduled appointment time. Enter the hospital off of [address] at [address].     [phone].  You should be fasting for your lab work.
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
## 2012-12-01 ENCOUNTER — Ambulatory Visit (HOSPITAL_COMMUNITY): Admission: RE | Admit: 2012-12-01 | Payer: Medicaid Other | Source: Ambulatory Visit

## 2012-12-04 ENCOUNTER — Telehealth: Payer: Self-pay | Admitting: Neurology

## 2012-12-04 NOTE — Telephone Encounter (Signed)
Spoke with the patient. Medicaid denied the MRI lumbar spine that Dr. Everlena Cooper had recommended so procedure was cancelled. Pt reminded to have the NCV which is scheduled for 11/17. She states she will.

## 2012-12-07 ENCOUNTER — Ambulatory Visit (HOSPITAL_COMMUNITY): Admission: RE | Admit: 2012-12-07 | Payer: Medicaid Other | Source: Ambulatory Visit

## 2012-12-14 ENCOUNTER — Other Ambulatory Visit: Payer: Self-pay | Admitting: Neurology

## 2012-12-14 ENCOUNTER — Encounter: Payer: Self-pay | Admitting: Neurology

## 2012-12-14 ENCOUNTER — Ambulatory Visit (INDEPENDENT_AMBULATORY_CARE_PROVIDER_SITE_OTHER): Payer: Medicaid Other | Admitting: Neurology

## 2012-12-14 DIAGNOSIS — R209 Unspecified disturbances of skin sensation: Secondary | ICD-10-CM

## 2012-12-14 LAB — GLUCOSE, RANDOM: Glucose, Bld: 87 mg/dL (ref 70–99)

## 2012-12-14 LAB — C-REACTIVE PROTEIN: CRP: <0.5 mg/dL (ref <0.60)

## 2012-12-14 LAB — VITAMIN B12: Vitamin B-12: 595 pg/mL (ref 211–911)

## 2012-12-14 LAB — RHEUMATOID FACTOR: Rhuematoid fact SerPl-aCnc: <10 IU/mL (ref <=14)

## 2012-12-14 NOTE — Procedures (Signed)
[facility]  [address] [place], [address]  [place], [place] [address] Tel: [phone] Fax:  [phone] Test Date:  12/14/2012  Patient: [name] DOB: 1971/01/04 Physician: Nita Sickle, DO  Sex: Female Height: 5\' 9"  Ref Phys: Shon Millet  ID#: [id number] Temp: 33.4 Technician:    Patient Complaints: This is a 42 year-old female presenting with paresthesias of her arms and legs, worse on the left side.  NCV & EMG Findings: Extensive evaluation of the left upper and lower extremity and additional studies of the right reveals: 1. Normal median, ulnar, radial, sural, and superficial peroneal motor responses. 2. Proximal median motor response was increased (10.6 mV) while the motor amplitude at the wrist was reduced (4.8 mV).  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  The ulnar motor response recorded at the abductor digiti minimi is normal. 3. Normal peroneal motor response.   4. The tibial motor response is reduced bilaterally recording at the abductor hallucis.  5. Needle electrode examination shows chronic motor axon loss changes isolated to the intrinsic foot muscles which is a nonspecific finding and may be due local trauma from shoe wearing, compression, etc.    Impression: There is no evidence of a large fiber generalized sensorimotor polyneuropathy or cervical/lumbar motor radiculopathy affecting the left side.   Incidentally, there is a left Martin-Gruber anastomosis which is a normal variant.   ___________________________ Nita Sickle, DO    Nerve Conduction Studies Anti Sensory Summary Table   Site NR Peak (ms) Norm Peak (ms) P-T Amp (V) Norm P-T Amp  Left Median Anti Sensory (2nd Digit)  Wrist    3.1 <3.4 79.5 >20  Left Radial Anti Sensory (Base 1st Digit)  Wrist    2.0 <2.7 51.2 >18  Left Sup Peroneal Anti Sensory (Ant Lat Mall)  12  cm    2.5 <4.5 10.8 >5  Left Sural Anti Sensory (Lat Mall)  Calf    2.9 <4.5 19.4 >5  Left Ulnar Anti Sensory (5th Digit)  Wrist    3.0 <3.1 59.6 >12   Motor Summary Table   Site NR Onset (ms) Norm Onset (ms) O-P Amp (mV) Norm O-P Amp Site1 Site2 Delta-0 (ms) Dist (cm) Vel (m/s) Norm Vel (m/s)  Left Median Motor (Abd Poll Brev)  Wrist    2.3 <3.9 4.8 >6 Elbow Wrist 6.6 0.0  >50  Elbow    8.9  10.6  Ulnar wrist cross-over Elbow 5.4 0.0    Ulnar wrist cross-over    3.5  5.9         Left Peroneal Motor (Ext Dig Brev)  Ankle    4.5 <5.5 4.1 >3 B Fib Ankle 7.5 36.0 48 >40  B Fib    12.0  3.6  Poplt B Fib 1.6 10.0 63 >40  Poplt    13.6  3.2         Left Tibial Motor (Abd Hall Brev)  Ankle    4.2 <6.0 7.2 >8 Knee Ankle 9.6 0.0  >40  Knee    13.8  3.9         Right Tibial Motor (Abd Hall Brev)  Ankle    4.4 <6.0 7.7 >8 Knee Ankle 8.4 41.0 49 >40  Knee    12.8  6.1         Left Ulnar Motor (Abd Dig Minimi)  Wrist    2.1 <3.1  12.9 >7 B Elbow Wrist 3.5 0.0  >50  B Elbow    5.6  10.9  A Elbow B Elbow 1.7 0.0  >50  A Elbow    7.3  10.9          F Wave Studies   NR F-Lat (ms) Lat Norm (ms) L-R F-Lat (ms)  Left Tibial (Mrkrs) (Abd Hallucis)     50.67 <55    H Reflex Studies   NR H-Lat (ms) Lat Norm (ms) L-R H-Lat (ms)  Left Tibial (Gastroc)     33.78 <35 0.89  Right Tibial (Gastroc)     34.67 <35 0.89   EMG   Side Muscle Ins Act Fibs Psw Fasc Number Recrt Dur Dur. Amp Amp. Poly Poly. Comment  Left 1stDorInt Nml Nml Nml Nml Nml Nml Nml Nml Nml Nml Nml Nml N/A  Left Ext Indicis Nml Nml Nml Nml Nml Nml Nml Nml Nml Nml Nml Nml N/A  Left PronatorTeres Nml Nml Nml Nml Nml Nml Nml Nml Nml Nml Nml Nml N/A  Left Biceps Nml Nml Nml Nml Nml Nml Nml Nml Nml Nml Nml Nml N/A  Left Triceps Nml Nml Nml Nml Nml Nml Nml Nml Nml Nml Nml Nml N/A  Left Deltoid Nml Nml Nml Nml Nml Nml Nml Nml Nml Nml Nml Nml N/A  Left AntTibialis Nml Nml Nml Nml Nml Nml Nml Nml Nml Nml Nml Nml N/A  Left Gastroc Nml  Nml Nml Nml Nml Nml Nml Nml Nml Nml Nml Nml N/A  Left Ext Dig Brev Nml Nml Nml Nml 1- Mod-R Some 1+ Nml Nml Nml Nml N/A  Left AbdHallucis Nml Nml Nml Nml 1- Mod-R Some 1+ Nml Nml Nml Nml N/A  Left RectFemoris Nml Nml Nml Nml Nml Nml Nml Nml Nml Nml Nml Nml N/A  Left BicepsFemS Nml Nml Nml Nml Nml Nml Nml Nml Nml Nml Nml Nml N/A  Left Semimembranosus Nml Nml Nml Nml Nml Nml Nml Nml Nml Nml Nml Nml N/A      Waveforms:        `

## 2012-12-14 NOTE — Progress Notes (Signed)
See procedure note under "Notes" for EMG results.  Bobi Daudelin K. Sidrah Harden, DO  

## 2012-12-15 LAB — ANA: Anti Nuclear Antibody(ANA): NEGATIVE

## 2012-12-16 LAB — PROTEIN ELECTROPHORESIS, SERUM
Albumin ELP: 58.4 % (ref 55.8–66.1)
Beta 2: 5.3 % (ref 3.2–6.5)
Beta Globulin: 6.1 % (ref 4.7–7.2)
Total Protein, Serum Electrophoresis: 7.4 g/dL (ref 6.0–8.3)

## 2012-12-16 LAB — IMMUNOFIXATION ELECTROPHORESIS
IgA: 291 mg/dL (ref 69–380)
IgM, Serum: 92 mg/dL (ref 52–322)
Total Protein, Serum Electrophoresis: 7.4 g/dL (ref 6.0–8.3)

## 2012-12-22 LAB — VITAMIN B6: Vitamin B6: 3.7 ng/mL (ref 2.1–21.7)

## 2012-12-28 ENCOUNTER — Encounter: Payer: Self-pay | Admitting: Neurology

## 2012-12-28 ENCOUNTER — Ambulatory Visit (INDEPENDENT_AMBULATORY_CARE_PROVIDER_SITE_OTHER): Payer: Medicaid Other | Admitting: Neurology

## 2012-12-28 VITALS — BP 100/68 | HR 80 | Temp 98.3°F | Ht 69.0 in | Wt 182.0 lb

## 2012-12-28 DIAGNOSIS — IMO0001 Reserved for inherently not codable concepts without codable children: Secondary | ICD-10-CM

## 2012-12-28 DIAGNOSIS — M797 Fibromyalgia: Secondary | ICD-10-CM

## 2012-12-28 MED ORDER — AMITRIPTYLINE HCL 50 MG PO TABS: 50.0000 mg | ORAL_TABLET | Freq: Every day | ORAL | Status: DC

## 2012-12-28 NOTE — Patient Instructions (Signed)
There does not seem to be any evidence of nerve damage on the EMG test or the blood work.  These symptoms are likely related to fibromyalgia.  Treatment should be focused on pain management. 1.  Increase the amitriptyline to 50mg  at bedtime.  Continue taking two of the 25mg  pills at bedtime until you are finished.  Then, you can pick up a refill for 50mg  tablets, which you can take one tablet daily at bedtime.  Call in 4 weeks with update. 2.  Follow up in 3 months.
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
## 2012-12-30 ENCOUNTER — Emergency Department (HOSPITAL_COMMUNITY): Payer: Medicaid Other

## 2012-12-30 ENCOUNTER — Emergency Department (HOSPITAL_COMMUNITY)
Admission: EM | Admit: 2012-12-30 | Discharge: 2012-12-30 | Disposition: A | Discharge status: Home / Self Care | Payer: Medicaid Other | Attending: Emergency Medicine | Admitting: Emergency Medicine

## 2012-12-30 ENCOUNTER — Encounter (HOSPITAL_COMMUNITY): Payer: Self-pay | Admitting: Emergency Medicine

## 2012-12-30 DIAGNOSIS — M549 Dorsalgia, unspecified: Secondary | ICD-10-CM | POA: Insufficient documentation

## 2012-12-30 DIAGNOSIS — R079 Chest pain, unspecified: Secondary | ICD-10-CM

## 2012-12-30 DIAGNOSIS — Z8659 Personal history of other mental and behavioral disorders: Secondary | ICD-10-CM | POA: Insufficient documentation

## 2012-12-30 DIAGNOSIS — R0602 Shortness of breath: Secondary | ICD-10-CM | POA: Insufficient documentation

## 2012-12-30 DIAGNOSIS — M542 Cervicalgia: Secondary | ICD-10-CM | POA: Insufficient documentation

## 2012-12-30 DIAGNOSIS — I10 Essential (primary) hypertension: Secondary | ICD-10-CM | POA: Insufficient documentation

## 2012-12-30 DIAGNOSIS — Z862 Personal history of diseases of the blood and blood-forming organs and certain disorders involving the immune mechanism: Secondary | ICD-10-CM | POA: Insufficient documentation

## 2012-12-30 DIAGNOSIS — Z79899 Other long term (current) drug therapy: Secondary | ICD-10-CM | POA: Insufficient documentation

## 2012-12-30 DIAGNOSIS — G8929 Other chronic pain: Secondary | ICD-10-CM | POA: Insufficient documentation

## 2012-12-30 DIAGNOSIS — Z91040 Latex allergy status: Secondary | ICD-10-CM | POA: Insufficient documentation

## 2012-12-30 DIAGNOSIS — Z8669 Personal history of other diseases of the nervous system and sense organs: Secondary | ICD-10-CM | POA: Insufficient documentation

## 2012-12-30 MED ORDER — ONDANSETRON HCL 4 MG/2ML IJ SOLN
4.0000 mg | Freq: Once | INTRAMUSCULAR | Status: AC
  Administered 2012-12-30: 4 mg via INTRAVENOUS
  Filled 2012-12-30: qty 2

## 2012-12-30 MED ORDER — SODIUM CHLORIDE 0.9 % IV BOLUS (SEPSIS)
500.0000 mL | Freq: Once | INTRAVENOUS | Status: AC
  Administered 2012-12-30: 500 mL via INTRAVENOUS

## 2012-12-30 MED ORDER — IOHEXOL 350 MG/ML SOLN
80.0000 mL | Freq: Once | INTRAVENOUS | Status: AC | PRN
  Administered 2012-12-30: 80 mL via INTRAVENOUS

## 2012-12-30 MED ORDER — MORPHINE SULFATE 4 MG/ML IJ SOLN
4.0000 mg | Freq: Once | INTRAMUSCULAR | Status: AC
  Administered 2012-12-30: 4 mg via INTRAVENOUS
  Filled 2012-12-30: qty 1

## 2012-12-30 MED ORDER — OXYCODONE-ACETAMINOPHEN 5-325 MG PO TABS: 1.0000 | ORAL_TABLET | Freq: Four times a day (QID) | ORAL | Status: DC | PRN

## 2012-12-30 NOTE — ED Provider Notes (Signed)
CSN: [id number]     Arrival date & time 12/30/12  1310 History   First MD Initiated Contact with Patient 12/30/12 1532     Chief Complaint  Patient presents with  . Arm Pain   (Consider location/radiation/quality/duration/timing/severity/associated sxs/prior Treatment) Patient is a 42 y.o. female presenting with arm pain. The history is provided by the patient.  Arm Pain Associated symptoms include chest pain and shortness of breath. Pertinent negatives include no abdominal pain and no headaches.   patient presents with right-sided chest pain. She's had for 2 weeks. She states she had a cough and began. He is on the right side of her chest under her arm. It is worse if he lays on his side. It is worse if she takes a breath. She states nothing makes it better or worse. No fevers. No rash. No trauma. She has not had episodes like this before. She does not smoke. She states she feels shortness of breath also.  Past Medical History  Diagnosis Date  . Dysrhythmia     not followed by cardiogist  . Anemia   . Blood transfusion   . Seizures   . Headache(784.0)   . Depression   . Abdominal pain     takes omeprazole  . Hypertension     dr Quintella Reichert  . Chronic back pain greater than 3 months duration   . Chronic neck pain    Past Surgical History  Procedure Laterality Date  . Abdominal hysterectomy    . Cesarean section      x2  . Abdominoplasty    . Cervical spine surgery      2002  . Shoulder surgery      left humeral head replacement 2001  . Breast surgery      breast implants x 2  . Cholecystectomy  02/28/2011    Procedure: LAPAROSCOPIC CHOLECYSTECTOMY;  Surgeon: Clovis Pu. Cornett, MD;  Location: [facility] OR;  Service: General;  Laterality: N/A;  Laparoscopic cholecystectomy.  . Ventral hernia repair  08/22/2011    Procedure: LAPAROSCOPIC VENTRAL HERNIA;  Surgeon: Clovis Pu. Cornett, MD;  Location: [facility] OR;  Service: General;  Laterality: N/A;  . Hernia repair  08/22/11    Ventral  . Back  surgery     Family History  Problem Relation Age of Onset  . Cancer Mother     colon, skin  . Anesthesia problems Mother   . Heart disease Father    History  Substance Use Topics  . Smoking status: Never Smoker   . Smokeless tobacco: Never Used  . Alcohol Use: No   OB History   Grav Para Term Preterm Abortions TAB SAB Ect Mult Living                 Review of Systems  Constitutional: Negative for activity change and appetite change.  Eyes: Negative for pain.  Respiratory: Positive for shortness of breath. Negative for chest tightness.   Cardiovascular: Positive for chest pain. Negative for leg swelling.  Gastrointestinal: Negative for nausea, vomiting, abdominal pain and diarrhea.  Genitourinary: Negative for flank pain.  Musculoskeletal: Negative for back pain and neck stiffness.  Skin: Negative for rash.  Neurological: Negative for weakness, numbness and headaches.  Psychiatric/Behavioral: Negative for behavioral problems.    Allergies  Latex and Naproxen  Home Medications   Current Outpatient Rx  Name  Route  Sig  Dispense  Refill  . amitriptyline (ELAVIL) 50 MG tablet   Oral   Take 1 tablet (50  mg total) by mouth at bedtime.   30 tablet   3   . dicyclomine (BENTYL) 20 MG tablet   Oral   Take 1 tablet (20 mg total) by mouth every 6 (six) hours. For smooth bowel function   120 tablet   0   . lisinopril-hydrochlorothiazide (PRINZIDE,ZESTORETIC) 20-25 MG per tablet   Oral   Take 1 tablet by mouth daily.         Marland Kitchen omeprazole (PRILOSEC) 40 MG capsule   Oral   Take 1 capsule (40 mg total) by mouth 2 (two) times daily. For control of stomach acid secretion and helps GERD.   60 capsule   0   . oxyCODONE-acetaminophen (PERCOCET/ROXICET) 5-325 MG per tablet   Oral   Take 1-2 tablets by mouth every 6 (six) hours as needed for severe pain.   10 tablet   0    BP 116/79  Pulse 75  Temp(Src) 97.8 F (36.6 C) (Oral)  Resp 24  Ht 5\' 7"  (1.702 m)  Wt 186  lb 4.8 oz (84.505 kg)  BMI 29.17 kg/m2  SpO2 100% Physical Exam  Nursing note and vitals reviewed. Constitutional: She is oriented to person, place, and time. She appears well-developed and well-nourished.  HENT:  Head: Normocephalic and atraumatic.  Eyes: EOM are normal. Pupils are equal, round, and reactive to light.  Neck: Normal range of motion. Neck supple.  Cardiovascular: Normal rate, regular rhythm and normal heart sounds.   No murmur heard. Pulmonary/Chest: Effort normal and breath sounds normal. No respiratory distress. She has no wheezes. She has no rales. She exhibits tenderness.  Tenderness to right lateral chest wall and axillary area. No mass. No rash. No crepitance or deformity.  Abdominal: Soft. Bowel sounds are normal. She exhibits no distension. There is no tenderness. There is no rebound and no guarding.  Musculoskeletal: Normal range of motion.  Neurological: She is alert and oriented to person, place, and time. No cranial nerve deficit.  Skin: Skin is warm and dry.  Psychiatric: She has a normal mood and affect. Her speech is normal.    ED Course  Procedures (including critical care time) Labs Review Labs Reviewed - No data to display Imaging Review Dg Ribs Unilateral W/chest Right  12/30/2012   CLINICAL DATA:  Pain  EXAM: RIGHT RIBS AND CHEST - 3+ VIEW  COMPARISON:  Chest radiograph February 22, 2011  FINDINGS: Frontal chest as well as bilateral oblique and cone-down lower rib images were obtained. There is patchy bibasilar lung atelectasis. Elsewhere lungs are clear. Heart size and pulmonary vascularity are normal. There is postoperative change in the lower cervical and upper thoracic regions.  No pneumothorax or effusion. No demonstrable acute rib fracture. There is evidence of old healed rib trauma on the left.  IMPRESSION: Bibasilar atelectatic change.  No demonstrable acute rib fracture.   Electronically Signed   By: Bretta Bang M.D.   On: 12/30/2012 14:42    Ct Angio Chest W/cm &/or Wo Cm  12/30/2012   CLINICAL DATA:  Left-sided pleuritic chest pain.  Cough.  EXAM: CT ANGIOGRAPHY CHEST WITH CONTRAST  TECHNIQUE: Multidetector CT imaging of the chest was performed using the standard protocol during bolus administration of intravenous contrast. Multiplanar CT image reconstructions including MIPs were obtained to evaluate the vascular anatomy.  CONTRAST:  80mL OMNIPAQUE IOHEXOL 350 MG/ML SOLN  COMPARISON:  CT abdomen 06/02/2011  FINDINGS: Lungs are adequately inflated with mild heterogeneous opacification over the posterior lung bases likely atelectasis although  cannot exclude infection. There is a faint 5 mm nodule adjacent the minor fissure. There is mild cardiomegaly. There is no evidence of pulmonary embolism. Remaining mediastinal structures are within normal. There is no evidence of adenopathy. Bilateral breast implants are present.  Images through the upper abdomen demonstrate evidence of a prior cholecystectomy. There is mild diverticulosis of the colon. Spinal fusion hardware is present over the upper thoracic spine.  Review of the MIP images confirms the above findings.  IMPRESSION: No evidence of pulmonary embolism.  Mild heterogeneous opacification over the posterior lung bases suggesting atelectasis although cannot exclude infection.  Faint 5 mm nodule adjacent the minor fissure. If the patient is at high risk for bronchogenic carcinoma, follow-up chest CT at 6-12 months is recommended. If the patient is at low risk for bronchogenic carcinoma, follow-up chest CT at 12 months is recommended. This recommendation follows the consensus statement: Guidelines for Management of Small Pulmonary Nodules Detected on CT Scans: A Statement from the Fleischner Society as published in Radiology 2005;237:395-400.  Mild cardiomegaly.   Electronically Signed   By: Elberta Fortis M.D.   On: 12/30/2012 17:31    EKG Interpretation    Date/Time:  Wednesday December 30 2012  13:14:43 EST Ventricular Rate:  96 PR Interval:  144 QRS Duration: 74 QT Interval:  346 QTC Calculation: 437 R Axis:   33 Text Interpretation:  Normal sinus rhythm Normal ECG Confirmed by Atziri Zubiate  MD, Veleka Djordjevic ([id number]) on 12/31/2012 11:24:52 PM            MDM   1. Chest pain    Patient with right-sided chest pain. EKG reassuring. X-ray reassuring. CT done and does not show clear cause. Will discharge home.    Juliet Rude. Rubin Payor, MD 12/31/12 2325

## 2012-12-30 NOTE — ED Notes (Addendum)
Rt arm and side pain x 2 weeks ago got worse 3 nights has had a cough no fever denies any injury hurts to left arm  hurts to take a deep breath stabbing pain

## 2013-01-15 ENCOUNTER — Telehealth: Payer: Self-pay | Admitting: Neurology

## 2013-01-15 ENCOUNTER — Other Ambulatory Visit: Payer: Self-pay | Admitting: Neurology

## 2013-01-15 MED ORDER — GABAPENTIN 300 MG PO CAPS: ORAL_CAPSULE | ORAL | Status: DC

## 2013-01-15 NOTE — Telephone Encounter (Signed)
We can start gabapentin.  She can take 300mg  at bedtime for one week, then increase to 300mg  BID for one week, then increase to 300mg  TID.  She can stop the amitriptyline.

## 2013-01-15 NOTE — Telephone Encounter (Signed)
Spoke with the patient. Information given as per Dr. Everlena Cooper below. Will send script to the [facility] on [address] as requested. Asked that she call up in a few weeks with an update. She states she will.

## 2013-01-15 NOTE — Telephone Encounter (Signed)
Patient left a message on my vm at 3:39 on 01/14/13 stating the Amitriptyline 50 mg is not working; still in a lot of pain and unable to sleep at night because of the pain; the medication just makes her want to then just sleep all day. She is asking to try something else. **Dr. Everlena Cooper, please advise. Office note mentions Gabapentin or Lyrica (but may be too costly).

## 2013-01-15 NOTE — Telephone Encounter (Signed)
Left a message for the patient to return my call.  

## 2013-02-16 ENCOUNTER — Encounter (HOSPITAL_COMMUNITY): Payer: Self-pay | Admitting: Emergency Medicine

## 2013-02-16 ENCOUNTER — Emergency Department (HOSPITAL_COMMUNITY)
Admission: EM | Admit: 2013-02-16 | Discharge: 2013-02-16 | Disposition: A | Discharge status: Home / Self Care | Payer: Medicaid Other | Attending: Emergency Medicine | Admitting: Emergency Medicine

## 2013-02-16 DIAGNOSIS — F3289 Other specified depressive episodes: Secondary | ICD-10-CM | POA: Insufficient documentation

## 2013-02-16 DIAGNOSIS — F410 Panic disorder [episodic paroxysmal anxiety] without agoraphobia: Secondary | ICD-10-CM | POA: Insufficient documentation

## 2013-02-16 DIAGNOSIS — M549 Dorsalgia, unspecified: Secondary | ICD-10-CM | POA: Insufficient documentation

## 2013-02-16 DIAGNOSIS — D649 Anemia, unspecified: Secondary | ICD-10-CM | POA: Insufficient documentation

## 2013-02-16 DIAGNOSIS — M542 Cervicalgia: Secondary | ICD-10-CM | POA: Insufficient documentation

## 2013-02-16 DIAGNOSIS — R51 Headache: Secondary | ICD-10-CM | POA: Insufficient documentation

## 2013-02-16 DIAGNOSIS — I10 Essential (primary) hypertension: Secondary | ICD-10-CM | POA: Insufficient documentation

## 2013-02-16 DIAGNOSIS — I499 Cardiac arrhythmia, unspecified: Secondary | ICD-10-CM | POA: Insufficient documentation

## 2013-02-16 DIAGNOSIS — F411 Generalized anxiety disorder: Secondary | ICD-10-CM | POA: Insufficient documentation

## 2013-02-16 DIAGNOSIS — R569 Unspecified convulsions: Secondary | ICD-10-CM | POA: Insufficient documentation

## 2013-02-16 DIAGNOSIS — Z91040 Latex allergy status: Secondary | ICD-10-CM | POA: Insufficient documentation

## 2013-02-16 DIAGNOSIS — F329 Major depressive disorder, single episode, unspecified: Secondary | ICD-10-CM | POA: Insufficient documentation

## 2013-02-16 DIAGNOSIS — Z79899 Other long term (current) drug therapy: Secondary | ICD-10-CM | POA: Insufficient documentation

## 2013-02-16 LAB — URINE MICROSCOPIC-ADD ON

## 2013-02-16 LAB — CBC WITH DIFFERENTIAL/PLATELET
BASOS PCT: 0 % (ref 0–1)
Basophils Absolute: 0 10*3/uL (ref 0.0–0.1)
EOS PCT: 0 % (ref 0–5)
Eosinophils Absolute: 0 10*3/uL (ref 0.0–0.7)
HEMATOCRIT: 45.4 % (ref 36.0–46.0)
Hemoglobin: 15.5 g/dL — ABNORMAL HIGH (ref 12.0–15.0)
LYMPHS PCT: 22 % (ref 12–46)
Lymphs Abs: 2.3 10*3/uL (ref 0.7–4.0)
MCH: 33.2 pg (ref 26.0–34.0)
MCHC: 34.1 g/dL (ref 30.0–36.0)
MCV: 97.2 fL (ref 78.0–100.0)
MONO ABS: 1.2 10*3/uL — AB (ref 0.1–1.0)
Monocytes Relative: 11 % (ref 3–12)
Neutro Abs: 7.1 10*3/uL (ref 1.7–7.7)
Neutrophils Relative %: 66 % (ref 43–77)
Platelets: 376 10*3/uL (ref 150–400)
RBC: 4.67 MIL/uL (ref 3.87–5.11)
RDW: 13.6 % (ref 11.5–15.5)
WBC: 10.7 10*3/uL — ABNORMAL HIGH (ref 4.0–10.5)

## 2013-02-16 LAB — BASIC METABOLIC PANEL
BUN: 19 mg/dL (ref 6–23)
CALCIUM: 10.4 mg/dL (ref 8.4–10.5)
CO2: 22 meq/L (ref 19–32)
CREATININE: 0.96 mg/dL (ref 0.50–1.10)
Chloride: 100 mEq/L (ref 96–112)
GFR calc Af Amer: 83 mL/min — ABNORMAL LOW (ref >90)
GFR calc non Af Amer: 71 mL/min — ABNORMAL LOW (ref >90)
Glucose, Bld: 112 mg/dL — ABNORMAL HIGH (ref 70–99)
Potassium: 3.7 mEq/L (ref 3.7–5.3)
Sodium: 141 mEq/L (ref 137–147)

## 2013-02-16 LAB — URINALYSIS, ROUTINE W REFLEX MICROSCOPIC
BILIRUBIN URINE: NEGATIVE
Glucose, UA: NEGATIVE mg/dL
HGB URINE DIPSTICK: NEGATIVE
Ketones, ur: 40 mg/dL — AB
Leukocytes, UA: NEGATIVE
NITRITE: NEGATIVE
PH: 6.5 (ref 5.0–8.0)
Protein, ur: 30 mg/dL — AB
Specific Gravity, Urine: 1.017 (ref 1.005–1.030)
Urobilinogen, UA: 0.2 mg/dL (ref 0.0–1.0)

## 2013-02-16 LAB — LIPASE, BLOOD: Lipase: 36 U/L (ref 11–59)

## 2013-02-16 LAB — CK: Total CK: 47 U/L (ref 7–177)

## 2013-02-16 MED ORDER — LORAZEPAM 2 MG/ML IJ SOLN
1.0000 mg | Freq: Once | INTRAMUSCULAR | Status: AC
  Administered 2013-02-16: 1 mg via INTRAVENOUS
  Filled 2013-02-16: qty 1

## 2013-02-16 MED ORDER — ONDANSETRON HCL 4 MG/2ML IJ SOLN
4.0000 mg | Freq: Once | INTRAMUSCULAR | Status: AC
  Administered 2013-02-16: 4 mg via INTRAVENOUS
  Filled 2013-02-16: qty 2

## 2013-02-16 MED ORDER — SODIUM CHLORIDE 0.9 % IV BOLUS (SEPSIS)
1000.0000 mL | Freq: Once | INTRAVENOUS | Status: AC
  Administered 2013-02-16: 1000 mL via INTRAVENOUS

## 2013-02-16 MED ORDER — LORAZEPAM 1 MG PO TABS: 1.0000 mg | ORAL_TABLET | Freq: Three times a day (TID) | ORAL | Status: DC | PRN
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
## 2013-02-16 NOTE — ED Notes (Signed)
Pt walked to bathroom and stated she felt dizzy when standing up. Pt states she feels drunk. Pt informed she received ativan and the drunk feeling was likely from the medicine. Pt stated understanding.

## 2013-02-16 NOTE — ED Notes (Signed)
Pt states she feels calmer and no longer nauseated. Pt still complains of cramping in the leg, but it is not occuring as often.
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
## 2013-02-16 NOTE — ED Notes (Signed)
Pt knows that urine is needed

## 2013-02-16 NOTE — Discharge Instructions (Signed)
Crisis de [place] ( Panic Attacks) Las crisis de [place] son ataques repentinos y [place] de Chloride, miedo o Tree surgeon extremos. Es posible que ocurran sin motivo, cuando est relajado, ansioso o cuando duerme. Las crisis de [place] pueden ocurrir por algunas de estas razones:   En ocasiones, las personas sanas presentan crisis de [place] en situaciones extremas, potencialmente mortales, como la guerra o los desastres naturales. La ansiedad normal es un mecanismo de defensa del cuerpo que nos ayuda a Firefighter ante situaciones de peligro ( respuesta de defensa o huida).  Con frecuencia, las crisis de [place] aparecen acompaadas de trastornos de ansiedad, como trastorno de pnico, trastorno de ansiedad social, trastorno de ansiedad generalizada y fobias. Los trastornos de ansiedad provocan ansiedad excesiva o incontrolable. Pueden interferir en sus vnculos u otras actividades de la vida.  En ocasiones, las crisis de ansiedad se presentan con otras enfermedades mentales, como la depresin y el trastorno por estrs postraumtico.  Algunas enfermedades, medicamentos recetados y drogas pueden provocar crisis de [place]. SNTOMAS  Las crisis de [place] comienzan repentinamente, Artist punto mximo a los 20 minutos y se presentan junto con cuatro o ms de los siguientes sntomas:  Latidos cardacos acelerados o frecuencia cardaca elevada (palpitaciones).  Sudoracin.  Temblores o sacudidas.  Dificultad para respirar o sensacin de asfixia.  Sensacin de [employer].  Dolor o Adult nurse.  Nuseas o sensacin extraa en el estmago.  Mareos o sensacin de desvanecimiento o desmayo.  Escalofros o sofocos.  Hormigueos o adormecimiento en [place] y los pies.  Sensacin de [employer] no son reales o de que no es usted.  Temor a perder el control o el juicio.  Temor a Pharmacologist. Algunos de estos sntomas pueden parecerse a enfermedades graves. Por ejemplo,  es posible que piense que tendr un ataque cardaco. Aunque las crisis de [place] pueden ser muy atemorizantes, no representan un peligro de vida. DIAGNSTICO  Las crisis de [place] se diagnostican con una evaluacin que realiza el mdico. Su mdico le realizar preguntas sobre los sntomas, como cundo y dnde ocurrieron. Tambin le preguntar sobre su historia clnica y consumo de alcohol y drogas, incluidos los medicamentos recetados. Es posible que su mdico le indique anlisis de sangre u otros estudios para Teacher, English as a foreign language graves. El mdico podr derivarlo a un profesional de la salud mental para que le realice una evaluacin ms profunda. TRATAMIENTO   En general, las personas sanas que registran una o dos crisis de [place] bajo una situacin extrema de peligro de vida, no requerirn Clinical research associate.  [place] de las crisis de [place] asociadas con trastornos de ansiedad u otras enfermedades mentales, generalmente, requiere orientacin por parte de un profesional de la salud mental o un mdico, o bien la combinacin de [place]. Su mdico le ayudar a Leisure centre manager tratamiento para usted.  Las crisis de [place] asociadas a enfermedades fsicas, generalmente, desaparecen con el tratamiento de la enfermedad. Si un medicamento recetado le causa crisis de [place], consulte a su mdico si debe suspenderlo, disminuir la dosis o sustituirlo por otro medicamento.  Las crisis de [place] asociadas al consumo de drogas o alcohol desaparecen con la abstinencia. Algunos adultos necesitan ayuda profesional con el fin de dejar de beber o usar drogas. [place] todos los medicamentos segn las indicaciones.  Consulte con su mdico antes de usar medicamentos recetados o de venta libre por primera vez.  Cumpla con todas las  [place] todos los medicamentos según las indicaciones.  · Consulte con su médico antes de usar medicamentos recetados o de venta libre por primera vez.  · Cumpla con todas las visitas de control, según le indique su médico.  SOLICITE ATENCIÓN MÉDICA SI:  · No puede tomar los medicamentos como se lo han  indicado.  · Los síntomas no mejoran o empeoran.  SOLICITE ATENCIÓN MÉDICA DE INMEDIATO SI:   · Experimenta síntomas de crisis de [place] diferentes de los que presenta habitualmente.  · Tiene pensamientos serios acerca de lastimarse a usted mismo o dañar a otras personas.  · Toma medicamentos para las crisis de [place] y presenta efectos secundarios graves.  ASEGÚRESE DE QUE:  · Comprende estas instrucciones.  · Controlará su afección.  · Recibirá ayuda de inmediato si no mejora o si empeora.  Document Released: 01/14/2005 Document Revised: 11/04/2012  ExitCare® Patient Information ©2014 [employer].

## 2013-02-16 NOTE — ED Notes (Signed)
MD (Ward) at bedside. 

## 2013-02-23 ENCOUNTER — Telehealth: Payer: Self-pay | Admitting: Neurology

## 2013-02-23 NOTE — Telephone Encounter (Signed)
Unfortunately, I only prescribe the medications we have discussed.  I have found no neurological cause for her pain but wanted to see if we can control the symptoms with the medications I typically use.  I think it is best that we refer her to a pain specialist as I don't really prescribe any other pain medications.  In the meantime, I would have her ask her PCP for any immediate medications.

## 2013-02-23 NOTE — Telephone Encounter (Signed)
Patient calls today stating she is having pain all over her body . She has had a recent Ed visit panic attack of the sort . She states she was unable to get gabapentin for some reason due to her coverage.  She is asking for something for pain . Please advise

## 2013-02-23 NOTE — Telephone Encounter (Signed)
Pt would like for a nurse to call her ASAP. She is in a lot of pain.

## 2013-03-08 ENCOUNTER — Emergency Department (INDEPENDENT_AMBULATORY_CARE_PROVIDER_SITE_OTHER): Payer: Self-pay

## 2013-03-08 ENCOUNTER — Encounter (HOSPITAL_COMMUNITY): Payer: Self-pay | Admitting: Emergency Medicine

## 2013-03-08 ENCOUNTER — Emergency Department (HOSPITAL_COMMUNITY)
Admission: EM | Admit: 2013-03-08 | Discharge: 2013-03-08 | Disposition: A | Discharge status: Home / Self Care | Payer: Medicaid Other | Source: Home / Self Care | Attending: Family Medicine | Admitting: Family Medicine

## 2013-03-08 DIAGNOSIS — S93409A Sprain of unspecified ligament of unspecified ankle, initial encounter: Secondary | ICD-10-CM

## 2013-03-08 DIAGNOSIS — S93401A Sprain of unspecified ligament of right ankle, initial encounter: Secondary | ICD-10-CM

## 2013-03-08 MED ORDER — IBUPROFEN 800 MG PO TABS: 800.0000 mg | ORAL_TABLET | Freq: Three times a day (TID) | ORAL | Status: DC

## 2013-03-08 MED ORDER — HYDROCODONE-ACETAMINOPHEN 5-325 MG PO TABS: 2.0000 | ORAL_TABLET | ORAL | Status: DC | PRN

## 2013-03-08 NOTE — ED Notes (Signed)
Pt  Reports  She  Twisted   Her  r  Foot  yest           Pt  Reports   Pain   /  Swelling   r   Foot     Especially  When  He  Bears  Weight

## 2013-03-08 NOTE — ED Provider Notes (Signed)
CSN: [id number]     Arrival date & time 03/08/13  5188 History   First MD Initiated Contact with Patient 03/08/13 1010     Chief Complaint  Patient presents with  . Foot Pain     (Consider location/radiation/quality/duration/timing/severity/associated sxs/prior Treatment) Patient is a 43 y.o. female presenting with lower extremity pain. The history is provided by the patient. No language interpreter was used.  Foot Pain This is a new problem. The current episode started yesterday. The problem occurs constantly. The problem has been gradually worsening. Nothing aggravates the symptoms. Nothing relieves the symptoms. She has tried rest for the symptoms. The treatment provided no relief.    Past Medical History  Diagnosis Date  . Dysrhythmia     not followed by cardiogist  . Anemia   . Blood transfusion   . Seizures   . Headache(784.0)   . Depression   . Abdominal pain     takes omeprazole  . Hypertension     dr Huey Bienenstock  . Chronic back pain greater than 3 months duration   . Chronic neck pain    Past Surgical History  Procedure Laterality Date  . Abdominal hysterectomy    . Cesarean section      x2  . Abdominoplasty    . Cervical spine surgery      2002  . Shoulder surgery      left humeral head replacement 2001  . Breast surgery      breast implants x 2  . Cholecystectomy  02/28/2011    Procedure: LAPAROSCOPIC CHOLECYSTECTOMY;  Surgeon: Joyice Faster. Cornett, MD;  Location: [facility];  Service: General;  Laterality: N/A;  Laparoscopic cholecystectomy.  . Ventral hernia repair  08/22/2011    Procedure: LAPAROSCOPIC VENTRAL HERNIA;  Surgeon: Joyice Faster. Cornett, MD;  Location: [facility];  Service: General;  Laterality: N/A;  . Hernia repair  08/22/11    Ventral  . Back surgery     Family History  Problem Relation Age of Onset  . Cancer Mother     colon, skin  . Anesthesia problems Mother   . Heart disease Father    History  Substance Use Topics  . Smoking status: Never Smoker    . Smokeless tobacco: Never Used  . Alcohol Use: No   OB History   Grav Para Term Preterm Abortions TAB SAB Ect Mult Living                 Review of Systems  Unable to perform ROS Musculoskeletal: Positive for gait problem and joint swelling.  Skin: Negative for rash.  All other systems reviewed and are negative.      Allergies  Latex and Naproxen  Home Medications   Current Outpatient Rx  Name  Route  Sig  Dispense  Refill  . HYDROcodone-acetaminophen (NORCO/VICODIN) 5-325 MG per tablet   Oral   Take 2 tablets by mouth every 4 (four) hours as needed.   10 tablet   0   . ibuprofen (ADVIL,MOTRIN) 800 MG tablet   Oral   Take 1 tablet (800 mg total) by mouth 3 (three) times daily.   21 tablet   0   . lisinopril-hydrochlorothiazide (PRINZIDE,ZESTORETIC) 20-25 MG per tablet   Oral   Take 1 tablet by mouth daily.         Marland Kitchen LORazepam (ATIVAN) 1 MG tablet   Oral   Take 1 tablet (1 mg total) by mouth 3 (three) times daily as needed for anxiety.  15 tablet   0    BP 121/65  Pulse 88  Temp(Src) 98.2 F (36.8 C) (Oral)  Resp 16  SpO2 100% Physical Exam  Constitutional: She is oriented to person, place, and time. She appears well-developed and well-nourished.  HENT:  Head: Normocephalic and atraumatic.  Cardiovascular: Normal rate.   Pulmonary/Chest: Effort normal.  Musculoskeletal: She exhibits tenderness.  Tender lateral malleolus  From  nv nad ns inact  Neurological: She is alert and oriented to person, place, and time. She has normal reflexes.  Skin: Skin is warm and dry.  Psychiatric: She has a normal mood and affect.    ED Course  Procedures (including critical care time) Labs Review Labs Reviewed - No data to display Imaging Review Dg Foot Complete Right  03/08/2013   CLINICAL DATA:  Twisted foot yesterday, heel pain  EXAM: RIGHT FOOT COMPLETE - 3+ VIEW  COMPARISON:  None.  FINDINGS: Three views of the right foot submitted. No acute fracture or  subluxation. Metallic fixation screw noted distal aspect proximal phalanx first toe.  IMPRESSION: Negative.   Electronically Signed   By: Lahoma Crocker M.D.   On: 03/08/2013 10:45      MDM   Final diagnoses: ankle sprain   None  Sprain  aso and crutches follow up with Dr. Elenora Gamma, PA-C 03/08/13 1124  Medical screening examination/treatment/procedure(s) were performed by a resident physician or non-physician practitioner and as the supervising physician I was immediately available for consultation/collaboration.  Lynne Leader, MD    Gregor Hams, MD 03/10/13 [phone]

## 2013-03-08 NOTE — Discharge Instructions (Signed)

## 2013-03-29 ENCOUNTER — Ambulatory Visit (INDEPENDENT_AMBULATORY_CARE_PROVIDER_SITE_OTHER): Payer: Medicaid Other | Admitting: Neurology

## 2013-03-29 ENCOUNTER — Encounter: Payer: Self-pay | Admitting: Neurology

## 2013-03-29 VITALS — BP 116/74 | HR 70 | Temp 97.0°F | Resp 18 | Ht 67.0 in | Wt 195.0 lb

## 2013-03-29 DIAGNOSIS — G894 Chronic pain syndrome: Secondary | ICD-10-CM

## 2013-03-29 DIAGNOSIS — R202 Paresthesia of skin: Secondary | ICD-10-CM

## 2013-03-29 DIAGNOSIS — R209 Unspecified disturbances of skin sensation: Secondary | ICD-10-CM
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
## 2013-03-29 NOTE — Patient Instructions (Addendum)
I have no specific cause for your pain.  At this point, I think we need to send you to a pain specialist.  Have your primary care doctor make that referral.

## 2013-04-07 ENCOUNTER — Encounter (HOSPITAL_BASED_OUTPATIENT_CLINIC_OR_DEPARTMENT_OTHER): Payer: Medicaid Other

## 2013-04-12 ENCOUNTER — Encounter: Payer: Self-pay | Admitting: Gynecology

## 2013-04-12 ENCOUNTER — Other Ambulatory Visit (HOSPITAL_COMMUNITY)
Admission: RE | Admit: 2013-04-12 | Discharge: 2013-04-12 | Disposition: A | Discharge status: Home / Self Care | Payer: No Typology Code available for payment source | Source: Ambulatory Visit | Attending: Gynecology | Admitting: Gynecology

## 2013-04-12 ENCOUNTER — Ambulatory Visit (INDEPENDENT_AMBULATORY_CARE_PROVIDER_SITE_OTHER): Payer: No Typology Code available for payment source | Admitting: Gynecology

## 2013-04-12 VITALS — BP 126/78 | Ht 66.5 in | Wt 194.0 lb

## 2013-04-12 DIAGNOSIS — N3281 Overactive bladder: Secondary | ICD-10-CM | POA: Insufficient documentation

## 2013-04-12 DIAGNOSIS — R87810 Cervical high risk human papillomavirus (HPV) DNA test positive: Secondary | ICD-10-CM | POA: Diagnosis present

## 2013-04-12 DIAGNOSIS — N949 Unspecified condition associated with female genital organs and menstrual cycle: Secondary | ICD-10-CM

## 2013-04-12 DIAGNOSIS — Z1151 Encounter for screening for human papillomavirus (HPV): Secondary | ICD-10-CM | POA: Insufficient documentation

## 2013-04-12 DIAGNOSIS — M549 Dorsalgia, unspecified: Secondary | ICD-10-CM

## 2013-04-12 DIAGNOSIS — IMO0002 Reserved for concepts with insufficient information to code with codable children: Secondary | ICD-10-CM

## 2013-04-12 DIAGNOSIS — R102 Pelvic and perineal pain: Secondary | ICD-10-CM

## 2013-04-12 DIAGNOSIS — Z01419 Encounter for gynecological examination (general) (routine) without abnormal findings: Secondary | ICD-10-CM

## 2013-04-12 DIAGNOSIS — Z124 Encounter for screening for malignant neoplasm of cervix: Secondary | ICD-10-CM | POA: Insufficient documentation

## 2013-04-12 DIAGNOSIS — N318 Other neuromuscular dysfunction of bladder: Secondary | ICD-10-CM

## 2013-04-12 DIAGNOSIS — Z23 Encounter for immunization: Secondary | ICD-10-CM

## 2013-04-12 DIAGNOSIS — N393 Stress incontinence (female) (male): Secondary | ICD-10-CM | POA: Insufficient documentation

## 2013-04-12 LAB — URINALYSIS W MICROSCOPIC + REFLEX CULTURE
Bilirubin Urine: NEGATIVE
Casts: NONE SEEN
Crystals: NONE SEEN
Glucose, UA: NEGATIVE mg/dL
KETONES UR: NEGATIVE mg/dL
LEUKOCYTES UA: NEGATIVE
Nitrite: NEGATIVE
Protein, ur: NEGATIVE mg/dL
Specific Gravity, Urine: 1.025 (ref 1.005–1.030)
UROBILINOGEN UA: 0.2 mg/dL (ref 0.0–1.0)
WBC, UA: NONE SEEN WBC/hpf (ref <3)
pH: 5.5 (ref 5.0–8.0)

## 2013-04-12 NOTE — Addendum Note (Signed)
Addended by: Su Grand A on: 04/12/2013 04:10 PM   Modules accepted: Orders

## 2013-04-12 NOTE — Patient Instructions (Addendum)
Estudios urodinmicos (Urodynamic Testing) Si tiene un problema de prdida de [place], los estudios urodinmicos pueden ser de [place]. Estos estudios no invasivos sern de ayuda para Energy manager causa del problema. El envejecimiento, las enfermedades o las lesiones pueden causar problemas en el aparato urinario. Los msculos de los urteres, la vejiga y la uretra pueden debilitarse con la edad. Pueden aumentar las infecciones urinarias y los clculos en la vejiga debido al debilitamiento de los msculos de la vejiga. Probablemente, los msculos no pueden vaciar la vejiga por completo. Adems, el debilitamiento del esfnter uretral y los msculos del suelo plvico pueden causar incontinencia urinaria de esfuerzo. Esto se debe a que Barrister's clerk del esfnter no alcanza para Consulting civil engineer orina en el vejiga o a que Social worker no tiene suficiente sostn de los msculos plvicos. La urodinmica es el estudio de la forma en que organismo almacena y excreta la [place]. Estos estudios ayudan al mdico a ver el funcionamiento de los msculos de la vejiga y Therapist, art, y pueden ser tiles para [employer] siguientes sntomas:  Imposibilidad de Aeronautical engineer orina (incontinencia).  Urgencia repentina e intensa de orinar.  Dolor al [place].  Infecciones urinarias recurrentes.  Ganas de orinar con frecuencia.  Problemas para comenzar a Counselling psychologist orina.  Problemas para vaciar completamente la vejiga. PREPARACIN PARA EL ESTUDIO Si el mdico recomienda un estudio de vejiga, por lo general no se necesita una preparacin especial. Asegrese de entender las indicaciones que recibe. En funcin del estudio, tal vez se le pida que concurra con la vejiga llena o vaca. Adems, pregunte si debe cambiar la dieta o saltear los medicamentos que toma regularmente y durante cunto [place]. RESPECTO DEL ESTUDIO Cualquier procedimiento diseado para brindar informacin sobre un problema de vejiga puede denominarse estudio  urodinmico. Su mdico querr saber lo siguiente:  Si tiene dificultad para comenzar a Sports administrator.  Qu tanto tiene que esforzarse para Garment/textile technologist.  Si el chorro de [place] se interrumpe.  Si le quedan restos de [place] en la vejiga cuando termina (orina residual). Los estudios urodinmicos pueden abarcar desde la simple observacin Edison International mediciones exactas mediante el uso de instrumentos. MTODOS DE REALIZACIN DEL ESTUDIO El tipo de estudio que se realiza depende del problema que tiene. Los Abbott Laboratories se describen a continuacin.  El uso de un aparato de diagnstico por imgenes. Este aparato filma la miccin.  Orinar detrs de una cortina mientras un mdico o enfermero escuchan.  Si el problema es la prdida, la prueba de la compresa es un mtodo sencillo de medir la cantidad de orina que gotea. Se le entregar una cantidad de compresas absorbentes y bolsas de plstico. Se le indicar que use la compresa a lo largo de 1 o 2horas y Air traffic controller la coloque en Ardelia Mems bolsa y Pharmacist, hospital. Luego, su mdico pesar las bolsas para determinar la cantidad de orina que qued en la compresa. Un mtodo ms sencillo pero menos exacto es que cambie las compresas con la frecuencia que sea Joseph, y Field seismologist un seguimiento de la cantidad que [place] por Training and development officer.  Tambin se Chartered certified accountant examen fsico para Paramedic otras causas de los problemas urinarios que podran incluir el debilitamiento de los msculos plvicos (prolapso de la pelvis) o el agrandamiento de la prstata.  Estudio de presin y flujo: vaciar la vejiga de modo que un catter pueda medir las presiones necesarias para Garment/textile technologist. El estudio ayuda a identificar las causas de la obstruccin infravesical que los hombres pueden presentar  cuando hay agrandamiento de la prstata. La obstruccin infravesical es menos frecuente en las mujeres, pero puede ocurrir con la hernia de vejiga o rara vez despus de un procedimiento quirrgico por incontinencia  urinaria.  Electromiografa (medicin de los impulsos nerviosos): si su mdico piensa que el problema urinario que padece guarda relacin con una lesin nerviosa, tal vez le hagan una electromiografa. Este estudio mide la actividad muscular del esfnter uretral. Se colocan sensores sobre la piel cerca de la uretra y del recto. La actividad muscular se registra en un aparato. Los patrones de los impulsos muestran si los mensajes que se envan a la vejiga y la uretra estn correctamente coordinados.  Videourodinamia: puede realizarse con o sin un aparato que toma imgenes de la vejiga durante el llenado y [place]. El aparato de diagnstico por imgenes puede usar rayosX u ondas sonoras. Si se [place] un aparato de rayosX, el lquido que se emplea para llenar la vejiga puede ser un medio de contraste que aparecer en la radiografa. Las Loews Corporation y los videos muestran el tamao y la forma del tracto urinario, y [place] a que su mdico entienda cul es el [place]. DESPUS DEL ESTUDIO Puede tener un malestar leve durante algunas horas despus de [place]. Liberty Media vasos de agua de 8onzas (256ml) por hora durante 2horas puede ser de [place]. Pregntele a su mdico si puede darse un bao tibio. Si no es as, tal vez pueda colocarse un pao hmedo tibio sobre el orificio uretral, ya que esto puede Runner, broadcasting/film/video. Pueden indicarle que tome antibiticos para prevenir una infeccin. Llame a su mdico si tiene signos de infeccin, entre otros, [place], escalofros o [place]. OBTENCIN DE LOS RESULTADOS DE LAS PRUEBAS Puede analizar los Sears Holdings Corporation estudios sencillos con su mdico inmediatamente despus de su realizacin. Los Mohawk Industries de otros estudios pueden tomar RadioShack. Tendr tiempo para hacer preguntas MetLife y los posibles tratamientos para su problema. Es su responsabilidad retirar el resultado del [place]. Consulte en el laboratorio cundo y cmo podr Regions Financial Corporation. Marthenia Rolling MS INFORMACIN  Asociacin Kathee Polite (American Urological Association): www.auanet.org Fundacin [place] (Lowe's Companies for Urologic Disease): www.afud.org Asociacin Estadounidense para la Cistitis Intersticial (Interstitial Cystitis Association of [place]): SanJoseBakeries.it Centro Nacional de Informacin sobre Enfermedades Renales y Pharmacologist (National Kidney and Urologic Diseases Information Clearinghouse): nkudic@info .AmenCredit.is Document Released: 11/04/2012 [facility] Patient Information 2014 [place], [place]. Vejiga hiperactiva - Adultos  (Overactive Bladder, Adult)  La vejiga tiene dos funciones que son totalmente opuestas. Una de ellas es relajarse y agrandarse para Oncologist orina (se llena como un globo), y la otra es contraerse y apretar hacia abajo de modo que pueda vaciar la orina que se ha almacenado. El correcto funcionamiento de la vejiga es una mezcla compleja de [place] funciones. El llenado y vaciado de la vejiga puede estar influenciado por:   La vejiga.  La mdula espinal.  El cerebro.  Los nervios que Lucianne Lei a la vejiga.  Otros rganos estrechamente relacionados con la vejiga, como la prstata en los hombres y la vagina en las mujeres. A medida que la vejiga se llena de orina, enva seales nerviosas al cerebro para informarle que tiene que orinar. La miccin normal requiere que la vejiga se contraiga hacia abajo con la fuerza suficiente como para vaciarla, pero tambin requiere que se contraiga el tiempo suficiente como para finalizar la accin. Adems, los msculos del esfnter, que normalmente impiden las fugas de [place], [place]  deben relajarse para que la orina pueda pasar. Es necesaria la coordinacin entre el msculo de la vejiga que empuja hacia abajo y los msculos del esfnter que se relajan para que todo ocurra con normalidad.  En una vejiga hiperactiva los msculos se  contraen de forma inesperada e involuntaria y esto provoca la necesidad urgente de Garment/textile technologist. La respuesta normal es tratar de Administrator, Civil Service orina contrayendo los msculos del esfnter. A veces, la vejiga se contrae con tanta fuerza que los msculos del esfnter no pueden impedir que la orina pase y se produce la incontinencia. Este tipo de incontinencia se llama incontinencia a la urgencia.  Cuando la vejiga es hiperactiva puede haber situaciones embarazosas. Puede interferir en vivir la vida de la manera que usted desea. Muchas personas piensan que es algo que deben soportar debido al envejecimiento o a problemas de [place]. Sin embargo, existen tratamientos que pueden ayudar a Field seismologist su vida menos complicada y ms agradable.  CAUSAS  Son Avaya factores que pueden producir una vejiga hiperactiva. Ellos son:   Infeccin del tracto urinario o infeccin de los tejidos cercanos, como la prstata.  Agrandamiento de la prstata.  En las mujeres, los embarazos mltiples o una ciruga en el tero o la uretra.  Clculos , inflamacin o tumores en la vejiga.  La cafena.  El alcohol.  Medicamentos. Por ejemplo, los diurticos (medicamentos que ayudan al cuerpo a Clinical research associate del exceso de lquido) aumentan la produccin de [place]. Algunos medicamentos que deben tomarse con mucho lquido.  Debilidad muscular o nerviosa. Esto podra ser el resultado de una lesin de la mdula espinal, un ictus, esclerosis mltiple o enfermedad de Parkinson.  La diabetes puede producir un alto volumen de orina que llena la vejiga tan rpidamente que el impulso normal de orinar se activa muy intensamente. SNTOMAS   Prdida del control de la vejiga. Siente necesidad de Garment/textile technologist y no Engineer, manufacturing.  Urgencia repentina de orinar.  Orina 8 o ms veces por da.  Levantarse para Liberty Mutual o ms veces por noche. DIAGNSTICO  Para diagnosticar vejiga hiperactiva, el mdico probablemente:   Preguntar sobre los sntomas que  ha notado.  Preguntar acerca de su salud en general. Incluir preguntas sobre cualquier medicamento que est tomando.  Realizar un examen fsico. Esto ayudar a determinar si hay obstrucciones evidentes u otros problemas.  Indicar algunos exmenes. Pueden incluir:  Un anlisis de sangre para Hydrographic surveyor diabetes u otros problemas de salud que podran estar contribuyendo al problema.  Anlisis de [place]. Incluir la medicin del flujo de [place] y la presin sobre la vejiga.  Un estudio del sistema neurolgico (cerebro, mdula espinal y nervios). Este es el sistema que detecta la necesidad de Garment/textile technologist. Algunas de OGE Energy son las pruebas de flujo, pruebas de presin en la vejiga y mediciones elctricas del msculo del Nurse, children's.  Un estudio de la vejiga para comprobar si se vaca completamente al Continental Airlines.  Una citoscopa. En este estudio se [place] un tubo delgado con una pequea cmara. Ofrece la visin del interior de la uretra y la vejiga para ver si hay algn problema.  Diagnstico por imgenes. Le administrarn una sustancia de contraste y Schering-Plough pedirn que orine. Se toman radiografas para ver el funcionamiento de la vejiga. TRATAMIENTO  Una vejiga hiperactiva puede tratarse de Albertson's. El tratamiento depende de la causa. Tambin vara si su caso es leve o grave. Generalmente el tratamiento se puede administrar en el consultorio mdico o en la clnica. Asegrese de  comentar las diferentes opciones con su [place] opciones pueden ser:   Tratamientos conductuales. No incluyen medicamentos ni ciruga:  Entrenamiento de la vejiga. En este caso debe seguir un programa de horarios para orinar a intervalos regulares. Esto le ayudar a aprender a Chief Technology Officer las ganas de Garment/textile technologist. Al principio, es posible que le indiquen que espere unos minutos despus de sentir deseos de Garment/textile technologist. Con el tiempo, usted debe ser capaz de programar ir al bao con ms de una hora de distancia.  Ejercicios  de Kegel. Estos ejercicios fortalecen los msculos del piso plvico, que sostienen la vejiga. Al tonificar estos msculos, podr controlar la necesidad de orinar, incluso si los msculos de la vejiga son hiperactivos. Un especialista le ensear cmo hacer estos ejercicios correctamente. Se requerir prctica diaria.  Prdida de peso. Si usted es obeso o tiene sobrepeso, perder peso podra mejorar la vejiga hiperactiva. Hable con su mdico acerca de cuntos kilos debe perder. Tambin pregunte si hay un programa o mtodo especfico que funcione mejor para usted.  Cambio de dieta. Podran sugerirle esta opcin si el estreimiento empeora su vejiga hiperactiva. El mdico o un nutricionista pueden explicarle las formas de modificar su dieta para Cytogeneticist. Otras personas mejorarn si toman menos cafena o alcohol. En algunos casos es necesario beber menos lquidos.  Proteccin. Esto no es Radiographer, therapeutic. Sin embargo, podra usar apsitos especiales para detener eventuales prdidas mientras espera que otros tratamientos sean efectivos. Esto le ayudar a evitar situaciones vergonzantes.  Tratamientos fsicos.  Estimulacin elctrica. Los electrodos envan AutoZone a los nervios o a los msculos que ayudan a Aeronautical engineer vejiga. El objetivo es fortalecerlos. En algunos casos esto se hace con los electrodos fuera del cuerpo. O bien, pueden colocarlos en el interior del cuerpo (implante). Este tratamiento puede demorar varios meses en surtir Federal-Mogul.  Medicamentos. stos se utilizan generalmente junto con otros tratamientos. Hay varios medicamentos disponibles. Algunos se inyectan en los msculos que intervienen en la miccin. Otros vienen en forma de pldora. Los medicamentos que se prescriben incluyen:  Anticolinrgicos. Estos medicamentos bloquean las seales que los nervios envan a la vejiga. Impiden la liberacin de la orina cuando no es el momento [place]. Los investigadores  consideran que los medicamentos podran ayudar tambin de [place].  Imipramina. Es un antidepresivo. Relaja msculos de la vejiga.  Botox. Todava se encuentra en etapa experimental. Algunas personas creen que al Eastman Kodak de la vejiga, estos se relajan para que trabajen con mayor normalidad. Tambin se ha inyectado en el msculo del esfnter cuando no se abre correctamente. Sin embargo, se trata de una solucin transitoria. Tambin, podra Automatic Data, sobre todo E. [employer]. [employer] Pont.  Ciruga.  Puede implantarse un dispositivo para ayudar a Qwest Communications. Funciona en los nervios que envan seales cuando hay necesidad de orinar.  La ciruga a veces es necesaria con Transport planner. Si se implantan electrodos, se hace a travs de la [place].  A veces la reparacin debe hacerse a travs de la [place]. Por ejemplo, el tamao de la vejiga se puede cambiar. Generalmente slo en casos graves. Parc todos los medicamentos que el mdico le recete o Multimedia programmer. [place].  Haga los cambios de estilo de vida que se le indican. Pueden incluir:  Beber menos lquido o beber en diferentes momentos del da. Si necesita orinar con frecuencia durante la noche, por ejemplo, es posible  que tenga que dejar de tomar lquidos temprano por la noche.  Reducir el consumo de cafena o alcohol. Ambos pueden empeorar la vejiga hiperactiva. La cafena se encuentra en el caf, el t y [place].  Hacer ejercicios de Kegel para fortalecer los msculos.  Bajar de peso, si se lo indican.  Consuma una dieta saludable y equilibrada. Esto le ayudar a Contractor.  Lleve un diario o un registro. Es posible que se le pida que registre la cantidad que bebe y [place], y tambin cuando se sienta la necesidad de Garment/textile technologist.  Aprenda cmo cuidar los implantes u otros dispositivos, tales como  pesarios. SOLICITE ATENCIN MDICA SI:   La vejiga hiperactiva empeora.  Siente dolor o tiene irritacin al Continental Airlines.  Observa sangre en la orina.  Tiene preguntas sobre cualquier medicamento o dispositivos que su mdico indique.  Nota sangre, pus o hinchazn en el lugar en que le han realizado las pruebas o procedimientos de [place].  La temperatura oral se eleva sin motivo por arriba de 102F (38,9C). SOLICITE ATENCIN MDICA DE INMEDIATO SI:  La temperatura oral le sube a ms de 102 (38,9C) y no puede bajarla con medicamentos.  Document Released: 01/01/2012 [facility] Patient Information 2014 [place], [place].

## 2013-04-12 NOTE — Progress Notes (Signed)
[name] 14-May-1970 [id number]   History:    43 y.o.  for annual gyn exam who has not been seen in the office was 2009. Patient on August 2009 had a TAH RSO secondary to dysmenorrhea, menorrhagia, endometrial polyps and chronic right lower quadrant pain and prior history of endometriosis that contributed to her dyspareunia. Her pathology report and demonstrated benign cervix, secretory endometrium associated with endometrial polyps, leiomyomatous uteri, adenomyosis, right fallopian tube and ovary with a right follicular cysts and normal right fallopian tube.  The patient has had history in the past overactive bladder and had done well on VESIcare 5 mg daily but it has been also medication for several years now she does states that she gets up at night twice to urinate and recently has experienced leakage of urine when coughing or lifting or strenuous activity. She also thought that her urine had some foul odor  Recently.  Patient in [place] had breast augmentation with revision consisting of silicone. She also had liposculpture and as well an abdominoplasty. Patient denied any past history of abnormal Pap smears.  Past medical history,surgical history, family history and social history were all reviewed and documented in the EPIC chart.  Gynecologic History No LMP recorded. Patient has had a hysterectomy. Contraception: status post hysterectomy Last Pap: 2009. Results were: normal Last mammogram: 2014. Results were: normal  Obstetric History OB History  Gravida Para Term Preterm AB SAB TAB Ectopic Multiple Living  0                  ROS: A ROS was performed and pertinent positives and negatives are included in the history.  GENERAL: No fevers or chills. HEENT: No change in vision, no earache, sore throat or sinus congestion. NECK: No pain or stiffness. CARDIOVASCULAR: No chest pain or pressure. No palpitations. PULMONARY: No shortness of breath, cough or  wheeze. GASTROINTESTINAL: No abdominal pain, nausea, vomiting or diarrhea, melena or bright red blood per rectum. GENITOURINARY: No urinary frequency, urgency, hesitancy or dysuria. MUSCULOSKELETAL: No joint or muscle pain, no back pain, no recent trauma. DERMATOLOGIC: No rash, no itching, no lesions. ENDOCRINE: No polyuria, polydipsia, no heat or cold intolerance. No recent change in weight. HEMATOLOGICAL: No anemia or easy bruising or bleeding. NEUROLOGIC: No headache, seizures, numbness, tingling or weakness. PSYCHIATRIC: No depression, no loss of interest in normal activity or change in sleep pattern.   Complaining of low back discomfort foul odor urine and stress urinary incontinence  Exam: chaperone present  BP 126/78  Ht 5' 6.5" (1.689 m)  Wt 194 lb (87.998 kg)  BMI 30.85 kg/m2  Body mass index is 30.85 kg/(m^2).  General appearance : Well developed well nourished female. No acute distress HEENT: Neck supple, trachea midline, no carotid bruits, no thyroidmegaly Lungs: Clear to auscultation, no rhonchi or wheezes, or rib retractions  Heart: Regular rate and rhythm, no murmurs or gallops Breast:Examined in sitting and supine position were symmetrical in appearance, no palpable masses or tenderness,  no skin retraction, no nipple inversion, no nipple discharge, no skin discoloration, no axillary or supraclavicular lymphadenopathy Abdomen: no palpable masses or tenderness, no rebound or guarding Extremities: no edema or skin discoloration or tenderness  Pelvic:  Bartholin, Urethra, Skene Glands: Within normal limits             Vagina: No gross lesions or discharge  Cervix: Absent  Uterus absent Adnexa  Without masses or tenderness  Anus and perineum  normal   Rectovaginal  normal sphincter tone without palpated masses or tenderness             Hemoccult not indicated   Q-tip angle test less than 30 Patient was examined in supine and erect position and when she coughed she did not  leak urine  Urinalysis today: Too numerous to count RBCs, rare bacteria, mucus  Assessment/Plan:  43 y.o. female for annual exam has not been seen in the office was 2009. Patient complaining of getting up at night at least twice to urinate. She feels sometimes during the day she doesn't empty completely. But she does state that when she coughs or sneeze she is noticing that she leaks urine and is wearing a pad canal. She will be scheduled for urodynamic evaluation here in the office in the next few weeks. Patient's PCP Dr. Marva Panda has been doing her blood work. Patient did receive the Tdap vaccine today. We did do a Pap smear today but she will no longer need them after today according to the new guidelines. Literature information on urodynamic testing as well as on overactive bladder was provided. She was reminded to schedule her mammogram later this year. We discussed importance of monthly breast exam. Will await the results of the urine culture. She was given samples of Uribell anti-spasmodic agent to take one by mouth 4 times a day for 2 days as we wait for the urine culture .  Note: This dictation was prepared with  Dragon/digital dictation along withSmart phrase technology. Any transcriptional errors that result from this process are unintentional.   Terrance Mass MD, 3:31 PM 04/12/2013

## 2013-04-12 NOTE — Addendum Note (Signed)
Addended by: Su Grand A on: 04/12/2013 03:47 PM   Modules accepted: Orders

## 2013-04-13 LAB — URINE CULTURE
COLONY COUNT: NO GROWTH
ORGANISM ID, BACTERIA: NO GROWTH

## 2013-04-15 ENCOUNTER — Other Ambulatory Visit: Payer: Self-pay | Admitting: Gynecology

## 2013-04-15 ENCOUNTER — Telehealth: Payer: Self-pay

## 2013-04-15 MED ORDER — NITROFURANTOIN MONOHYD MACRO 100 MG PO CAPS: 100.0000 mg | ORAL_CAPSULE | Freq: Two times a day (BID) | ORAL | Status: DC

## 2013-04-15 MED ORDER — PHENAZOPYRIDINE HCL 200 MG PO TABS: 200.0000 mg | ORAL_TABLET | Freq: Three times a day (TID) | ORAL | Status: DC

## 2013-04-15 NOTE — Telephone Encounter (Signed)
Per Dr. Durenda Guthrie request to schedule Urodynamics for this patient I called to check ins benefits.  Per Tobie Poet there will be a $40 copymt. No ded or co-insurance.  Per Cedric there is not precertification required as long as outpatient.  Sharrie Rothman will be notified so she can schedule for this patient.

## 2013-04-15 NOTE — Telephone Encounter (Signed)
On her recent labs you had recommended repeating her u/a when she comes for Urodynamics due to micro hematuria on u/a.  However, patient is calling today and she states that she is "having pain with urination and back pain".  She wants to see if you will call her in Rx?  Rosemarie Ax spoke with patient in [place].)

## 2013-04-15 NOTE — Telephone Encounter (Signed)
Pyridium 200 MG 4 times a day for 2 days will be fine as long as she is not allergic to sulfa. Thank you

## 2013-04-15 NOTE — Telephone Encounter (Signed)
Please call a prescription for Macrobid 1 by mouth twice a day for 7 days. Also calling prescription for Uribell one by mouth 4 times a day for 2 days. Please advise her that her urine did turn blue.

## 2013-04-15 NOTE — Telephone Encounter (Signed)
Dr. Moshe Salisbury- I get an allergy/reaction warning for the Uribel-some sort of relation to her Naproxen allergy.  I tried Pyridium 200mg  and there are no warnings for her for that. Would you like to change the Rx?

## 2013-04-15 NOTE — Telephone Encounter (Signed)
Rxs called in. Rosemarie Ax will notify patient.

## 2013-04-19 ENCOUNTER — Encounter: Payer: Self-pay | Admitting: Gynecology

## 2013-04-22 ENCOUNTER — Ambulatory Visit (INDEPENDENT_AMBULATORY_CARE_PROVIDER_SITE_OTHER): Payer: No Typology Code available for payment source | Admitting: Gynecology

## 2013-04-22 ENCOUNTER — Encounter: Payer: Self-pay | Admitting: Gynecology

## 2013-04-22 VITALS — BP 120/78

## 2013-04-22 DIAGNOSIS — Z87442 Personal history of urinary calculi: Secondary | ICD-10-CM

## 2013-04-22 DIAGNOSIS — N9089 Other specified noninflammatory disorders of vulva and perineum: Secondary | ICD-10-CM

## 2013-04-22 DIAGNOSIS — R319 Hematuria, unspecified: Secondary | ICD-10-CM

## 2013-04-22 LAB — URINALYSIS W MICROSCOPIC + REFLEX CULTURE
Bilirubin Urine: NEGATIVE
CRYSTALS: NONE SEEN
Casts: NONE SEEN
Glucose, UA: NEGATIVE mg/dL
Ketones, ur: NEGATIVE mg/dL
Leukocytes, UA: NEGATIVE
NITRITE: NEGATIVE
PROTEIN: NEGATIVE mg/dL
Urobilinogen, UA: 1 mg/dL (ref 0.0–1.0)
WBC, UA: NONE SEEN WBC/hpf (ref <3)
pH: 6 (ref 5.0–8.0)

## 2013-04-22 MED ORDER — MEPERIDINE HCL 50 MG PO TABS: ORAL_TABLET | ORAL | Status: DC

## 2013-04-22 MED ORDER — ACYCLOVIR 5 % EX CREA: 1.0000 "application " | TOPICAL_CREAM | CUTANEOUS | Status: DC

## 2013-04-22 NOTE — Progress Notes (Addendum)
   Patient is a 43 year old who was seen in the office on March 16 for the first time since 2009. Patient states that she's been having left flank discomfort as well as left lower quadrant abdominal pain. She stated that last year she was seen by the ED were PCP with several bouts of what appears to have been kidney stones that she passed. Patient had been complaining of getting up at night at least twice to urinate and feels that time she does not empty her bladder completely. We did a urine culture which was negative and temporarily had placed her on Uribell for a few days as we waited for the urine culture. Patient also states that when she coughs or sneeze she is noticing that she leaks urine and is wearing a pad. The patient in 2009 had a total abdominal hysterectomy with right salpingo-oophorectomy secondary to dysmenorrhea, menorrhagia, endometrial polyps and chronic right lower quadrant pain with prior history of endometriosis. Her pathology report demonstrated the following:  benign cervix, secretory endometrium associated with endometrial polyps, leiomyomatous uteri, adenomyosis, right fallopian tube and ovary with a right follicular cysts and normal right fallopian tube.  The patient has had a history of overactive bladder in the past and had taken VESIcare 5 mg daily but she has not been taking the medication recently.  At time of her last office visit during the pelvic exam her Q-tip angle test was less than 30. But in the supine and erect position with patient cough she did leak urine.  Exam today: Abdomen: Slight tenderness left lower abdomen but no rebound or guarding Back: Mild left CVA tenderness Pelvic exam: Bartholin urethra Skene was within normal limits Left labia majora inferior portion vesicular-like area highly suspicious for HSV. Culture obtained. Vagina: No lesions or discharge Bimanual exam tenderness suprapubically but radiating more to the left lower quadrant without  rebound guarding Rectal exam: Not done    Urinalysis today demonstrated too numerous to count RBCs and rare bacteria urine submitted for culture once again  Assessment/plan: #1 patient with past history of nephrolithiasis may be having recurrent episodes contributing to her persistent hematuria. We will prescribe her Demerol 50 mg to take 1 by mouth 3 times a day when necessary and increase her fluid intake and we will make arrangements for her to follow up with urologist next week. #2 stress urinary incontinence we'll cancel the urodynamic evaluation here in our office and have it done at the urologist office when she sees him for the above-mentioned problem. #3 left labia majora vesicular-like lesion highly suspicious for herpes culture obtained.

## 2013-04-22 NOTE — Patient Instructions (Signed)
Hematuria en adultos (Hematuria, Adult) La hematuria es la presencia de sangre en la orina. La causa puede ser una infeccin en la vejiga, en los riones, en la prstata, clculos renales o cncer de las vas urinarias. Normalmente las infecciones pueden tratarse con medicamentos, y los clculos renales, por lo general, se eliminarn a travs de la orina. Si ninguna de estas es la causa de su hematuria, quizs se necesiten ms estudios para Education officer, community. Es muy importante que le informe a su mdico si ve sangre en la [place], aunque el sangrado se detenga sin tratamiento o no cause dolor. La sangre que aparece en la orina y luego se detiene y vuelve a aparecer nuevamente puede ser un sntoma de una enfermedad muy grave. Adems, el dolor no es un sntoma en las etapas iniciales de muchos tipos de cncer urinarios. INSTRUCCIONES PARA EL CUIDADO EN EL HOGAR   Beba mucho lquido, entre 3 a Software engineer. Si le han diagnosticado una infeccin, se recomienda especialmente el jugo de arndanos rojos, adems de grandes cantidades de [place].  Evite la cafena, el t y las bebidas con gas porque suelen irritar la vejiga.  Evite el alcohol ya que puede Engineer, manufacturing.  Utilice los medicamentos de venta libre o recetados para Glass blower/designer, Health and safety inspector o la fiebre, segn se lo indique el mdico.  Si le han diagnosticado clculos renales, siga las instrucciones de su mdico con respecto a Astronomer la orina para Research scientist (medical) clculo.  Vace la vejiga con frecuencia. Evite retener la orina durante largos perodos.  Despus de defecar, las mujeres deben higienizarse desde adelante hacia atrs. Use solo un papel tissue por vez.  Si es [employer], vace la vejiga antes y despus de Clinical biochemist. SOLICITE ATENCIN MDICA SI: Tiene dolor en la espalda, fiebre, Tree surgeon estomacal (nuseas) o vmitos, o si los sntomas no mejoran en 3das. Vistelo antes si empeora. SOLICITE ATENCIN MDICA  DE INMEDIATO SI:   Tiene fiebre persistente con una temperatura de 101,39F (38,8C) o ms.  Presenta muchos vmitos y no Neurosurgeon.  Siente un dolor intenso en la espalda o el abdomen incluso tomando los medicamentos.  Elimina cogulos grandes o sangre con la [place].  Se siente extremadamente dbil, se desmaya o pierde el conocimiento. ASEGRESE DE QUE:   Comprende estas instrucciones.  Controlar su afeccin.  Recibir ayuda de inmediato si no mejora o si empeora. Document Released: 01/14/2005 Document Revised: 11/04/2012 [facility] Patient Information 2014 [place], [place]. Clculos renales (Kidney Stones) Los clculos renales (urolitiasis) son masas slidas que se forman en el interior de los riones. El dolor intenso es causado por el movimiento de la piedra a travs del tracto urinario. Cuando la piedra se mueve, el urter hace un espasmo alrededor de la misma. El clculo generalmente se elimina con la [place].  CAUSAS   Un trastorno que hace que ciertas glndulas del cuello produzcan demasiada hormona paratiroidea (hiperparatiroidismo primario).  Una acumulacin de cristales de cido rico, similar a Psychiatric nurse.  Estrechamiento (constriccin) del urter.  Obstruccin en el rin presente al nacer (obstruccin congnita).  Cirugas previas del rin o los urteres.  Numerosas infecciones renales. SNTOMAS   Ganas de vomitar (nuseas).  Devolver la comida (vomitar).  Sangre en la orina (hematuria).  Dolor que generalmente se expande (irradia) hacia la ingle.  Ganas de orinar con frecuencia o de manera urgente. DIAGNSTICO   Historia clnica y examen fsico.  Anlisis de [place]  y [place].  Tomografa computada.  En algunos casos se realiza un examen del interior de la vejiga (citoscopa). TRATAMIENTO   Observacin.  Aumentar la ingesta de lquidos.  Litotricia extracorprea con ondas de choque: es un procedimiento no  invasivo que [place] ondas de choque para romper los clculos renales.  Ser necesaria la ciruga si tiene dolor muy intenso o la obstruccin persiste. Hay varios procedimientos quirrgicos. La mayora de los procedimientos se realizan con el uso de pequeos instrumentos. Slo es Chartered loss adjuster pequeas incisiones para acomodar estos instrumentos, por lo tanto el tiempo de recuperacin es mnimo. El tamao, la ubicacin y la composicin qumica de los clculos son variables importantes que determinarn la eleccin correcta de tratamiento para su caso. Comunquese con su mdico para comprender mejor su situacin, de modo que pueda [employer] de lesiones para usted y su rin.  INSTRUCCIONES PARA EL CUIDADO EN EL HOGAR   Beba gran cantidad de lquido para mantener la orina de tono claro o color amarillo plido. Esto ayudar a eliminar las piedras o los fragmentos.  Cuele la orina con el colador que le han provisto. Guarde todas las partculas y piedras para que las vea el profesional que lo asiste. Puede ser tan pequea como un grano de sal. Es muy importante usar el colador cada vez que orine. La recoleccin de piedras permitir al Medtronic y Musician que efectivamente ha eliminado una piedra. El anlisis de la piedra con frecuencia permitir identificar qu puede hacer para reducir la incidencia de las recurrencias.  Slo tome medicamentos de venta libre o recetados para Glass blower/designer, Health and safety inspector o bajar la fiebre, segn las indicaciones de su mdico.  Cumpla con las citas de seguimiento tal como le indic el profesional que lo asiste.  Si se lo indica, hgase radiografas. La ausencia de dolor no siempre significa que las piedras se han eliminado. Puede ser que simplemente hayan dejado de moverse. Si el paso de orina permanece completamente obstruido, puede causar prdida de la funcin renal o simplemente la destruccin del rin. Es su responsabilidad Artist  seguimiento y las radiografas. Las ecografas del rin pueden mostrar una obstruccin y el estado del rin. Las ecografas no se asocian con la radiacin y pueden realizarse fcilmente en cuestin de minutos. SOLICITE ATENCIN MDICA SI:  Siente dolor que no responde a los Recruitment consultant. SOLICITE ATENCIN MDICA DE INMEDIATO SI:   No puede controlar el dolor con los medicamentos que le han recetado.  Siente escalofros o fiebre.  La gravedad o la intensidad del dolor aumenta durante 18 horas y no se Engineer, production con los analgsicos.  Presenta un nuevo episodio de dolor abdominal.  Sufre mareos o se desmaya.  No puede orinar. ASEGRESE DE QUE:   Comprende estas instrucciones.  Controlar su afeccin.  Recibir ayuda de inmediato si no mejora o si empeora. Document Released: 01/14/2005 Document Revised: 09/16/2012 [facility] Patient Information 2014 [place], [place].

## 2013-04-23 LAB — URINE CULTURE
Colony Count: NO GROWTH
Organism ID, Bacteria: NO GROWTH

## 2013-04-26 ENCOUNTER — Telehealth: Payer: Self-pay | Admitting: *Deleted

## 2013-04-26 NOTE — Telephone Encounter (Signed)
Appointment scheduled on 05/06/13 with Dr.Nesi notes faxed claudia will inform patient.

## 2013-04-26 NOTE — Telephone Encounter (Signed)
Message copied by Thamas Jaegers on Mon Apr 26, 2013  9:58 AM ------      Message from: Terrance Mass      Created: Thu Apr 22, 2013  4:41 PM       Anderson Malta or [name], Please make appointment with urologist for next week. Persistent  hematuria. Historyof left kidney stones. Also with [facility]. ------

## 2013-04-27 ENCOUNTER — Encounter: Payer: Self-pay | Admitting: Gynecology

## 2013-05-19 ENCOUNTER — Other Ambulatory Visit: Payer: Self-pay | Admitting: Gynecology

## 2013-05-19 ENCOUNTER — Telehealth: Payer: Self-pay | Admitting: *Deleted

## 2013-05-19 MED ORDER — MEPERIDINE HCL 50 MG PO TABS: ORAL_TABLET | ORAL | Status: DC

## 2013-05-19 NOTE — Telephone Encounter (Signed)
See below same issue from OV 04/22/13.

## 2013-05-19 NOTE — Telephone Encounter (Signed)
Did she follow up with the urologist as we had recommended? Will prescribe refill for demerol x 1 only

## 2013-05-19 NOTE — Telephone Encounter (Signed)
Patient said she has seen Dr. Janice Norrie. She has appt to see you on May 5th to discuss her visit with Dr. Janice Norrie and what he recommended.  Printed/signed Rx was provided for patient. She understands this is last time Dr. Moshe Salisbury to prescribe it.

## 2013-05-19 NOTE — Telephone Encounter (Signed)
Message copied by Thamas Jaegers on Wed May 19, 2013 12:38 PM ------      Message from: Sinclair Grooms      Created: Wed May 19, 2013 12:30 PM      Regarding: RX       Patient is still having pain. She is schedule to see JF on  May 5. She wants to know if JF can refill meperidine (DEMEROL) 50 MG tablet.       Thx                ------

## 2013-05-26 ENCOUNTER — Inpatient Hospital Stay (HOSPITAL_COMMUNITY)
Admission: EM | Admit: 2013-05-26 | Discharge: 2013-05-30 | DRG: 694 | Disposition: A | Discharge status: Home / Self Care | Payer: No Typology Code available for payment source | Attending: Internal Medicine | Admitting: Internal Medicine

## 2013-05-26 ENCOUNTER — Encounter: Payer: Self-pay | Admitting: Gynecology

## 2013-05-26 ENCOUNTER — Emergency Department (HOSPITAL_COMMUNITY): Payer: No Typology Code available for payment source

## 2013-05-26 ENCOUNTER — Other Ambulatory Visit: Payer: Self-pay | Admitting: Gynecology

## 2013-05-26 ENCOUNTER — Ambulatory Visit (INDEPENDENT_AMBULATORY_CARE_PROVIDER_SITE_OTHER): Payer: No Typology Code available for payment source | Admitting: Gynecology

## 2013-05-26 ENCOUNTER — Encounter (HOSPITAL_COMMUNITY): Payer: Self-pay | Admitting: Emergency Medicine

## 2013-05-26 ENCOUNTER — Ambulatory Visit (INDEPENDENT_AMBULATORY_CARE_PROVIDER_SITE_OTHER): Payer: No Typology Code available for payment source

## 2013-05-26 VITALS — BP 128/84

## 2013-05-26 DIAGNOSIS — R319 Hematuria, unspecified: Secondary | ICD-10-CM

## 2013-05-26 DIAGNOSIS — R1032 Left lower quadrant pain: Secondary | ICD-10-CM

## 2013-05-26 DIAGNOSIS — N760 Acute vaginitis: Secondary | ICD-10-CM | POA: Diagnosis present

## 2013-05-26 DIAGNOSIS — N8300 Follicular cyst of ovary, unspecified side: Secondary | ICD-10-CM

## 2013-05-26 DIAGNOSIS — A499 Bacterial infection, unspecified: Secondary | ICD-10-CM | POA: Diagnosis present

## 2013-05-26 DIAGNOSIS — N393 Stress incontinence (female) (male): Secondary | ICD-10-CM

## 2013-05-26 DIAGNOSIS — Z8249 Family history of ischemic heart disease and other diseases of the circulatory system: Secondary | ICD-10-CM

## 2013-05-26 DIAGNOSIS — Z87442 Personal history of urinary calculi: Secondary | ICD-10-CM

## 2013-05-26 DIAGNOSIS — R109 Unspecified abdominal pain: Secondary | ICD-10-CM

## 2013-05-26 DIAGNOSIS — N200 Calculus of kidney: Principal | ICD-10-CM

## 2013-05-26 DIAGNOSIS — Z90710 Acquired absence of both cervix and uterus: Secondary | ICD-10-CM

## 2013-05-26 DIAGNOSIS — N898 Other specified noninflammatory disorders of vagina: Secondary | ICD-10-CM

## 2013-05-26 DIAGNOSIS — I10 Essential (primary) hypertension: Secondary | ICD-10-CM | POA: Diagnosis present

## 2013-05-26 DIAGNOSIS — B9689 Other specified bacterial agents as the cause of diseases classified elsewhere: Secondary | ICD-10-CM

## 2013-05-26 DIAGNOSIS — F4310 Post-traumatic stress disorder, unspecified: Secondary | ICD-10-CM

## 2013-05-26 DIAGNOSIS — R112 Nausea with vomiting, unspecified: Secondary | ICD-10-CM

## 2013-05-26 DIAGNOSIS — M549 Dorsalgia, unspecified: Secondary | ICD-10-CM

## 2013-05-26 DIAGNOSIS — N831 Corpus luteum cyst of ovary, unspecified side: Secondary | ICD-10-CM

## 2013-05-26 DIAGNOSIS — R350 Frequency of micturition: Secondary | ICD-10-CM

## 2013-05-26 DIAGNOSIS — G8929 Other chronic pain: Secondary | ICD-10-CM | POA: Diagnosis present

## 2013-05-26 DIAGNOSIS — N3281 Overactive bladder: Secondary | ICD-10-CM

## 2013-05-26 DIAGNOSIS — K828 Other specified diseases of gallbladder: Secondary | ICD-10-CM

## 2013-05-26 DIAGNOSIS — F079 Unspecified personality and behavioral disorder due to known physiological condition: Secondary | ICD-10-CM

## 2013-05-26 DIAGNOSIS — M542 Cervicalgia: Secondary | ICD-10-CM | POA: Diagnosis present

## 2013-05-26 DIAGNOSIS — R1115 Cyclical vomiting syndrome unrelated to migraine: Secondary | ICD-10-CM

## 2013-05-26 DIAGNOSIS — F419 Anxiety disorder, unspecified: Secondary | ICD-10-CM

## 2013-05-26 LAB — URINALYSIS, ROUTINE W REFLEX MICROSCOPIC
Bilirubin Urine: NEGATIVE
GLUCOSE, UA: NEGATIVE mg/dL
KETONES UR: NEGATIVE mg/dL
Leukocytes, UA: NEGATIVE
Nitrite: NEGATIVE
PROTEIN: NEGATIVE mg/dL
Specific Gravity, Urine: 1.022 (ref 1.005–1.030)
UROBILINOGEN UA: 0.2 mg/dL (ref 0.0–1.0)
pH: 5 (ref 5.0–8.0)

## 2013-05-26 LAB — URINALYSIS W MICROSCOPIC + REFLEX CULTURE
Bilirubin Urine: NEGATIVE
Casts: NONE SEEN
Crystals: NONE SEEN
GLUCOSE, UA: NEGATIVE mg/dL
Ketones, ur: NEGATIVE mg/dL
LEUKOCYTES UA: NEGATIVE
NITRITE: NEGATIVE
PH: 5 (ref 5.0–8.0)
Specific Gravity, Urine: 1.03 (ref 1.005–1.030)
Urobilinogen, UA: 0.2 mg/dL (ref 0.0–1.0)
WBC UA: NONE SEEN WBC/hpf (ref <3)

## 2013-05-26 LAB — BASIC METABOLIC PANEL
BUN: 13 mg/dL (ref 6–23)
CALCIUM: 9.5 mg/dL (ref 8.4–10.5)
CHLORIDE: 102 meq/L (ref 96–112)
CO2: 23 meq/L (ref 19–32)
CREATININE: 0.72 mg/dL (ref 0.50–1.10)
GFR calc Af Amer: >90 mL/min (ref >90)
GFR calc non Af Amer: >90 mL/min (ref >90)
Glucose, Bld: 97 mg/dL (ref 70–99)
Potassium: 3.6 mEq/L — ABNORMAL LOW (ref 3.7–5.3)
Sodium: 138 mEq/L (ref 137–147)

## 2013-05-26 LAB — CBC WITH DIFFERENTIAL/PLATELET
BASOS PCT: 1 % (ref 0–1)
Basophils Absolute: 0.1 10*3/uL (ref 0.0–0.1)
EOS ABS: 0 10*3/uL (ref 0.0–0.7)
EOS PCT: 0 % (ref 0–5)
HCT: 44.1 % (ref 36.0–46.0)
Hemoglobin: 14.7 g/dL (ref 12.0–15.0)
LYMPHS ABS: 2.3 10*3/uL (ref 0.7–4.0)
Lymphocytes Relative: 26 % (ref 12–46)
MCH: 31.9 pg (ref 26.0–34.0)
MCHC: 33.3 g/dL (ref 30.0–36.0)
MCV: 95.7 fL (ref 78.0–100.0)
Monocytes Absolute: 0.8 10*3/uL (ref 0.1–1.0)
Monocytes Relative: 9 % (ref 3–12)
NEUTROS PCT: 64 % (ref 43–77)
Neutro Abs: 5.6 10*3/uL (ref 1.7–7.7)
Platelets: 329 10*3/uL (ref 150–400)
RBC: 4.61 MIL/uL (ref 3.87–5.11)
RDW: 13.4 % (ref 11.5–15.5)
WBC: 8.7 10*3/uL (ref 4.0–10.5)

## 2013-05-26 LAB — WET PREP FOR TRICH, YEAST, CLUE
TRICH WET PREP: NONE SEEN
YEAST WET PREP: NONE SEEN

## 2013-05-26 LAB — URINE MICROSCOPIC-ADD ON

## 2013-05-26 LAB — LACTIC ACID, PLASMA: LACTIC ACID, VENOUS: 1 mmol/L (ref 0.5–2.2)

## 2013-05-26 MED ORDER — ONDANSETRON 4 MG PO TBDP
4.0000 mg | ORAL_TABLET | Freq: Once | ORAL | Status: AC
  Administered 2013-05-26: 4 mg via ORAL
  Filled 2013-05-26: qty 1

## 2013-05-26 MED ORDER — PROMETHAZINE HCL 25 MG/ML IJ SOLN
25.0000 mg | Freq: Once | INTRAMUSCULAR | Status: AC
  Administered 2013-05-26: 25 mg via INTRAMUSCULAR
  Filled 2013-05-26: qty 1

## 2013-05-26 MED ORDER — ONDANSETRON HCL 4 MG/2ML IJ SOLN
4.0000 mg | Freq: Once | INTRAMUSCULAR | Status: AC
  Administered 2013-05-26: 4 mg via INTRAVENOUS
  Filled 2013-05-26: qty 2

## 2013-05-26 MED ORDER — MORPHINE SULFATE 4 MG/ML IJ SOLN
4.0000 mg | Freq: Once | INTRAMUSCULAR | Status: AC
  Administered 2013-05-26: 4 mg via INTRAVENOUS
  Filled 2013-05-26: qty 1

## 2013-05-26 MED ORDER — CEPHALEXIN 500 MG PO CAPS
500.0000 mg | ORAL_CAPSULE | Freq: Once | ORAL | Status: AC
  Administered 2013-05-26: 500 mg via ORAL
  Filled 2013-05-26: qty 1

## 2013-05-26 MED ORDER — SODIUM CHLORIDE 0.9 % IV SOLN
INTRAVENOUS | Status: DC
  Administered 2013-05-26: 19:00:00 via INTRAVENOUS

## 2013-05-26 MED ORDER — MORPHINE SULFATE 4 MG/ML IJ SOLN
4.0000 mg | Freq: Once | INTRAMUSCULAR | Status: AC
Start: 2013-05-26 — End: 2013-05-26
  Administered 2013-05-26: 4 mg via INTRAVENOUS
  Filled 2013-05-26: qty 1

## 2013-05-26 MED ORDER — PROCHLORPERAZINE MALEATE 10 MG PO TABS
10.0000 mg | ORAL_TABLET | Freq: Once | ORAL | Status: AC
  Administered 2013-05-26: 10 mg via ORAL
  Filled 2013-05-26: qty 1

## 2013-05-26 MED ORDER — SODIUM CHLORIDE 0.9 % IV BOLUS (SEPSIS)
1000.0000 mL | Freq: Once | INTRAVENOUS | Status: AC
  Administered 2013-05-27: 1000 mL via INTRAVENOUS

## 2013-05-26 MED ORDER — LORAZEPAM 2 MG/ML IJ SOLN
1.0000 mg | Freq: Once | INTRAMUSCULAR | Status: AC
  Administered 2013-05-26: 1 mg via INTRAVENOUS
  Filled 2013-05-26: qty 1

## 2013-05-26 NOTE — ED Notes (Signed)
Per pt, has had pain for over a month.  Pt describes pain in left flank.  Pt states today n/v and difficulty urinating with blood in urine.  Pt states Dr. Janice Norrie evaluated patient and although had stones, was told to follow up with Gynecologist regarding pain.  Pt has appt next week with gynecologist.

## 2013-05-26 NOTE — Progress Notes (Signed)
   Patient is a 43 year old presented to the office today complaining of frequent urination along with blood in her urine along with pelvic pressure and left lower back discomfort and slight vaginal discharge. Patient denied any fever chills some nausea but no vomiting. Patient was seen in the office on 04/22/2013 with similar complaints and had also been seen last year by the emergency room with what appeared to be passage of kidney stones. Patient had a negative urine culture on March 26. She had had complaints of stress urinary incontinence and had a normal Q-tip angle test of less than 30. Patient in the past had a history of a total abdominal hysterectomy with right salpingo-oophorectomy secondary to dysmenorrhea, menorrhagia, endometrial polyps and chronic right lower quadrant pain and endometriosis in 2009.  Patient was subsequently referred to the urologist because of high suspicion of  recurrent nephrolithiasis. She saw Dr. Kellie Simmering on April 9 and a CT was done and she has informed me that she was told that she had several kidney stones on her left. She was instructed to follow up with me her gynecologist to make sure there was nothing GYN related and that then he would see her back again.   Exam: Back: Positive left CVA tenderness Abdomen: Tenderness noted in the left lower abdomen with no rebound or guarding Pelvic: Bartholin urethra Skene movements within normal limits Vagina: Clear discharge noted vaginal cuff intact wet prep obtained Bimanual exam: Tenderness in the left lower quadrant Rectal exam: Not done  Wet prep: Too numerous to count RBC rare bacteria culture submitted  Wet prep: Pos Amine, moderate clue cell, too numerous to count bacteria  Ultrasound today: Absent uterus and right adnexa. Left ovary with a small thin echo free follicle measuring 27 x 21 x 21 mm avascular. No apparent masses seen on right or left adnexa. Good O2 blood flow to the left ovary. Transvaginal and  transabdominal scan is done.  Assessment/plan: Patient with recurrent left nephrolithiasis with worsening symptoms over the past month. Hematuria was present along with flank pain on her left and no fever. Patient will be sent to the emergency department since it is after hours so that the urologist may be contacted for treatment. Ultrasound was sent left ovarian follicle not related to symptoms.   I will recommend my ED colleague to inform her that she had a mild vaginal infection called BV and I will call in a prescription for Flagyl 500 mg 1 by mouth twice a day for 5 days as she can pick up at the pharmacy.

## 2013-05-26 NOTE — ED Provider Notes (Signed)
CSN: [id number]     Arrival date & time 05/26/13  1734 History   First MD Initiated Contact with Patient 05/26/13 1814     Chief Complaint  Patient presents with  . Flank Pain     (Consider location/radiation/quality/duration/timing/severity/associated sxs/prior Treatment) HPI  [name] is a 43 y.o. female complaining of left flank pain radiating down to the left lower quadrant and legft flank pain  worsening over the course of 4 weeks. Patient states that she saw urology, Dr. Janice Norrie several weeks ago and was told she had kidney stones after CT scan. Patient states that the pain is getting significantly worse over last 24 hours associated with headache, several episodes of nonbloody, nonbilious, non-coffee-ground emesis and fever with MAXIMUM TEMPERATURE of 104.2 this a.m. Patient took Tylenol at 74 AM. Patient endorses, sensation of incomplete voiding,  dysuria described as sharp and also hematuria. Denies chest pain, shortness of breath. Reports that her stool has an unusual shaped in that it is flat over the last few days. Patient had evaluation at OB/GYN 2 times over the last month with no significant findings. Sent to ED by Neill Loft  Past Medical History  Diagnosis Date  . Dysrhythmia     not followed by cardiogist  . Anemia   . Blood transfusion   . Seizures   . Headache(784.0)   . Depression   . Abdominal pain     takes omeprazole  . Hypertension     dr Huey Bienenstock  . Chronic back pain greater than 3 months duration   . Chronic neck pain   . Kidney stones    Past Surgical History  Procedure Laterality Date  . Abdominal hysterectomy    . Cesarean section      x2  . Abdominoplasty    . Cervical spine surgery      2002  . Shoulder surgery      left humeral head replacement 2001  . Breast surgery      breast implants x 2  . Cholecystectomy  02/28/2011    Procedure: LAPAROSCOPIC CHOLECYSTECTOMY;  Surgeon: Joyice Faster. Cornett, MD;  Location: [place];  Service:  General;  Laterality: N/A;  Laparoscopic cholecystectomy.  . Ventral hernia repair  08/22/2011    Procedure: LAPAROSCOPIC VENTRAL HERNIA;  Surgeon: Joyice Faster. Cornett, MD;  Location: [facility];  Service: General;  Laterality: N/A;  . Hernia repair  08/22/11    Ventral  . Back surgery     Family History  Problem Relation Age of Onset  . Cancer Mother     colon, skin  . Anesthesia problems Mother   . Heart disease Father    History  Substance Use Topics  . Smoking status: Never Smoker   . Smokeless tobacco: Never Used  . Alcohol Use: No   OB History   Grav Para Term Preterm Abortions TAB SAB Ect Mult Living   0              Review of Systems  10 systems reviewed and found to be negative, except as noted in the HPI.    Allergies  Latex and Naproxen  Home Medications   Prior to Admission medications   Medication Sig Start Date End Date Taking? Authorizing Provider  acyclovir cream (ZOVIRAX) 5 % Apply 1 application topically every 3 (three) hours. 04/22/13   Terrance Mass, MD  amitriptyline (ELAVIL) 150 MG tablet Take 150 mg by mouth at bedtime.    Historical Provider, MD  gabapentin (  NEURONTIN) 600 MG tablet Take 600 mg by mouth 3 (three) times daily.    Historical Provider, MD  lisinopril-hydrochlorothiazide (PRINZIDE,ZESTORETIC) 20-25 MG per tablet Take 1 tablet by mouth daily.    Historical Provider, MD  LORazepam (ATIVAN) 1 MG tablet Take 1 tablet (1 mg total) by mouth 3 (three) times daily as needed for anxiety. 02/16/13   Kristen N Ward, DO  meperidine (DEMEROL) 50 MG tablet One PO TID prn 05/19/13   Terrance Mass, MD  nitrofurantoin, macrocrystal-monohydrate, (MACROBID) 100 MG capsule Take 1 capsule (100 mg total) by mouth 2 (two) times daily. 04/15/13   Terrance Mass, MD  phenazopyridine (PYRIDIUM) 200 MG tablet Take 1 tablet (200 mg total) by mouth 3 (three) times daily with meals. 04/15/13   Terrance Mass, MD   BP 137/90  Pulse 100  Temp(Src) 98.2 F (36.8 C)  (Oral)  Resp 18  SpO2 100% Physical Exam  Nursing note and vitals reviewed. Constitutional: She is oriented to person, place, and time. She appears well-developed and well-nourished.  Appears uncomfortable  HENT:  Head: Normocephalic and atraumatic.  Mouth/Throat: Oropharynx is clear and moist.  Eyes: Conjunctivae and EOM are normal. Pupils are equal, round, and reactive to light.  Neck: Normal range of motion.  Cardiovascular: Normal rate, regular rhythm and intact distal pulses.   Pulmonary/Chest: Effort normal and breath sounds normal. No stridor. No respiratory distress. She has no wheezes. She has no rales. She exhibits no tenderness.  Abdominal: Soft. Bowel sounds are normal. She exhibits no distension and no mass. There is tenderness. There is no rebound and no guarding.  LLQ TTP with no guarding or rebound  Genitourinary:  Left sided CVA TTP  Musculoskeletal: Normal range of motion. She exhibits no edema and no tenderness.  Neurological: She is alert and oriented to person, place, and time.  Skin: Skin is warm.  Psychiatric: She has a normal mood and affect.    ED Course  Procedures (including critical care time) Labs Review Labs Reviewed  URINALYSIS, ROUTINE W REFLEX MICROSCOPIC - Abnormal; Notable for the following:    APPearance CLOUDY (*)    Hgb urine dipstick LARGE (*)    All other components within normal limits  BASIC METABOLIC PANEL - Abnormal; Notable for the following:    Potassium 3.6 (*)    All other components within normal limits  URINE MICROSCOPIC-ADD ON - Abnormal; Notable for the following:    Squamous Epithelial / LPF FEW (*)    Bacteria, UA FEW (*)    All other components within normal limits  URINE CULTURE  CBC WITH DIFFERENTIAL  LACTIC ACID, PLASMA    Imaging Review Ct Abdomen Pelvis Wo Contrast  05/26/2013   CLINICAL DATA:  Left-sided flank pain.  History of kidney stones.  EXAM: CT ABDOMEN AND PELVIS WITHOUT CONTRAST  TECHNIQUE:  Multidetector CT imaging of the abdomen and pelvis was performed following the standard protocol without intravenous contrast.  COMPARISON:  06/02/2011  FINDINGS: There is no pleural or pericardial effusion identified. The lung bases are clear.  No focal liver abnormality identified. The patient is status post coli cystectomy. No biliary dilatation. Normal appearance of the pancreas. The spleen is unremarkable.  The adrenal glands are both normal. The right kidney appears normal. There is no right-sided hydronephrosis or hydroureter. No right ureteral lithiasis or hydroureter. Stone within the upper pole of the left kidney is unchanged measuring 4 mm. There is no left-sided hydronephrosis or hydroureter. No left-sided ureteral lithiasis  or hydroureter. The urinary bladder appears normal. Prior hysterectomy. Left ovary remains. This appears slightly enlarged when compared with study from 06/02/2011. This may reflect presence of underlying hemorrhagic or physiologic cyst.  Normal caliber of the abdominal aorta. No aneurysm. There is no pathologically enlarged upper abdominal lymph nodes. No enlarged pelvic or inguinal lymph nodes. There is no free fluid or fluid collections within the abdomen or pelvis.  The stomach is normal. The small bowel loops have a normal course and caliber without evidence for bowel obstruction. The appendix is normal. Normal appearance of the colon.  Review of the visualized bony structures is negative.  IMPRESSION: 1. Nonobstructing left renal calculus is similar to previous exam. No hydronephrosis or ureteral calculus identified. 2. Previous hysterectomy. The left ovary remains and appears slightly larger than on previous examination.   Electronically Signed   By: Kerby Moors M.D.   On: 05/26/2013 19:27     EKG Interpretation None      MDM   Final diagnoses:  Hematuria  Left flank pain  Emesis, persistent    Filed Vitals:   05/26/13 1753 05/26/13 2039  BP: 137/90 134/71   Pulse: 100 80  Temp: 98.2 F (36.8 C) 98.2 F (36.8 C)  TempSrc: Oral Oral  Resp: 18 20  SpO2: 100% 100%    Medications  0.9 %  sodium chloride infusion ( Intravenous Stopped 05/26/13 2104)  sodium chloride 0.9 % bolus 1,000 mL (not administered)  promethazine (PHENERGAN) injection 25 mg (not administered)  morphine 4 MG/ML injection 4 mg (4 mg Intravenous Given 05/26/13 1900)  ondansetron (ZOFRAN-ODT) disintegrating tablet 4 mg (4 mg Oral Given 05/26/13 1900)  cephALEXin (KEFLEX) capsule 500 mg (500 mg Oral Given 05/26/13 2104)  morphine 4 MG/ML injection 4 mg (4 mg Intravenous Given 05/26/13 2125)  ondansetron (ZOFRAN) injection 4 mg (4 mg Intravenous Given 05/26/13 2125)  prochlorperazine (COMPAZINE) tablet 10 mg (10 mg Oral Given 05/26/13 2243)  LORazepam (ATIVAN) injection 1 mg (1 mg Intravenous Given 05/26/13 2251)    [name] is a 43 y.o. female presenting with left flank pain fever and vomiting.  CAT scan ordered based on concern for infected stone considering history of fever  Serial abd exams remain non-surgical. Patient afebrile in the ED, blood work reassuring, UA with hematuria which patient reports she's had for over a month.  [place] has has veiwed the ctr reccomends pt has has hemuria ct and cystoscopy  Patient has vomited multiple times after several doses of Zofran and Ativan: Will need admission for intractable emesis.  Pt will be admitted to Dr. Arnoldo Morale for obs.   Note: Portions of this report may have been transcribed using voice recognition software. Every effort was made to ensure accuracy; however, inadvertent computerized transcription errors may be present     Monico Blitz, PA-C 05/26/13 2346

## 2013-05-26 NOTE — ED Notes (Signed)
Pt states she threw up

## 2013-05-26 NOTE — ED Notes (Signed)
Pt taken ginger ale and encouraged to drink slowly.

## 2013-05-27 ENCOUNTER — Telehealth: Payer: Self-pay | Admitting: *Deleted

## 2013-05-27 DIAGNOSIS — R109 Unspecified abdominal pain: Secondary | ICD-10-CM

## 2013-05-27 DIAGNOSIS — R112 Nausea with vomiting, unspecified: Secondary | ICD-10-CM | POA: Diagnosis present

## 2013-05-27 DIAGNOSIS — R319 Hematuria, unspecified: Secondary | ICD-10-CM

## 2013-05-27 DIAGNOSIS — R1115 Cyclical vomiting syndrome unrelated to migraine: Secondary | ICD-10-CM

## 2013-05-27 DIAGNOSIS — A499 Bacterial infection, unspecified: Secondary | ICD-10-CM

## 2013-05-27 DIAGNOSIS — B9689 Other specified bacterial agents as the cause of diseases classified elsewhere: Secondary | ICD-10-CM

## 2013-05-27 DIAGNOSIS — N200 Calculus of kidney: Principal | ICD-10-CM

## 2013-05-27 DIAGNOSIS — N760 Acute vaginitis: Secondary | ICD-10-CM

## 2013-05-27 LAB — CBC
HCT: 39.4 % (ref 36.0–46.0)
Hemoglobin: 12.9 g/dL (ref 12.0–15.0)
MCH: 31.3 pg (ref 26.0–34.0)
MCHC: 32.7 g/dL (ref 30.0–36.0)
MCV: 95.6 fL (ref 78.0–100.0)
Platelets: 272 10*3/uL (ref 150–400)
RBC: 4.12 MIL/uL (ref 3.87–5.11)
RDW: 13.5 % (ref 11.5–15.5)
WBC: 6.6 10*3/uL (ref 4.0–10.5)

## 2013-05-27 LAB — BASIC METABOLIC PANEL
BUN: 10 mg/dL (ref 6–23)
CALCIUM: 8.6 mg/dL (ref 8.4–10.5)
CO2: 22 mEq/L (ref 19–32)
Chloride: 106 mEq/L (ref 96–112)
Creatinine, Ser: 0.67 mg/dL (ref 0.50–1.10)
Glucose, Bld: 98 mg/dL (ref 70–99)
Potassium: 4.1 mEq/L (ref 3.7–5.3)
Sodium: 138 mEq/L (ref 137–147)

## 2013-05-27 LAB — URINE CULTURE
COLONY COUNT: NO GROWTH
Organism ID, Bacteria: NO GROWTH

## 2013-05-27 MED ORDER — LORAZEPAM 2 MG/ML IJ SOLN: 0.5000 mg | Freq: Four times a day (QID) | INTRAMUSCULAR | Status: DC | PRN

## 2013-05-27 MED ORDER — ONDANSETRON HCL 4 MG PO TABS
4.0000 mg | ORAL_TABLET | Freq: Four times a day (QID) | ORAL | Status: DC | PRN
  Administered 2013-05-28 – 2013-05-30 (×2): 4 mg via ORAL
  Filled 2013-05-27 (×2): qty 1

## 2013-05-27 MED ORDER — DIPHENHYDRAMINE HCL 25 MG PO CAPS
25.0000 mg | ORAL_CAPSULE | Freq: Four times a day (QID) | ORAL | Status: DC | PRN
  Administered 2013-05-27 – 2013-05-28 (×4): 25 mg via ORAL
  Filled 2013-05-27 (×4): qty 1

## 2013-05-27 MED ORDER — CEFTRIAXONE SODIUM 1 G IJ SOLR
1.0000 g | INTRAMUSCULAR | Status: DC
  Administered 2013-05-27 – 2013-05-29 (×3): 1 g via INTRAVENOUS
  Filled 2013-05-27 (×4): qty 10

## 2013-05-27 MED ORDER — ACETAMINOPHEN 650 MG RE SUPP: 650.0000 mg | Freq: Four times a day (QID) | RECTAL | Status: DC | PRN

## 2013-05-27 MED ORDER — HYDROMORPHONE HCL PF 1 MG/ML IJ SOLN
1.0000 mg | INTRAMUSCULAR | Status: DC | PRN
  Administered 2013-05-27 – 2013-05-29 (×8): 1 mg via INTRAVENOUS
  Filled 2013-05-27 (×8): qty 1

## 2013-05-27 MED ORDER — HYDROMORPHONE HCL PF 1 MG/ML IJ SOLN
0.5000 mg | INTRAMUSCULAR | Status: DC | PRN
  Administered 2013-05-27: 0.5 mg via INTRAVENOUS
  Administered 2013-05-27 (×2): 1 mg via INTRAVENOUS
  Filled 2013-05-27 (×3): qty 1

## 2013-05-27 MED ORDER — ALUM & MAG HYDROXIDE-SIMETH 200-200-20 MG/5ML PO SUSP
30.0000 mL | Freq: Four times a day (QID) | ORAL | Status: DC | PRN
  Administered 2013-05-27: 30 mL via ORAL
  Filled 2013-05-27: qty 30

## 2013-05-27 MED ORDER — ENOXAPARIN SODIUM 40 MG/0.4ML ~~LOC~~ SOLN
40.0000 mg | Freq: Every day | SUBCUTANEOUS | Status: DC
  Administered 2013-05-27 – 2013-05-30 (×4): 40 mg via SUBCUTANEOUS
  Filled 2013-05-27 (×4): qty 0.4

## 2013-05-27 MED ORDER — TAMSULOSIN HCL 0.4 MG PO CAPS
0.4000 mg | ORAL_CAPSULE | Freq: Every day | ORAL | Status: DC
  Administered 2013-05-27 – 2013-05-30 (×4): 0.4 mg via ORAL
  Filled 2013-05-27 (×4): qty 1

## 2013-05-27 MED ORDER — ONDANSETRON HCL 4 MG/2ML IJ SOLN
4.0000 mg | Freq: Four times a day (QID) | INTRAMUSCULAR | Status: DC | PRN
  Administered 2013-05-27 – 2013-05-28 (×4): 4 mg via INTRAVENOUS
  Filled 2013-05-27 (×4): qty 2

## 2013-05-27 MED ORDER — SODIUM CHLORIDE 0.9 % IV SOLN
INTRAVENOUS | Status: DC
  Administered 2013-05-27: 01:00:00 via INTRAVENOUS

## 2013-05-27 MED ORDER — SODIUM CHLORIDE 0.9 % IV SOLN
INTRAVENOUS | Status: DC
  Administered 2013-05-27 – 2013-05-28 (×2): via INTRAVENOUS
  Administered 2013-05-28: 100 mL/h via INTRAVENOUS
  Administered 2013-05-29 (×2): via INTRAVENOUS

## 2013-05-27 MED ORDER — PROMETHAZINE HCL 25 MG/ML IJ SOLN
12.5000 mg | Freq: Four times a day (QID) | INTRAMUSCULAR | Status: DC | PRN
  Administered 2013-05-27 – 2013-05-29 (×5): 12.5 mg via INTRAVENOUS
  Filled 2013-05-27 (×6): qty 1

## 2013-05-27 MED ORDER — ACETAMINOPHEN 325 MG PO TABS
650.0000 mg | ORAL_TABLET | Freq: Four times a day (QID) | ORAL | Status: DC | PRN
  Administered 2013-05-27 – 2013-05-30 (×4): 650 mg via ORAL
  Filled 2013-05-27 (×4): qty 2

## 2013-05-27 MED ORDER — OXYCODONE HCL 5 MG PO TABS
5.0000 mg | ORAL_TABLET | ORAL | Status: DC | PRN
  Administered 2013-05-27 – 2013-05-30 (×9): 5 mg via ORAL
  Filled 2013-05-27 (×9): qty 1

## 2013-05-27 NOTE — Progress Notes (Signed)
Patient ID: [name], female   DOB: 1970/04/04, 43 y.o.   MRN: [id number]  [facility] PROGRESS NOTE  [name] URK:[id number] DOB: 12-15-1970 DOA: 05/26/2013 PCP: Ginger Organ, MD  Brief narrative: 43 y.o. female presented to [facility] ED with main concern of persistent N/V, poor oral intake several days in duration with no specific alleviating factors. This has been associated with left lower quadrant abd pain, throbbing and constant, 5/10 in severity and radiating to the left flank area. She denies fevers, chills, chest pain or shortness of breath.  Principal Problem:   Intractable nausea and vomiting - this appears to be secondary to nephrolithiasis - keep on clear liquid duet as we have tried to advance but pt still vomiting  - continue analgesia and antiemetics as needed - keep on IVF Active Problems:   Hematuria - unclear etiology  - will ask urology for further input    Left nephrolithiasis - continue IVF, empiric ABX until urine culture is back - analgesia as needed  Consultants:  None  Procedures/Studies: Ct Abdomen Pelvis Wo Contrast  05/26/2013  Nonobstructing left renal calculus is similar to previous exam. No hydronephrosis or ureteral calculus identified. Previous hysterectomy. The left ovary remains and appears slightly larger than on previous examination.     Antibiotics:  Rocephin 4/30 -->   Code Status: Full Family Communication: Pt at bedside Disposition Plan: Home when medically stable  HPI/Subjective: No events overnight.   Objective: Filed Vitals:   05/27/13 0006 05/27/13 0051 05/27/13 0543 05/27/13 1350  BP: 131/81 140/88 117/72 120/66  Pulse: 104 101 93 93  Temp: 98 F (36.7 C) 98.1 F (36.7 C) 98.1 F (36.7 C) 98.3 F (36.8 C)  TempSrc: Oral Oral Oral Oral  Resp: 18 20 18 16   Height:  5\' 6"  (1.676 m)    Weight:  81.647 kg (180 lb)    SpO2: 100% 100% 98% 98%    Intake/Output Summary (Last 24 hours) at  05/27/13 1428 Last data filed at 05/27/13 0700  Gross per 24 hour  Intake    480 ml  Output    300 ml  Net    180 ml    Exam:   General:  Pt is alert, follows commands appropriately, not in acute distress  Cardiovascular: Regular rate and rhythm, S1/S2, no murmurs, no rubs, no gallops  Respiratory: Clear to auscultation bilaterally, no wheezing, no crackles, no rhonchi  Abdomen: Soft, tender in lower abd quadrants L > R, non distended, bowel sounds present, no guarding  Extremities: No edema, pulses DP and PT palpable bilaterally  Neuro: Grossly nonfocal  Data Reviewed: Basic Metabolic Panel:  Recent Labs Lab 05/26/13 1809 05/27/13 0442  NA 138 138  K 3.6* 4.1  CL 102 106  CO2 23 22  GLUCOSE 97 98  BUN 13 10  CREATININE 0.72 0.67  CALCIUM 9.5 8.6   CBC:  Recent Labs Lab 05/26/13 1809 05/27/13 0442  WBC 8.7 6.6  NEUTROABS 5.6  --   HGB 14.7 12.9  HCT 44.1 39.4  MCV 95.7 95.6  PLT 329 272   Recent Results (from the past 240 hour(s))  WET PREP FOR TRICH, YEAST, CLUE     Status: Abnormal   Collection Time    05/26/13  4:28 PM      Result Value Ref Range Status   Yeast Wet Prep HPF POC NONE SEEN  NONE SEEN Final   Trich, Wet Prep NONE SEEN  NONE SEEN Final   Clue Cells  Wet Prep HPF POC MOD (*) NONE SEEN Final   WBC, Wet Prep HPF POC RARE  NONE SEEN Final   Comment: Bacteria - Too numerous to count     (3-6) EPITH. CELLS PER HPF     AMINE POSITIVE     Scheduled Meds: . cefTRIAXone (ROCEPHIN)  IV  1 g Intravenous Q24H  . enoxaparin (LOVENOX) injection  40 mg Subcutaneous Daily  . tamsulosin  0.4 mg Oral Daily   Continuous Infusions: . sodium chloride       Theodis Blaze, MD  [facility] Pager [phone]  If 7PM-7AM, please contact night-coverage [email or web address] Password [email or web address] 05/27/2013, 2:28 PM   LOS: 1 day

## 2013-05-27 NOTE — Telephone Encounter (Signed)
I spoke with Dr.Nesi office recent note and CT scan will be placed on your desk. Pt is scheduled for 05/31/13 @ 11:45 am follow up with Dr.Nesi

## 2013-05-27 NOTE — H&P (Signed)
[facility] History and Physical  [name] TGG:[id number] DOB: 08-Jun-1970 DOA: 05/26/2013  Referring physician: EDP PCP: Ginger Organ, MD  Specialists:   Chief Complaint: Nausea and Vomiting  HPI: [name] is a 43 y.o. female who presents to the ED with complaints of N+V X 24 hours.  She denies any hematemesis, and denies having any diarrhea or constipation.  She has not been able to hold down any foods or liquids.   She also has had left sided flank pain x 1 month and has been diagnosed with a Non-Obstructing Renal Calculus.  She had an extensive workup including cystoscopy performed by Urology Dr. Janice Norrie.   She also has seen her GYN Dr. Toney Rakes.   She reports taking oxycodone 5 mg for her Flank Pain for the past 2 weeks.   .         Review of Systems:  Constitutional: No Weight Loss, No Weight Gain, Night Sweats, Fevers, Chills, Fatigue, or Generalized Weakness HEENT: No Headaches, Difficulty Swallowing,Tooth/Dental Problems,Sore Throat,  No Sneezing, Rhinitis, Ear Ache, Nasal Congestion, or Post Nasal Drip,  Cardio-vascular:  No Chest pain, Orthopnea, PND, Edema in lower extremities, Anasarca, Dizziness, Palpitations  Resp: No Dyspnea, No DOE, No Productive Cough, No Non-Productive Cough, No Hemoptysis, No Change in Color of Mucus,  No Wheezing.    GI: No Heartburn, Indigestion, Abdominal Pain, +Nausea, +Vomiting, Diarrhea, Change in Bowel Habits,  Loss of Appetite  GU: No Dysuria, Change in Color of Urine, No Urgency or Frequency.  No flank pain.  Musculoskeletal: No Joint Pain or Swelling.  No Decreased Range of Motion. +Left lower Flank Pain,    Neurologic: No Syncope, No Seizures, Muscle Weakness, Paresthesia, Vision Disturbance or Loss, No Diplopia, No Vertigo, No Difficulty Walking,  Skin: No Rash or Lesions. Psych: No Change in Mood or Affect. No Depression or Anxiety. No Memory loss. No Confusion or Hallucinations   Past Medical History   Diagnosis Date  . Dysrhythmia     not followed by cardiogist  . Anemia   . Blood transfusion   . Seizures   . Headache(784.0)   . Depression   . Abdominal pain     takes omeprazole  . Hypertension     dr Huey Bienenstock  . Chronic back pain greater than 3 months duration   . Chronic neck pain   . Kidney stones       Past Surgical History  Procedure Laterality Date  . Abdominal hysterectomy    . Cesarean section      x2  . Abdominoplasty    . Cervical spine surgery      2002  . Shoulder surgery      left humeral head replacement 2001  . Breast surgery      breast implants x 2  . Cholecystectomy  02/28/2011    Procedure: LAPAROSCOPIC CHOLECYSTECTOMY;  Surgeon: Joyice Faster. Cornett, MD;  Location: [place];  Service: General;  Laterality: N/A;  Laparoscopic cholecystectomy.  . Ventral hernia repair  08/22/2011    Procedure: LAPAROSCOPIC VENTRAL HERNIA;  Surgeon: Joyice Faster. Cornett, MD;  Location: [place];  Service: General;  Laterality: N/A;  . Hernia repair  08/22/11    Ventral  . Back surgery         Prior to Admission medications   Medication Sig Start Date End Date Taking? Authorizing Provider  acetaminophen (TYLENOL) 325 MG tablet Take 650 mg by mouth every 6 (six) hours as needed (pain).   Yes Historical Provider, MD  acyclovir cream (ZOVIRAX) 5 % Apply 1 application topically every 3 (three) hours. 04/22/13  Yes Terrance Mass, MD  amitriptyline (ELAVIL) 150 MG tablet Take 150 mg by mouth at bedtime.   Yes Historical Provider, MD  gabapentin (NEURONTIN) 600 MG tablet Take 600 mg by mouth 3 (three) times daily.   Yes Historical Provider, MD  lisinopril-hydrochlorothiazide (PRINZIDE,ZESTORETIC) 20-25 MG per tablet Take 1 tablet by mouth daily.   Yes Historical Provider, MD  LORazepam (ATIVAN) 1 MG tablet Take 1 tablet (1 mg total) by mouth 3 (three) times daily as needed for anxiety. 02/16/13  Yes Kristen N Ward, DO  meperidine (DEMEROL) 50 MG tablet Take 50 mg by mouth every 6 (six)  hours as needed (pain). One PO TID prn 05/19/13  Yes Terrance Mass, MD      Allergies  Allergen Reactions  . Latex Anaphylaxis  . Naproxen Anaphylaxis     Social History:  reports that she has never smoked. She has never used smokeless tobacco. She reports that she does not drink alcohol or use illicit drugs.     Family History  Problem Relation Age of Onset  . Cancer Mother     colon, skin  . Anesthesia problems Mother   . Heart disease Father        Physical Exam:  GEN:  Pleasant Well Developed  43 y.o. Hispanic female  examined  and in no acute distress; cooperative with exam Filed Vitals:   05/26/13 1753 05/26/13 2039 05/27/13 0006  BP: 137/90 134/71 131/81  Pulse: 100 80 104  Temp: 98.2 F (36.8 C) 98.2 F (36.8 C) 98 F (36.7 C)  TempSrc: Oral Oral Oral  Resp: 18 20 18   SpO2: 100% 100% 100%   Blood pressure 131/81, pulse 104, temperature 98 F (36.7 C), temperature source Oral, resp. rate 18, SpO2 100.00%. PSYCH: She is alert and oriented x4; does not appear anxious does not appear depressed; affect is normal HEENT: Normocephalic and Atraumatic, Mucous membranes pink; PERRLA; EOM intact; Fundi:  Benign;  No scleral icterus, Nares: Patent, Oropharynx: Clear;  Fair Dentition, Neck:  FROM, no cervical lymphadenopathy nor thyromegaly or carotid bruit; no JVD; Breasts:: Not examined CHEST WALL: No tenderness CHEST: Normal respiration, clear to auscultation bilaterally HEART: Regular rate and rhythm; no murmurs rubs or gallops BACK: No kyphosis or scoliosis; no CVA tenderness ABDOMEN: Positive Bowel Sounds, soft non-tender; no masses, no organomegaly.   Rectal Exam: Not done EXTREMITIES: No cyanosis, clubbing or edema; no ulcerations. Genitalia: not examined PULSES: 2+ and symmetric SKIN: Norm CNS:   Alert  X Oriented x 4, No focal Deficits.    Vascular: pulses palpable throughout    Labs on Admission:  Basic Metabolic Panel:  Recent Labs Lab  05/26/13 1809  NA 138  K 3.6*  CL 102  CO2 23  GLUCOSE 97  BUN 13  CREATININE 0.72  CALCIUM 9.5   Liver Function Tests: No results found for this basename: AST, ALT, ALKPHOS, BILITOT, PROT, ALBUMIN,  in the last 168 hours No results found for this basename: LIPASE, AMYLASE,  in the last 168 hours No results found for this basename: AMMONIA,  in the last 168 hours CBC:  Recent Labs Lab 05/26/13 1809  WBC 8.7  NEUTROABS 5.6  HGB 14.7  HCT 44.1  MCV 95.7  PLT 329   Cardiac Enzymes: No results found for this basename: CKTOTAL, CKMB, CKMBINDEX, TROPONINI,  in the last 168 hours  BNP (last 3 results) No  results found for this basename: PROBNP,  in the last 8760 hours CBG: No results found for this basename: GLUCAP,  in the last 168 hours  Radiological Exams on Admission: Ct Abdomen Pelvis Wo Contrast  05/26/2013   CLINICAL DATA:  Left-sided flank pain.  History of kidney stones.  EXAM: CT ABDOMEN AND PELVIS WITHOUT CONTRAST  TECHNIQUE: Multidetector CT imaging of the abdomen and pelvis was performed following the standard protocol without intravenous contrast.  COMPARISON:  06/02/2011  FINDINGS: There is no pleural or pericardial effusion identified. The lung bases are clear.  No focal liver abnormality identified. The patient is status post coli cystectomy. No biliary dilatation. Normal appearance of the pancreas. The spleen is unremarkable.  The adrenal glands are both normal. The right kidney appears normal. There is no right-sided hydronephrosis or hydroureter. No right ureteral lithiasis or hydroureter. Stone within the upper pole of the left kidney is unchanged measuring 4 mm. There is no left-sided hydronephrosis or hydroureter. No left-sided ureteral lithiasis or hydroureter. The urinary bladder appears normal. Prior hysterectomy. Left ovary remains. This appears slightly enlarged when compared with study from 06/02/2011. This may reflect presence of underlying hemorrhagic or  physiologic cyst.  Normal caliber of the abdominal aorta. No aneurysm. There is no pathologically enlarged upper abdominal lymph nodes. No enlarged pelvic or inguinal lymph nodes. There is no free fluid or fluid collections within the abdomen or pelvis.  The stomach is normal. The small bowel loops have a normal course and caliber without evidence for bowel obstruction. The appendix is normal. Normal appearance of the colon.  Review of the visualized bony structures is negative.  IMPRESSION: 1. Nonobstructing left renal calculus is similar to previous exam. No hydronephrosis or ureteral calculus identified. 2. Previous hysterectomy. The left ovary remains and appears slightly larger than on previous examination.   Electronically Signed   By: Kerby Moors M.D.   On: 05/26/2013 19:27      Assessment/Plan:   43 y.o. female with  Principal Problem:   Intractable nausea and vomiting Active Problems:   Left nephrolithiasis   Left flank pain   Hematuria   Bacterial vaginosis    1.  Intractable N+V-   Anti-emetics PRN (Phenergan),  Clear liquid Diet and Advance as Tolerated.      2.  Left Nephrolithiasis- Non Obstructing on CT scan 04/29,   Source of Hematuria, and Pain, IVFs for hydration and Pain Control PRN.   Case was discussed with Urology on-Call for Dr. Janice Norrie by the EDP.    3.  Left Flank Pain  Due to #2.    4.  Hematuria- due to #2.     5.  Bacterial Vaginosis-  Was to start Outpt Rx of Flagyl called in by Dr. Toney Rakes on 04/29, now having N+V,  Postpone oral Rx for now until nausea and vomiting resolve.     6.  DVT prophylaxis with Lovenox,  monitor her hematuria, D/c Lovenox if needed.           Code Status:   FULL CODE Family Communication:   Husband at Bedside Disposition Plan:       Observation  Time spent:  [facility] Pager [phone]  If 7PM-7AM, please contact night-coverage [email or web address] Password [email or web address] 05/27/2013, 12:10 AM

## 2013-05-27 NOTE — Progress Notes (Signed)
UR Completed.  Anabelle Bungert Jane Janney Priego [phone] 05/27/2013  

## 2013-05-27 NOTE — Telephone Encounter (Signed)
Just notify Dr. Remi Deter nurse  so that she can inform him that she is in the hospital. He can then decided he wants to see her on May 4.

## 2013-05-27 NOTE — Telephone Encounter (Signed)
She was admitted last night from the ED and Dr. Diona Fanti who is the urologist on call was consulted last night by the ER physician. She is at [facility]

## 2013-05-27 NOTE — ED Provider Notes (Signed)
Medical screening examination/treatment/procedure(s) were performed by non-physician practitioner and as supervising physician I was immediately available for consultation/collaboration.  Leota Jacobsen, MD 05/27/13 [phone]

## 2013-05-27 NOTE — Telephone Encounter (Signed)
So should I cancel appointment on 05/31/13?

## 2013-05-28 DIAGNOSIS — F411 Generalized anxiety disorder: Secondary | ICD-10-CM

## 2013-05-28 LAB — BASIC METABOLIC PANEL
BUN: 6 mg/dL (ref 6–23)
CHLORIDE: 105 meq/L (ref 96–112)
CO2: 19 mEq/L (ref 19–32)
Calcium: 8.2 mg/dL — ABNORMAL LOW (ref 8.4–10.5)
Creatinine, Ser: 0.64 mg/dL (ref 0.50–1.10)
GFR calc non Af Amer: >90 mL/min (ref >90)
Glucose, Bld: 90 mg/dL (ref 70–99)
POTASSIUM: 4 meq/L (ref 3.7–5.3)
Sodium: 136 mEq/L — ABNORMAL LOW (ref 137–147)

## 2013-05-28 LAB — URINE CULTURE
Colony Count: NO GROWTH
Culture: NO GROWTH

## 2013-05-28 LAB — CBC
HEMATOCRIT: 37.6 % (ref 36.0–46.0)
Hemoglobin: 12.6 g/dL (ref 12.0–15.0)
MCH: 31.8 pg (ref 26.0–34.0)
MCHC: 33.5 g/dL (ref 30.0–36.0)
MCV: 94.9 fL (ref 78.0–100.0)
Platelets: 255 10*3/uL (ref 150–400)
RBC: 3.96 MIL/uL (ref 3.87–5.11)
RDW: 13.6 % (ref 11.5–15.5)
WBC: 4.2 10*3/uL (ref 4.0–10.5)

## 2013-05-28 NOTE — Progress Notes (Signed)
RN gave report to Amy, receiving RN on 5W. All questions answered.   Patient transferred to 1527.

## 2013-05-28 NOTE — Progress Notes (Signed)
Patient ID: [name], female   DOB: March 06, 1970, 43 y.o.   MRN: [id number]  [facility] PROGRESS NOTE  Vanesa Renier GMW:[id number] DOB: 1970-08-13 DOA: 05/26/2013 PCP: Ginger Organ, MD  Brief narrative:  43 y.o. female presented to [facility] ED with main concern of persistent N/V, poor oral intake several days in duration with no specific alleviating factors. This has been associated with left lower quadrant abd pain, throbbing and constant, 5/10 in severity and radiating to the left flank area. She denies fevers, chills, chest pain or shortness of breath.   Principal Problem:  Intractable nausea and vomiting  - this appears to be secondary to nephrolithiasis  - pt still uncomfortable with vomiting  - keep on clear liquid duet as we have tried to advance but pt still vomiting  - continue analgesia and antiemetics as needed  - keep on IVF  Active Problems:  Hematuria  - unclear etiology  - will ask urology for further input  Left nephrolithiasis  - continue IVF, empiric ABX until urine culture is back  - analgesia as needed  - urology consulted   Consultants:  None Procedures/Studies:  Ct Abdomen Pelvis Wo Contrast 05/26/2013 Nonobstructing left renal calculus is similar to previous exam. No hydronephrosis or ureteral calculus identified. Previous hysterectomy. The left ovary remains and appears slightly larger than on previous examination.  Antibiotics:  Rocephin 4/30 -->   Code Status: Full  Family Communication: Pt at bedside  Disposition Plan: Home when medically stable  HPI/Subjective: No events overnight.   Objective: Filed Vitals:   05/27/13 1350 05/27/13 1819 05/27/13 2253 05/28/13 0607  BP: 120/66 122/65 118/72 127/74  Pulse: 93 92 84 82  Temp: 98.3 F (36.8 C) 98.3 F (36.8 C) 98.1 F (36.7 C) 98.1 F (36.7 C)  TempSrc: Oral Oral Oral Oral  Resp: 16 16 16 16   Height:      Weight:      SpO2: 98% 99% 98% 99%    Intake/Output  Summary (Last 24 hours) at 05/28/13 1232 Last data filed at 05/28/13 0948  Gross per 24 hour  Intake 2234.99 ml  Output    700 ml  Net 1534.99 ml    Exam:   General:  Pt is alert, follows commands appropriately, not in acute distress  Cardiovascular: Regular rate and rhythm, S1/S2, no murmurs, no rubs, no gallops  Respiratory: Clear to auscultation bilaterally, no wheezing, no crackles, no rhonchi  Abdomen: Soft, non tender, non distended, bowel sounds present, no guarding  Extremities: No edema, pulses DP and PT palpable bilaterally  Neuro: Grossly nonfocal  Data Reviewed: Basic Metabolic Panel:  Recent Labs Lab 05/26/13 1809 05/27/13 0442 05/28/13 0400  NA 138 138 136*  K 3.6* 4.1 4.0  CL 102 106 105  CO2 23 22 19   GLUCOSE 97 98 90  BUN 13 10 6   CREATININE 0.72 0.67 0.64  CALCIUM 9.5 8.6 8.2*   CBC:  Recent Labs Lab 05/26/13 1809 05/27/13 0442 05/28/13 0400  WBC 8.7 6.6 4.2  NEUTROABS 5.6  --   --   HGB 14.7 12.9 12.6  HCT 44.1 39.4 37.6  MCV 95.7 95.6 94.9  PLT 329 272 255     Recent Results (from the past 240 hour(s))  URINE CULTURE     Status: None   Collection Time    05/26/13 12:00 AM      Result Value Ref Range Status   Colony Count NO GROWTH   Final   Organism ID, Bacteria NO  GROWTH   Final  WET PREP FOR [place], YEAST, CLUE     Status: Abnormal   Collection Time    05/26/13  4:28 PM      Result Value Ref Range Status   Yeast Wet Prep HPF POC NONE SEEN  NONE SEEN Final   Trich, Wet Prep NONE SEEN  NONE SEEN Final   Clue Cells Wet Prep HPF POC MOD (*) NONE SEEN Final   WBC, Wet Prep HPF POC RARE  NONE SEEN Final   Comment: Bacteria - Too numerous to count     (3-6) EPITH. CELLS PER HPF     AMINE POSITIVE  URINE CULTURE     Status: None   Collection Time    05/26/13  6:29 PM      Result Value Ref Range Status   Specimen Description URINE, CLEAN CATCH   Final   Special Requests NONE   Final   Culture  Setup Time     Final   Value:  05/27/2013 04:06     Performed at [place]     Final   Value: NO GROWTH     Performed at [facility]   Culture     Final   Value: NO GROWTH     Performed at [facility]   Report Status 05/28/2013 FINAL   Final     Scheduled Meds: . cefTRIAXone (ROCEPHIN)  IV  1 g Intravenous Q24H  . enoxaparin (LOVENOX) injection  40 mg Subcutaneous Daily  . tamsulosin  0.4 mg Oral Daily   Continuous Infusions: . sodium chloride 100 mL/hr at 05/28/13 3785     Theodis Blaze, MD  [facility] Pager [phone]  If 7PM-7AM, please contact night-coverage [email or web address] Password [email or web address] 05/28/2013, 12:32 PM   LOS: 2 days

## 2013-05-28 NOTE — Telephone Encounter (Signed)
Appointment cancel and pt can follow up as needed.

## 2013-05-29 DIAGNOSIS — Z87442 Personal history of urinary calculi: Secondary | ICD-10-CM | POA: Diagnosis not present

## 2013-05-29 DIAGNOSIS — M542 Cervicalgia: Secondary | ICD-10-CM | POA: Diagnosis present

## 2013-05-29 DIAGNOSIS — I10 Essential (primary) hypertension: Secondary | ICD-10-CM | POA: Diagnosis present

## 2013-05-29 DIAGNOSIS — Z8249 Family history of ischemic heart disease and other diseases of the circulatory system: Secondary | ICD-10-CM | POA: Diagnosis not present

## 2013-05-29 DIAGNOSIS — N200 Calculus of kidney: Secondary | ICD-10-CM | POA: Diagnosis present

## 2013-05-29 DIAGNOSIS — G8929 Other chronic pain: Secondary | ICD-10-CM | POA: Diagnosis present

## 2013-05-29 DIAGNOSIS — B9689 Other specified bacterial agents as the cause of diseases classified elsewhere: Secondary | ICD-10-CM | POA: Diagnosis present

## 2013-05-29 DIAGNOSIS — N760 Acute vaginitis: Secondary | ICD-10-CM | POA: Diagnosis present

## 2013-05-29 DIAGNOSIS — R1115 Cyclical vomiting syndrome unrelated to migraine: Secondary | ICD-10-CM | POA: Diagnosis present

## 2013-05-29 DIAGNOSIS — M549 Dorsalgia, unspecified: Secondary | ICD-10-CM | POA: Diagnosis present

## 2013-05-29 DIAGNOSIS — A499 Bacterial infection, unspecified: Secondary | ICD-10-CM | POA: Diagnosis present

## 2013-05-29 DIAGNOSIS — Z90710 Acquired absence of both cervix and uterus: Secondary | ICD-10-CM | POA: Diagnosis not present

## 2013-05-29 LAB — BASIC METABOLIC PANEL
BUN: 4 mg/dL — ABNORMAL LOW (ref 6–23)
CO2: 23 mEq/L (ref 19–32)
Calcium: 8.7 mg/dL (ref 8.4–10.5)
Chloride: 105 mEq/L (ref 96–112)
Creatinine, Ser: 0.66 mg/dL (ref 0.50–1.10)
GLUCOSE: 96 mg/dL (ref 70–99)
POTASSIUM: 3.9 meq/L (ref 3.7–5.3)
Sodium: 138 mEq/L (ref 137–147)

## 2013-05-29 LAB — CBC
HCT: 37.6 % (ref 36.0–46.0)
HEMOGLOBIN: 12.2 g/dL (ref 12.0–15.0)
MCH: 31.1 pg (ref 26.0–34.0)
MCHC: 32.4 g/dL (ref 30.0–36.0)
MCV: 95.9 fL (ref 78.0–100.0)
Platelets: 255 10*3/uL (ref 150–400)
RBC: 3.92 MIL/uL (ref 3.87–5.11)
RDW: 13.5 % (ref 11.5–15.5)
WBC: 4.4 10*3/uL (ref 4.0–10.5)

## 2013-05-29 MED ORDER — METOCLOPRAMIDE HCL 5 MG/ML IJ SOLN
10.0000 mg | Freq: Once | INTRAMUSCULAR | Status: AC
  Administered 2013-05-29: 10 mg via INTRAVENOUS
  Filled 2013-05-29: qty 2

## 2013-05-29 NOTE — Progress Notes (Signed)
[facility] PROGRESS NOTE  [name] [id number]:[id number] DOB: [birth year] DOA: 05/26/2013 PCP: Ginger Organ, MD  Brief narrative:  42 y.o. female presented to [facility] ED with main concern of persistent N/V, poor oral intake several days in duration with no specific alleviating factors. This has been associated with left lower quadrant abd pain, throbbing and constant, 5/10 in severity and radiating to the left flank area. She denies fevers, chills, chest pain or shortness of breath. She continues to have some hematuria thought to be due non-obstructing left renal calculus. Patient was seen by [facility] this past week who suggested she follow with gynecology. Today she still complains of flank pain. Urology consult pending. Principal Problem:  Intractable nausea and vomiting  - this appears to be secondary to nephrolithiasis  - pt still uncomfortable with vomiting  - Advance as tolerated  - continue analgesia and antiemetics as needed. Given a dose of Reglan  - keep on IVF  Active Problems:  Hematuria  - unclear etiology  - Await urology input  Left nephrolithiasis  - continue IVF, empiric ABX - analgesia as needed  - urology consulted on 05/28/13  Consultants:  None Procedures/Studies:  Ct Abdomen Pelvis Wo Contrast 05/26/2013 Nonobstructing left renal calculus is similar to previous exam. No hydronephrosis or ureteral calculus identified. Previous hysterectomy. The left ovary remains and appears slightly larger than on previous examination.  Antibiotics:  Rocephin 4/30 -->  Code Status: Full  Family Communication: Pt at bedside  Disposition Plan: Home when medically stable   HPI/Subjective: Still has flank pain and vomiting  Objective: Filed Vitals:   05/29/13 1827  BP: 109/74  Pulse: 95  Temp: 98.8 F (37.1 C)  Resp: 16    Intake/Output Summary (Last 24 hours) at 05/29/13 2121 Last data filed at 05/29/13 1814  Gross per 24 hour  Intake 3982.5 ml   Output    600 ml  Net 3382.5 ml   Filed Weights   05/27/13 0051  Weight: 81.647 kg (180 lb)    Exam:   General:  Appears in discomfort  Cardiovascular: RRR. S1-S2 normal. No murmurs.  Respiratory: Lungs clear  Abdomen: Some tenderness to palpation left flank  Musculoskeletal: No pedal edema.   Data Reviewed: Basic Metabolic Panel:  Recent Labs Lab 05/26/13 1809 05/27/13 0442 05/28/13 0400 05/29/13 0408  NA 138 138 136* 138  K 3.6* 4.1 4.0 3.9  CL 102 106 105 105  CO2 23 22 19 23   GLUCOSE 97 98 90 96  BUN 13 10 6  4*  CREATININE 0.72 0.67 0.64 0.66  CALCIUM 9.5 8.6 8.2* 8.7   Liver Function Tests: No results found for this basename: AST, ALT, ALKPHOS, BILITOT, PROT, ALBUMIN,  in the last 168 hours No results found for this basename: LIPASE, AMYLASE,  in the last 168 hours No results found for this basename: AMMONIA,  in the last 168 hours CBC:  Recent Labs Lab 05/26/13 1809 05/27/13 0442 05/28/13 0400 05/29/13 0408  WBC 8.7 6.6 4.2 4.4  NEUTROABS 5.6  --   --   --   HGB 14.7 12.9 12.6 12.2  HCT 44.1 39.4 37.6 37.6  MCV 95.7 95.6 94.9 95.9  PLT 329 272 255 255   Cardiac Enzymes: No results found for this basename: CKTOTAL, CKMB, CKMBINDEX, TROPONINI,  in the last 168 hours BNP (last 3 results) No results found for this basename: PROBNP,  in the last 8760 hours CBG: No results found for this basename: GLUCAP,  in the last  168 hours  Recent Results (from the past 240 hour(s))  URINE CULTURE     Status: None   Collection Time    05/26/13 12:00 AM      Result Value Ref Range Status   Colony Count NO GROWTH   Final   Organism ID, Bacteria NO GROWTH   Final  WET PREP FOR TRICH, YEAST, CLUE     Status: Abnormal   Collection Time    05/26/13  4:28 PM      Result Value Ref Range Status   Yeast Wet Prep HPF POC NONE SEEN  NONE SEEN Final   Trich, Wet Prep NONE SEEN  NONE SEEN Final   Clue Cells Wet Prep HPF POC MOD (*) NONE SEEN Final   WBC, Wet  Prep HPF POC RARE  NONE SEEN Final   Comment: Bacteria - Too numerous to count     (3-6) EPITH. CELLS PER HPF     AMINE POSITIVE  URINE CULTURE     Status: None   Collection Time    05/26/13  6:29 PM      Result Value Ref Range Status   Specimen Description URINE, CLEAN CATCH   Final   Special Requests NONE   Final   Culture  Setup Time     Final   Value: 05/27/2013 04:06     Performed at [place]     Final   Value: NO GROWTH     Performed at [facility]   Culture     Final   Value: NO GROWTH     Performed at [facility]   Report Status 05/28/2013 FINAL   Final     Studies: No results found.  Scheduled Meds: . cefTRIAXone (ROCEPHIN)  IV  1 g Intravenous Q24H  . enoxaparin (LOVENOX) injection  40 mg Subcutaneous Daily  . tamsulosin  0.4 mg Oral Daily   Continuous Infusions: . sodium chloride 75 mL/hr at 05/29/13 1814       [place]  [facility] Pager [phone]. If 7PM-7AM, please contact night-coverage at [email or web address], password [facility] 05/29/2013, 9:21 PM  LOS: 3 days

## 2013-05-30 DIAGNOSIS — K828 Other specified diseases of gallbladder: Secondary | ICD-10-CM

## 2013-05-30 LAB — COMPREHENSIVE METABOLIC PANEL
ALBUMIN: 3.2 g/dL — AB (ref 3.5–5.2)
ALK PHOS: 66 U/L (ref 39–117)
ALT: 106 U/L — ABNORMAL HIGH (ref 0–35)
AST: 33 U/L (ref 0–37)
BUN: 7 mg/dL (ref 6–23)
CO2: 23 mEq/L (ref 19–32)
Calcium: 8.4 mg/dL (ref 8.4–10.5)
Chloride: 104 mEq/L (ref 96–112)
Creatinine, Ser: 0.6 mg/dL (ref 0.50–1.10)
GFR calc non Af Amer: >90 mL/min (ref >90)
GLUCOSE: 100 mg/dL — AB (ref 70–99)
POTASSIUM: 4.1 meq/L (ref 3.7–5.3)
SODIUM: 138 meq/L (ref 137–147)
TOTAL PROTEIN: 6.4 g/dL (ref 6.0–8.3)
Total Bilirubin: <0.2 mg/dL — ABNORMAL LOW (ref 0.3–1.2)

## 2013-05-30 LAB — CBC
HCT: 40 % (ref 36.0–46.0)
HEMOGLOBIN: 13.3 g/dL (ref 12.0–15.0)
MCH: 31.8 pg (ref 26.0–34.0)
MCHC: 33.3 g/dL (ref 30.0–36.0)
MCV: 95.7 fL (ref 78.0–100.0)
PLATELETS: 268 10*3/uL (ref 150–400)
RBC: 4.18 MIL/uL (ref 3.87–5.11)
RDW: 13.4 % (ref 11.5–15.5)
WBC: 4.4 10*3/uL (ref 4.0–10.5)

## 2013-05-30 LAB — MAGNESIUM: Magnesium: 2 mg/dL (ref 1.5–2.5)

## 2013-05-30 LAB — PHOSPHORUS: Phosphorus: 3.7 mg/dL (ref 2.3–4.6)

## 2013-05-30 MED ORDER — OXYCODONE HCL 5 MG PO TABS: 5.0000 mg | ORAL_TABLET | ORAL | Status: DC | PRN

## 2013-05-30 MED ORDER — ONDANSETRON HCL 4 MG PO TABS: 4.0000 mg | ORAL_TABLET | Freq: Four times a day (QID) | ORAL | Status: DC | PRN

## 2013-05-30 MED ORDER — LORAZEPAM 1 MG PO TABS: 1.0000 mg | ORAL_TABLET | Freq: Three times a day (TID) | ORAL | Status: DC | PRN

## 2013-05-30 NOTE — Discharge Instructions (Signed)
Cálculos renales  (Kidney Stones)  Los cálculos renales (urolitiasis) son masas sólidas que se forman en el interior de los riñones. El dolor intenso es causado por el movimiento de la piedra a través del tracto urinario. Cuando la piedra se mueve, el uréter hace un espasmo alrededor de la misma. El cálculo generalmente se elimina con la orina.   CAUSAS   · Un trastorno que hace que ciertas glándulas del cuello produzcan demasiada hormona paratiroidea (hiperparatiroidismo primario).  · Una acumulación de cristales de ácido úrico, similar a la gota en las articulaciones.  · Estrechamiento (constricción) del uréter.  · Obstrucción en el riñón presente al nacer (obstrucción congénita).  · Cirugías previas del riñón o los uréteres.  · Numerosas infecciones renales.  SÍNTOMAS   · Ganas de vomitar (náuseas).  · Devolver la comida (vomitar).  · Sangre en la orina (hematuria).  · Dolor que generalmente se expande (irradia) hacia la ingle.  · Ganas de orinar con frecuencia o de manera urgente.  DIAGNÓSTICO   · Historia clínica y examen físico.  · Análisis de sangre y orina.  · Tomografía computada.  · En algunos casos se realiza un examen del interior de la vejiga (citoscopía).  TRATAMIENTO   · Observación.  · Aumentar la ingesta de líquidos.  · Litotricia extracorpórea con ondas de choque: es un procedimiento no invasivo que utiliza ondas de choque para romper los cálculos renales.  · Será necesaria la cirugía si tiene dolor muy intenso o la obstrucción persiste. Hay varios procedimientos quirúrgicos. La mayoría de los procedimientos se realizan con el uso de pequeños instrumentos. Sólo es necesario realizar pequeñas incisiones para acomodar estos instrumentos, por lo tanto el tiempo de recuperación es mínimo.  El tamaño, la ubicación y la composición química de los cálculos son variables importantes que determinarán la elección correcta de tratamiento para su caso. Comuníquese con su médico para comprender mejor su  situación, de modo que pueda minimizar los riesgos de lesiones para usted y su riñón.   INSTRUCCIONES PARA EL CUIDADO EN EL HOGAR   · Beba gran cantidad de líquido para mantener la orina de tono claro o color amarillo pálido. Esto ayudará a eliminar las piedras o los fragmentos.  · Cuele la orina con el colador que le han provisto. Guarde todas las partículas y piedras para que las vea el profesional que lo asiste. Puede ser tan pequeña como un grano de sal. Es muy importante usar el colador cada vez que orine. La recolección de piedras permitirá al médico analizar y verificar que efectivamente ha eliminado una piedra. El análisis de la piedra con frecuencia permitirá identificar qué puede hacer para reducir la incidencia de las recurrencias.  · Sólo tome medicamentos de venta libre o recetados para calmar el dolor, el malestar o bajar la fiebre, según las indicaciones de su médico.  · Cumpla con las citas de seguimiento tal como le indicó el profesional que lo asiste.  · Si se lo indica, hágase radiografías. La ausencia de dolor no siempre significa que las piedras se han eliminado. Puede ser que simplemente hayan dejado de moverse. Si el paso de orina permanece completamente obstruido, puede causar pérdida de la función renal o simplemente la destrucción del riñón. Es su responsabilidad completar el seguimiento y las radiografías. Las ecografías del riñón pueden mostrar una obstrucción y el estado del riñón. Las ecografías no se asocian con la radiación y pueden realizarse fácilmente en cuestión de minutos.  SOLICITE ATENCIÓN MÉDICA SI:  ·   Siente dolor que no responde a los analgésicos que le recetaron.  SOLICITE ATENCIÓN MÉDICA DE INMEDIATO SI:   · No puede controlar el dolor con los medicamentos que le han recetado.  · Siente escalofríos o fiebre.  · La gravedad o la intensidad del dolor aumenta durante 18 horas y no se alivia con los analgésicos.  · Presenta un nuevo episodio de dolor abdominal.  · Sufre mareos  o se desmaya.  · No puede orinar.  ASEGÚRESE DE QUE:   · Comprende estas instrucciones.  · Controlará su afección.  · Recibirá ayuda de inmediato si no mejora o si empeora.  Document Released: 01/14/2005 Document Revised: 09/16/2012  ExitCare® Patient Information ©2014 [employer].

## 2013-05-30 NOTE — Discharge Summary (Signed)
Physician Discharge Summary  [name] EQA:[id number] DOB: July 31, 1970 DOA: 05/26/2013  PCP: Ginger Organ, MD  Admit date: 05/26/2013 Discharge date: 05/30/2013  Recommendations for Outpatient Follow-up:  1. Pt will need to follow up with PCP in 2-3 weeks post discharge 2. Please obtain BMP to evaluate electrolytes and kidney function 3. Please also check CBC to evaluate Hg and Hct levels 4. Pt advised to follow up with her urologist Dr. Janice Norrie as scheduled   Discharge Diagnoses: N/V, nephrolithiasis  Principal Problem:   Intractable nausea and vomiting Active Problems:   Hematuria   Left nephrolithiasis   Bacterial vaginosis   Left flank pain   Discharge Condition: Stable  Diet recommendation: Heart healthy diet discussed in details   Brief narrative:  43 y.o. female presented to [facility] ED with main concern of persistent N/V, poor oral intake several days in duration with no specific alleviating factors. This has been associated with left lower quadrant abd pain, throbbing and constant, 5/10 in severity and radiating to the left flank area. She denies fevers, chills, chest pain or shortness of breath.   Principal Problem:  Intractable nausea and vomiting  - this appears to be secondary to nephrolithiasis  - pt feeling better this AM, wants to go home  - pt tolerating regular diet well  - pt reports passing stone  Active Problems:  Hematuria  - unclear etiology  - apparently resolved after passing stone  Left nephrolithiasis  - continued IVF, empiric ABX until urine culture is back which is negative - pt passed stone, ABX discontinued and pt feeling better  - analgesia as needed   Consultants:  None Procedures/Studies:  Ct Abdomen Pelvis Wo Contrast 05/26/2013 Nonobstructing left renal calculus is similar to previous exam. No hydronephrosis or ureteral calculus identified. Previous hysterectomy. The left ovary remains and appears slightly larger than on previous  examination.  Antibiotics:  Rocephin 4/30 --> 5/3  Code Status: Full  Family Communication: Pt at bedside    Discharge Exam: Filed Vitals:   05/30/13 0543  BP: 122/75  Pulse: 80  Temp: 98.5 F (36.9 C)  Resp: 18   Filed Vitals:   05/29/13 1827 05/29/13 2124 05/30/13 0210 05/30/13 0543  BP: 109/74 121/66 123/79 122/75  Pulse: 95 75 87 80  Temp: 98.8 F (37.1 C) 98.8 F (37.1 C) 98.4 F (36.9 C) 98.5 F (36.9 C)  TempSrc: Oral Oral Oral Oral  Resp: 16 18 16 18   Height:      Weight:      SpO2: 97% 96% 97% 97%    General: Pt is alert, follows commands appropriately, not in acute distress Cardiovascular: Regular rate and rhythm, S1/S2 +, no murmurs, no rubs, no gallops Respiratory: Clear to auscultation bilaterally, no wheezing, no crackles, no rhonchi Abdominal: Soft, non tender, non distended, bowel sounds +, no guarding Extremities: no edema, no cyanosis, pulses palpable bilaterally DP and PT Neuro: Grossly nonfocal  Discharge Instructions  Discharge Orders   Future Appointments Provider Department Dept Phone   06/01/2013 8:00 AM Terrance Mass, MD [facility] [phone]   Future Orders Complete By Expires   Diet - low sodium heart healthy  As directed    Increase activity slowly  As directed        Medication List         acetaminophen 325 MG tablet  Commonly known as:  TYLENOL  Take 650 mg by mouth every 6 (six) hours as needed (pain).     acyclovir cream  5 %  Commonly known as:  ZOVIRAX  Apply 1 application topically every 3 (three) hours.     amitriptyline 150 MG tablet  Commonly known as:  ELAVIL  Take 150 mg by mouth at bedtime.     gabapentin 600 MG tablet  Commonly known as:  NEURONTIN  Take 600 mg by mouth 3 (three) times daily.     lisinopril-hydrochlorothiazide 20-25 MG per tablet  Commonly known as:  PRINZIDE,ZESTORETIC  Take 1 tablet by mouth daily.     LORazepam 1 MG tablet  Commonly known as:  ATIVAN  Take  1 tablet (1 mg total) by mouth 3 (three) times daily as needed for anxiety.     meperidine 50 MG tablet  Commonly known as:  DEMEROL  Take 50 mg by mouth every 6 (six) hours as needed (pain). One PO TID prn     ondansetron 4 MG tablet  Commonly known as:  ZOFRAN  Take 1 tablet (4 mg total) by mouth every 6 (six) hours as needed for nausea.     oxyCODONE 5 MG immediate release tablet  Commonly known as:  Oxy IR/ROXICODONE  Take 1 tablet (5 mg total) by mouth every 4 (four) hours as needed for moderate pain.           Follow-up Information   Schedule an appointment as soon as possible for a visit with Ginger Organ, MD.   Specialty:  Internal Medicine   Contact information:   [facility] [address] [place] [address] [phone]       Schedule an appointment as soon as possible for a visit with Nesi, Kirtland Bouchard, MD.   Specialty:  Urology   Contact information:   [place] [address] [phone]       Follow up with Faye Ramsay, MD. (As needed, If symptoms worsen call my cell phone [phone])    Specialty:  Internal Medicine   Contact information:   [address]  [address] [phone]        The results of significant diagnostics from this hospitalization (including imaging, microbiology, ancillary and laboratory) are listed below for reference.     Microbiology: Recent Results (from the past 240 hour(s))  URINE CULTURE     Status: None   Collection Time    05/26/13 12:00 AM      Result Value Ref Range Status   Colony Count NO GROWTH   Final   Organism ID, Bacteria NO GROWTH   Final  WET PREP FOR TRICH, YEAST, CLUE     Status: Abnormal   Collection Time    05/26/13  4:28 PM      Result Value Ref Range Status   Yeast Wet Prep HPF POC NONE SEEN  NONE SEEN Final   Trich, Wet Prep NONE SEEN  NONE SEEN Final   Clue Cells Wet Prep HPF POC MOD (*) NONE SEEN  Final   WBC, Wet Prep HPF POC RARE  NONE SEEN Final   Comment: Bacteria - Too numerous to count     (3-6) EPITH. CELLS PER HPF     AMINE POSITIVE  URINE CULTURE     Status: None   Collection Time    05/26/13  6:29 PM      Result Value Ref Range Status   Specimen Description URINE, CLEAN CATCH   Final   Special Requests NONE   Final  Culture  Setup Time     Final   Value: 05/27/2013 04:06     Performed at [facility]     Final   Value: NO GROWTH     Performed at [facility]   Culture     Final   Value: NO GROWTH     Performed at [facility]   Report Status 05/28/2013 FINAL   Final     Labs: Basic Metabolic Panel:  Recent Labs Lab 05/26/13 1809 05/27/13 0442 05/28/13 0400 05/29/13 0408 05/30/13 0435  NA 138 138 136* 138 138  K 3.6* 4.1 4.0 3.9 4.1  CL 102 106 105 105 104  CO2 23 22 19 23 23   GLUCOSE 97 98 90 96 100*  BUN 13 10 6  4* 7  CREATININE 0.72 0.67 0.64 0.66 0.60  CALCIUM 9.5 8.6 8.2* 8.7 8.4  MG  --   --   --   --  2.0  PHOS  --   --   --   --  3.7   Liver Function Tests:  Recent Labs Lab 05/30/13 0435  AST 33  ALT 106*  ALKPHOS 66  BILITOT <0.2*  PROT 6.4  ALBUMIN 3.2*   CBC:  Recent Labs Lab 05/26/13 1809 05/27/13 0442 05/28/13 0400 05/29/13 0408 05/30/13 0435  WBC 8.7 6.6 4.2 4.4 4.4  NEUTROABS 5.6  --   --   --   --   HGB 14.7 12.9 12.6 12.2 13.3  HCT 44.1 39.4 37.6 37.6 40.0  MCV 95.7 95.6 94.9 95.9 95.7  PLT 329 272 255 255 268   SIGNED: Time coordinating discharge: Over 30 minutes  Theodis Blaze, MD  [facility] 05/30/2013, 8:22 AM Pager [phone]  If 7PM-7AM, please contact night-coverage [email or web address] Password [email or web address]

## 2013-05-30 NOTE — Progress Notes (Signed)
Pt had emesis after breakfast. I gave Zofran po before lunch to see if it would help. She still vomited after eating lunch. Pt was able to hold down liquids. Pt had discharge order, notified MD of the situation. Advised her that pt really wanted to go home. MD said she was going to order Reglan and send to [facility]. I told pt that she had the rx waiting at [facility]. Discharged Pt.

## 2013-05-31 ENCOUNTER — Telehealth: Payer: Self-pay | Admitting: Anesthesiology

## 2013-05-31 NOTE — Telephone Encounter (Signed)
Dr. Ursula Beath called today - she was seen at there ER this weekend- she was told she had BV but was not given any medication to treat it. She wants to know if you can give her a rx for this. She had an appt to come and see you on Wednesday but she say she cant afford it right now after her hospital visit.... Please advise... Thank you

## 2013-05-31 NOTE — Telephone Encounter (Signed)
Call in Flagyl 500 mg bid for 7 days

## 2013-06-01 ENCOUNTER — Ambulatory Visit: Payer: No Typology Code available for payment source | Admitting: Gynecology

## 2013-06-01 ENCOUNTER — Telehealth: Payer: Self-pay | Admitting: *Deleted

## 2013-06-01 MED ORDER — ACYCLOVIR 5 % EX CREA: 1.0000 "application " | TOPICAL_CREAM | CUTANEOUS | Status: DC

## 2013-06-01 MED ORDER — METRONIDAZOLE 500 MG PO TABS
500.0000 mg | ORAL_TABLET | Freq: Two times a day (BID) | ORAL | Status: DC
Start: 2013-06-01 — End: 2013-07-26

## 2013-06-01 NOTE — Telephone Encounter (Signed)
error 

## 2013-06-01 NOTE — Telephone Encounter (Signed)
rx sent

## 2013-06-01 NOTE — Progress Notes (Signed)
Patient called about medications she was discharged home on. Stated she didn't get her antibiotics. I don't see antibiotics on discharge AVS. However there is a note from Dr. Sandrea Hughs nurse about RX to be called in after patient was discharged home. Instructed her to follow up with Dr. Sandrea Hughs office.

## 2013-06-02 ENCOUNTER — Telehealth: Payer: Self-pay | Admitting: *Deleted

## 2013-06-02 MED ORDER — METOCLOPRAMIDE HCL 5 MG PO TABS: 5.0000 mg | ORAL_TABLET | Freq: Three times a day (TID) | ORAL | Status: DC

## 2013-06-02 NOTE — Telephone Encounter (Signed)
Patient left a message wanting a refill on an antibiotic. Left a message for patient to call back.

## 2013-06-10 ENCOUNTER — Ambulatory Visit (INDEPENDENT_AMBULATORY_CARE_PROVIDER_SITE_OTHER): Payer: No Typology Code available for payment source | Admitting: Gynecology

## 2013-06-10 ENCOUNTER — Encounter: Payer: Self-pay | Admitting: Gynecology

## 2013-06-10 ENCOUNTER — Other Ambulatory Visit: Payer: Self-pay | Admitting: Gynecology

## 2013-06-10 ENCOUNTER — Ambulatory Visit (INDEPENDENT_AMBULATORY_CARE_PROVIDER_SITE_OTHER): Payer: No Typology Code available for payment source

## 2013-06-10 ENCOUNTER — Encounter: Payer: Self-pay | Admitting: *Deleted

## 2013-06-10 VITALS — BP 124/88

## 2013-06-10 DIAGNOSIS — R1032 Left lower quadrant pain: Secondary | ICD-10-CM

## 2013-06-10 DIAGNOSIS — Z87442 Personal history of urinary calculi: Secondary | ICD-10-CM

## 2013-06-10 DIAGNOSIS — R319 Hematuria, unspecified: Secondary | ICD-10-CM

## 2013-06-10 DIAGNOSIS — M549 Dorsalgia, unspecified: Secondary | ICD-10-CM

## 2013-06-10 DIAGNOSIS — N8300 Follicular cyst of ovary, unspecified side: Secondary | ICD-10-CM

## 2013-06-10 LAB — URINALYSIS W MICROSCOPIC + REFLEX CULTURE
Bilirubin Urine: NEGATIVE
Casts: NONE SEEN
Crystals: NONE SEEN
Glucose, UA: NEGATIVE mg/dL
Ketones, ur: NEGATIVE mg/dL
LEUKOCYTES UA: NEGATIVE
Nitrite: NEGATIVE
PH: 6.5 (ref 5.0–8.0)
PROTEIN: NEGATIVE mg/dL
Specific Gravity, Urine: 1.015 (ref 1.005–1.030)
Urobilinogen, UA: 0.2 mg/dL (ref 0.0–1.0)
WBC UA: NONE SEEN WBC/hpf (ref <3)

## 2013-06-10 NOTE — Addendum Note (Signed)
Addended by: Thurnell Garbe A on: 06/10/2013 12:55 PM   Modules accepted: Orders

## 2013-06-10 NOTE — Progress Notes (Signed)
Patient is a 43 year old who was seen in the office on April 29 complaining of frequent urination along with blood in her urine and pelvic pressure and left lower back discomfort. She had denied any fever or chills or any nausea or vomiting at that time. Patient prior to that was seen in the office on March 26 with similar complaints. The prior year she was seen in the emergency room where she spontaneously passed kidney stones. She had a urine culture March 26 which was negative. She does have some issues with stress urinary incontinence in the past and on exam had been noted to have a normal Q-tip angle test of less than 30. Patient with past history of total abdominal hysterectomy with right salpingo-oophorectomy secondary to dysmenorrhea, menorrhagia, endometrial polyps and endometriosis.  Patient was subsequently referred to the urologist because of high suspicion of recurrent nephrolithiasis. She saw Dr. Kellie Simmering on April 9 and a CT was done and she has informed me that she was told that she had several kidney stones on her left. She was instructed to follow up with me her gynecologist to make sure there was nothing GYN related and that then he would see her back again.  On that office visit April 29 we did an ultrasound with the following findings: Absent uterus and right adnexa. Left ovary with a small thin echo free follicle measuring 27 x 21 x 21 mm avascular. No apparent masses seen on right or left adnexa.  Blood flow was noted to the left ovary. Transvaginal and transabdominal scan done.  Patient was referred to the emergency room that day because of the continued pain and she was eventually admitted to the hospital for pain management and nausea control with a working diagnosis of nephrolithiasis. According to the discharge summary it appears that she passed a stone and a discontinued her antibiotics she was feeling better and was discharged home with analgesia when necessary. She continued  to have hematuria on discharge and her urine culture was negative. She had a normal CBC on that admission. Patient stated that to be in 5 days in the hospital urology service was consulted and Dr.Nessi who had seen her before as an outpatient and never came by to see her. She subsequently went to see her PCP who is in the process of scheduling upon with another urologist. She presented to the office today once again with a flank and left lower abdominal discomfort hematuria and occasional nausea but no vomiting reported.  Exam: Back: Positive left CVA tenderness Abdomen: Soft tender in is noted in the left lower abdomen Pelvic: Bartholin urethra Skene within normal limits Vagina: No lesions or discharge Vaginal cuff and tagged Bimanual exam right adnexa no palpable mass or tenderness Left adnexa slightly tender but no rebound or guarding. Rectal exam: Unremarkable  Ultrasound today: Absent uterus and absent right adnexa due to her prior history of hysterectomy with right salpingo-oophorectomy. Right adnexa was normal. Left ovary with avascular follicle 14 mm and the second one 16 x 12 mm. Arterial blood flow was seen on the left ovary. No apparent adnexal masses in the left lower quadrant.  Urinalysis: RBC 21-50 per high power field Bacteria rare White blood cells none seen Cast None seen  Assessment/plan: Patient with persistent left flank pain and left lower abdominal pain. Persistent hematuria. History of nephrolithiasis. Recently discharged from the hospital after having passed kidney stone. Patient with symptoms still present. Patient will be referred to urologist today for which she  has an appointment to 15 for further assessment and treatment. We will provide a copy of this office note. Imaging studies can be obtained through [facility]. Her symptomatology is not GYN related it would not explain her gross hematuria. Her hysterectomy was 5-1/2 years ago. Patient has had long-standing history of  nephrolithiasis.

## 2013-06-10 NOTE — Progress Notes (Signed)
Patient ID: [name], female   DOB: [birth year], 43 y.o.   MRN: [id number] Pt has appointment with NP Isidor Holts at [facility] urology pt aware due to left flank pain and hematuria

## 2013-06-12 ENCOUNTER — Other Ambulatory Visit: Payer: Self-pay | Admitting: Gynecology

## 2013-06-12 LAB — URINE CULTURE
Colony Count: NO GROWTH
ORGANISM ID, BACTERIA: NO GROWTH

## 2013-06-14 ENCOUNTER — Other Ambulatory Visit: Payer: Self-pay | Admitting: Gynecology

## 2013-06-14 MED ORDER — MEPERIDINE HCL 50 MG PO TABS: 50.0000 mg | ORAL_TABLET | Freq: Four times a day (QID) | ORAL | Status: DC | PRN

## 2013-06-14 NOTE — Telephone Encounter (Signed)
No more refills 

## 2013-06-14 NOTE — Telephone Encounter (Signed)
Dr. Toney Rakes has okayed this refill one last time for you.  He said this must be this last refill. You will need to come by and pick up the written Rx.

## 2013-06-14 NOTE — Telephone Encounter (Signed)
Patient informed that written Rx is at front desk and that this is the last Rx refill he is comfortable giving her.

## 2013-06-23 ENCOUNTER — Other Ambulatory Visit: Payer: Self-pay | Admitting: Gynecology

## 2013-07-05 ENCOUNTER — Other Ambulatory Visit: Payer: Self-pay | Admitting: Gynecology

## 2013-07-22 ENCOUNTER — Other Ambulatory Visit: Payer: Self-pay | Admitting: Urology

## 2013-07-26 ENCOUNTER — Ambulatory Visit (HOSPITAL_COMMUNITY): Payer: No Typology Code available for payment source

## 2013-07-26 ENCOUNTER — Encounter (HOSPITAL_COMMUNITY): Payer: Self-pay | Admitting: General Practice

## 2013-07-26 ENCOUNTER — Ambulatory Visit (HOSPITAL_COMMUNITY)
Admission: RE | Admit: 2013-07-26 | Discharge: 2013-07-26 | Disposition: A | Discharge status: Home / Self Care | Payer: No Typology Code available for payment source | Source: Ambulatory Visit | Attending: Urology | Admitting: Urology

## 2013-07-26 ENCOUNTER — Encounter (HOSPITAL_COMMUNITY)
Admission: RE | Disposition: A | Discharge status: Home / Self Care | Payer: Self-pay | Source: Ambulatory Visit | Attending: Urology

## 2013-07-26 DIAGNOSIS — Z79899 Other long term (current) drug therapy: Secondary | ICD-10-CM | POA: Insufficient documentation

## 2013-07-26 DIAGNOSIS — Z91040 Latex allergy status: Secondary | ICD-10-CM | POA: Diagnosis not present

## 2013-07-26 DIAGNOSIS — R569 Unspecified convulsions: Secondary | ICD-10-CM | POA: Diagnosis not present

## 2013-07-26 DIAGNOSIS — Z888 Allergy status to other drugs, medicaments and biological substances status: Secondary | ICD-10-CM | POA: Insufficient documentation

## 2013-07-26 DIAGNOSIS — I10 Essential (primary) hypertension: Secondary | ICD-10-CM | POA: Insufficient documentation

## 2013-07-26 DIAGNOSIS — N200 Calculus of kidney: Secondary | ICD-10-CM | POA: Insufficient documentation

## 2013-07-26 SURGERY — LITHOTRIPSY, ESWL
Anesthesia: LOCAL | Laterality: Left

## 2013-07-26 MED ORDER — DEXTROSE-NACL 5-0.45 % IV SOLN
INTRAVENOUS | Status: DC
  Administered 2013-07-26: 16:00:00 via INTRAVENOUS

## 2013-07-26 MED ORDER — HYDROMORPHONE HCL PF 1 MG/ML IJ SOLN
0.5000 mg | INTRAMUSCULAR | Status: DC | PRN
  Administered 2013-07-26: 0.5 mg via INTRAVENOUS
  Filled 2013-07-26: qty 1

## 2013-07-26 MED ORDER — DIPHENHYDRAMINE HCL 25 MG PO CAPS
25.0000 mg | ORAL_CAPSULE | ORAL | Status: AC
  Administered 2013-07-26: 25 mg via ORAL
  Filled 2013-07-26: qty 1

## 2013-07-26 MED ORDER — CIPROFLOXACIN HCL 500 MG PO TABS
500.0000 mg | ORAL_TABLET | ORAL | Status: AC
  Administered 2013-07-26: 500 mg via ORAL
  Filled 2013-07-26: qty 1

## 2013-07-26 MED ORDER — DIAZEPAM 5 MG PO TABS
10.0000 mg | ORAL_TABLET | ORAL | Status: AC
  Administered 2013-07-26: 10 mg via ORAL
  Filled 2013-07-26: qty 2

## 2013-07-26 MED ORDER — PROMETHAZINE HCL 25 MG/ML IJ SOLN
12.5000 mg | Freq: Four times a day (QID) | INTRAMUSCULAR | Status: DC | PRN
  Administered 2013-07-26: 12.5 mg via INTRAVENOUS
  Filled 2013-07-26: qty 1

## 2013-07-26 NOTE — Discharge Instructions (Addendum)
You have pain medications at home. A medication for nausea will be called into [facility] on [address].     Follow instructions from [facility].

## 2013-07-26 NOTE — H&P (Signed)
History of Present Illness [name] returns for follow-up. She still complains of moderate and constant left flank pain. The pain radiates to the left lower quadrant. She is known to have anon obstructing left upper pole renal calculus. She also complains of frequency, urgency, urge incontinence. Urinalysis shows TNTC RBC's, 0-2 WBC's, rare bacteria.   Past Medical History Problems  1. History of Anxiety (300.00) 2. History of cardiac arrhythmia (V12.59) 3. History of depression (V11.8) 4. History of hypertension (V12.59) 5. History of renal calculi (V13.01) 6. History of Seizures (780.39)  Surgical History Problems  1. History of Gallbladder Surgery 2. History of Hernia Repair 3. History of Hysterectomy 4. History of Thoracic Deep Incision  Current Meds 1. Amitriptyline HCl - 50 MG Oral Tablet;  Therapy: (Recorded:09Apr2015) to Recorded 2. Gabapentin 300 MG Oral Capsule; TAKE 1 CAPSULE TID;  Therapy: [id number] to (Evaluate:15Nov2015)  Requested for: [id number]; Last  Rx:19May2015 Ordered 3. Gabapentin 800 MG Oral Tablet;  Therapy: (Recorded:19May2015) to Recorded 4. Lisinopril-HCTZ 25-20 MG TABS;  Therapy: (Recorded:09Apr2015) to Recorded 5. OxyCODONE HCl - 5 MG Oral Tablet; TAKE 1 TABLET EVERY 4 HOURS AS NEEDED  FOR PAIN;  Therapy: 13Apr2015 to (Evaluate:18Apr2015); Last Rx:13Apr2015 Ordered  Allergies Medication  1. diclofenac 2. Latex Exam Gloves MISC  Family History Problems  1. Family history of Death of family member : Father   Father died of a stroke and heart failure at age 22 in 51. 2. Family history of cardiac disorder (V17.49) : Mother, Father 3. Family history of diabetes mellitus (V18.0) : Mother 4. Family history of prostate cancer (F79.02) : Father 5. Family history of stroke (V17.1) : Father 6. Family history of Hematuria : Mother  Social History Problems  1. Caffeine use (V49.89)   Drinks 1/2 can to 1 can a day 2. Married 3. Never  smoker 4. Number of children   1 son and 1 daughter  Review of Systems Genitourinary, constitutional, skin, eye, otolaryngeal, hematologic/lymphatic, cardiovascular, pulmonary, endocrine, musculoskeletal, gastrointestinal, neurological and psychiatric system(s) were reviewed and pertinent findings if present are noted.  Gastrointestinal: flank pain.    Physical Exam Constitutional: Well nourished and well developed . No acute distress.  ENT:. The ears and nose are normal in appearance.  Neck: The appearance of the neck is normal and no neck mass is present.  Pulmonary: No respiratory distress and normal respiratory rhythm and effort.  Cardiovascular: Heart rate and rhythm are normal . No peripheral edema.  Abdomen: The abdomen is soft and nontender. No masses are palpated. No CVA tenderness. No hernias are palpable. No hepatosplenomegaly noted.  Genitourinary:  The adnexa are palpably normal.  Lymphatics: The femoral and inguinal nodes are not enlarged or tender.  Skin: Normal skin turgor, no visible rash and no visible skin lesions.  Neuro/Psych:. Mood and affect are appropriate.    Results/Data Urine [Data Includes: Last 1 Day]   [id number] COLOR AMBER  APPEARANCE CLOUDY  SPECIFIC GRAVITY 1.025  pH 5.5  GLUCOSE NEG mg/dL BILIRUBIN SMALL  KETONE NEG mg/dL BLOOD LARGE  PROTEIN NEG mg/dL UROBILINOGEN 0.2 mg/dL NITRITE NEG  LEUKOCYTE ESTERASE NEG  SQUAMOUS EPITHELIAL/HPF RARE  WBC 0-2 WBC/hpf RBC TNTC RBC/hpf BACTERIA RARE  CRYSTALS NONE SEEN  CASTS NONE SEEN   Assessment Assessed  1. Left flank pain (789.09) 2. Calculus of left kidney (592.0)  Plan Health Maintenance  1. UA With REFLEX; [Do Not Release]; Status:Complete;   Done: [id number] 08:39AM Microscopic hematuria  2. URINE CULTURE; Status:Hold For - Specimen/Data Collection,Appointment; Requested  DTO:[id number];   Urine culture. Treat if culture is positive. Since patient continues to complain of left flank  pain, will proceed with ESL of the renal calculus. If her pain persists after ESL and the kidney is clear of stone will refer her back to Dr Toney Rakes for further evaluation. The procedure, risks, benefits were explained to the patient. The risks include but are not limited to hemorrhage, infection, injury to adjacent organs, renal or perirenal hematoma, inability to fragment the stone, steinstrasse. She understands and wishes to proceed.

## 2013-07-26 NOTE — Progress Notes (Signed)
Paged Dr Jeffie Pollock about patient's pain and nausea. Orders received to give pain and nausea medications. Nausea medications to be called into [facility] on [address] as pt reports nausea when taking hydrocodone at home.

## 2013-07-26 NOTE — Progress Notes (Signed)
Message left with scheduler at [facility] that staff has never been able to contact patient for today's lithotripsy.

## 2013-07-26 NOTE — Op Note (Signed)
Refer to [facility] Op Note scanned in the chart 

## 2013-09-15 ENCOUNTER — Emergency Department (HOSPITAL_COMMUNITY): Payer: No Typology Code available for payment source

## 2013-09-15 ENCOUNTER — Encounter (HOSPITAL_COMMUNITY): Payer: Self-pay | Admitting: Emergency Medicine

## 2013-09-15 ENCOUNTER — Emergency Department (HOSPITAL_COMMUNITY)
Admission: EM | Admit: 2013-09-15 | Discharge: 2013-09-15 | Disposition: A | Discharge status: Home / Self Care | Payer: No Typology Code available for payment source | Attending: Emergency Medicine | Admitting: Emergency Medicine

## 2013-09-15 DIAGNOSIS — R1032 Left lower quadrant pain: Secondary | ICD-10-CM | POA: Diagnosis not present

## 2013-09-15 DIAGNOSIS — R112 Nausea with vomiting, unspecified: Secondary | ICD-10-CM | POA: Insufficient documentation

## 2013-09-15 DIAGNOSIS — I10 Essential (primary) hypertension: Secondary | ICD-10-CM | POA: Insufficient documentation

## 2013-09-15 DIAGNOSIS — Z8659 Personal history of other mental and behavioral disorders: Secondary | ICD-10-CM | POA: Insufficient documentation

## 2013-09-15 DIAGNOSIS — G8929 Other chronic pain: Secondary | ICD-10-CM | POA: Diagnosis not present

## 2013-09-15 DIAGNOSIS — Z9089 Acquired absence of other organs: Secondary | ICD-10-CM | POA: Insufficient documentation

## 2013-09-15 DIAGNOSIS — R109 Unspecified abdominal pain: Secondary | ICD-10-CM | POA: Insufficient documentation

## 2013-09-15 DIAGNOSIS — Z90710 Acquired absence of both cervix and uterus: Secondary | ICD-10-CM | POA: Insufficient documentation

## 2013-09-15 DIAGNOSIS — Z862 Personal history of diseases of the blood and blood-forming organs and certain disorders involving the immune mechanism: Secondary | ICD-10-CM | POA: Diagnosis not present

## 2013-09-15 DIAGNOSIS — Z9889 Other specified postprocedural states: Secondary | ICD-10-CM | POA: Diagnosis not present

## 2013-09-15 DIAGNOSIS — Z87442 Personal history of urinary calculi: Secondary | ICD-10-CM | POA: Diagnosis not present

## 2013-09-15 DIAGNOSIS — Z79899 Other long term (current) drug therapy: Secondary | ICD-10-CM | POA: Insufficient documentation

## 2013-09-15 DIAGNOSIS — Z91040 Latex allergy status: Secondary | ICD-10-CM | POA: Insufficient documentation

## 2013-09-15 LAB — COMPREHENSIVE METABOLIC PANEL
ALT: 16 U/L (ref 0–35)
ANION GAP: 16 — AB (ref 5–15)
AST: 21 U/L (ref 0–37)
Albumin: 4.5 g/dL (ref 3.5–5.2)
Alkaline Phosphatase: 50 U/L (ref 39–117)
BUN: 13 mg/dL (ref 6–23)
CALCIUM: 10 mg/dL (ref 8.4–10.5)
CO2: 22 meq/L (ref 19–32)
Chloride: 105 mEq/L (ref 96–112)
Creatinine, Ser: 0.67 mg/dL (ref 0.50–1.10)
Glucose, Bld: 96 mg/dL (ref 70–99)
Potassium: 3.7 mEq/L (ref 3.7–5.3)
SODIUM: 143 meq/L (ref 137–147)
TOTAL PROTEIN: 8.3 g/dL (ref 6.0–8.3)
Total Bilirubin: 0.6 mg/dL (ref 0.3–1.2)

## 2013-09-15 LAB — URINALYSIS, ROUTINE W REFLEX MICROSCOPIC
Bilirubin Urine: NEGATIVE
Glucose, UA: NEGATIVE mg/dL
HGB URINE DIPSTICK: NEGATIVE
Ketones, ur: NEGATIVE mg/dL
Leukocytes, UA: NEGATIVE
NITRITE: NEGATIVE
Protein, ur: NEGATIVE mg/dL
SPECIFIC GRAVITY, URINE: 1.015 (ref 1.005–1.030)
Urobilinogen, UA: 0.2 mg/dL (ref 0.0–1.0)
pH: 6.5 (ref 5.0–8.0)

## 2013-09-15 LAB — CBC WITH DIFFERENTIAL/PLATELET
Basophils Absolute: 0 10*3/uL (ref 0.0–0.1)
Basophils Relative: 1 % (ref 0–1)
EOS ABS: 0 10*3/uL (ref 0.0–0.7)
EOS PCT: 0 % (ref 0–5)
HEMATOCRIT: 43.3 % (ref 36.0–46.0)
Hemoglobin: 14.3 g/dL (ref 12.0–15.0)
Lymphocytes Relative: 35 % (ref 12–46)
Lymphs Abs: 2 10*3/uL (ref 0.7–4.0)
MCH: 31.8 pg (ref 26.0–34.0)
MCHC: 33 g/dL (ref 30.0–36.0)
MCV: 96.4 fL (ref 78.0–100.0)
Monocytes Absolute: 0.5 10*3/uL (ref 0.1–1.0)
Monocytes Relative: 8 % (ref 3–12)
Neutro Abs: 3.2 10*3/uL (ref 1.7–7.7)
Neutrophils Relative %: 56 % (ref 43–77)
PLATELETS: 297 10*3/uL (ref 150–400)
RBC: 4.49 MIL/uL (ref 3.87–5.11)
RDW: 13.5 % (ref 11.5–15.5)
WBC: 5.8 10*3/uL (ref 4.0–10.5)

## 2013-09-15 LAB — WET PREP, GENITAL
Clue Cells Wet Prep HPF POC: NONE SEEN
TRICH WET PREP: NONE SEEN
YEAST WET PREP: NONE SEEN

## 2013-09-15 LAB — POC OCCULT BLOOD, ED: Fecal Occult Bld: NEGATIVE

## 2013-09-15 MED ORDER — OXYCODONE-ACETAMINOPHEN 5-325 MG PO TABS: 1.0000 | ORAL_TABLET | ORAL | Status: DC | PRN

## 2013-09-15 MED ORDER — ONDANSETRON HCL 4 MG/2ML IJ SOLN
4.0000 mg | Freq: Once | INTRAMUSCULAR | Status: AC
  Administered 2013-09-15: 4 mg via INTRAVENOUS
  Filled 2013-09-15: qty 2

## 2013-09-15 MED ORDER — IOHEXOL 300 MG/ML  SOLN
100.0000 mL | Freq: Once | INTRAMUSCULAR | Status: AC | PRN
  Administered 2013-09-15: 100 mL via INTRAVENOUS

## 2013-09-15 MED ORDER — IOHEXOL 300 MG/ML  SOLN
25.0000 mL | Freq: Once | INTRAMUSCULAR | Status: AC | PRN
  Administered 2013-09-15: 25 mL via ORAL

## 2013-09-15 MED ORDER — HYDROMORPHONE HCL PF 1 MG/ML IJ SOLN
1.0000 mg | Freq: Once | INTRAMUSCULAR | Status: AC
  Administered 2013-09-15: 1 mg via INTRAVENOUS
  Filled 2013-09-15: qty 1

## 2013-09-15 MED ORDER — ONDANSETRON HCL 4 MG PO TABS: 4.0000 mg | ORAL_TABLET | Freq: Four times a day (QID) | ORAL | Status: DC

## 2013-09-15 NOTE — ED Notes (Signed)
Pt c/o LLQ pain abd pain starting last night; pt sts N/V with hx of kidney stones

## 2013-09-15 NOTE — Discharge Instructions (Signed)
Dolor abdominal en las mujeres °(Abdominal Pain, Women) °El dolor abdominal (en el estómago, la pelvis o el vientre) puede tener muchas causas. Es importante que le informe a su médico: °· La ubicación del dolor. °· ¿Viene y se va, o persiste todo el tiempo? °· ¿Hay situaciones que inician el dolor (comer ciertos alimentos, la actividad física)? °· ¿Tiene otros síntomas asociados al dolor (fiebre, náuseas, vómitos, diarrea)? °Todo es de gran ayuda cuando se trata de hallar la causa del dolor. °CAUSAS °· Estómago: Infecciones por virus o bacterias, o úlcera. °· Intestino: Apendicitis (apéndice inflamado), ileitis regional (enfermedad de Crohn), colitis ulcerosa (colon inflamado), síndrome del colon irritable, diverticulitis (inflamación de los divertículos del colon) o cáncer de estómago oo intestino. °· Enfermedades de la vesícula biliar o cálculos. °· Enfermedades renales, cálculos o infecciones en el riñón. °· Infección o cáncer del páncreas. °· Fibromialgia (trastorno doloroso) °· Enfermedades de los órganos femeninos: °¨ Uterus: Útero: fibroma (tumor no canceroso) o infección °¨ Trompas de Falopio: infección o embarazo ectópico °¨ En los ovarios, quistes o tumores. °¨ Adherencias pélvicas (tejido cicatrizal). °¨ Endometriosis (el tejido que cubre el útero se desarrolla en la pelvis y los órganos pélvicos). °¨ Síndrome de congestión pélvica (los órganos femeninos se llenan de sangre antes del periodo menstrual( °¨ Dolor durante el periodo menstrual. °¨ Dolor durante la ovulación (al producir óvulos). °¨ Dolor al usar el DIU (dispositivo intrauterino para el control de la natalidad) °¨ Cáncer en los órganos femeninos. °· Dolor funcional (no está originado en una enfermedad, puede mejorar sin tratamiento). °· Dolor de origen psicológico °· Depresión. °DIAGNÓSTICO °Su médico decidirá la gravedad del dolor a través del examen físico °· Análisis de sangre °· Radiografías °· Ecografías °· TC (tomografía computada, tipo  especial de radiografías). °· IMR (resonancia magnética) °· Cultivos, en el caso una infección °· Colon por enema de bario (se inserta una sustancia de contraste en el intestino grueso para mejorar la observación con rayos X.) °· Colonoscopía (observación del intestino con un tubo luminoso). °· Laparoscopía (examen del interior del abdomen con un tubo que tiene una luz). °· Cirugía exploratoria abdominal mayor (se observa el abdomen realizando una gran incisión). °TRATAMIENTO °El tratamiento dependerá de la causa del problema.  °· Muchos de estos casos pueden controlarse y tratarse en casa. °· Medicamentos de venta libre indicados por el médico. °· Medicamentos con receta. °· Antibióticos, en caso de infección °· Píldoras anticonceptivas, en el caso de períodos dolorosos o dolor al ovular. °· Tratamiento hormonal, para la endometriosis °· Inyecciones para bloqueo nervioso selectivo. °· Fisioterapia. °· Antidepresivos. °· Consejos por parte de un psícólogo o psiquiatra. °· Cirugía mayor o menor. °INSTRUCCIONES PARA EL CUIDADO DOMICILIARIO °· No tome ni administre laxantes a menos que se lo haya indicado su médico. °· Tome analgésicos de venta libre sólo si se lo ha indicado el profesional que lo asiste. No tome aspirina, ya que puede causar molestias en el estómago o hemorragias. °· Consuma una dieta líquida (caldo o agua) según lo indicado por el médico. Progrese lentamente a una dieta blanda, según la tolerancia, si el dolor se relaciona con el estómago o el intestino. °· Tenga un termómetro y tómese la temperatura varias veces al día. °· Haga reposo en la cama y duerma, si esto alivia el dolor. °· Evite las relaciones sexuales, si le producen dolor. °· Evite las situaciones estresantes. °· Cumpla con las visitas y los análisis de control, según las indicaciones de su médico. °· Si el dolor   no se alivia con los medicamentos o la cirugía, puede tratar con: °¨ Acupuntura. °¨ Ejercicios de relajación (yoga,  meditación). °¨ Terapia grupal. °¨ Psicoterapia. °SOLICITE ATENCIÓN MÉDICA SI: °· Nota que ciertos alimentos le producen dolor de estómago. °· El tratamiento indicado para realizar en el hogar no le alivia el dolor. °· Necesita analgésicos más fuertes. °· Quiere que le retiren el DIU. °· Si se siente confundido o desfalleciente. °· Presenta náuseas o vómitos. °· Aparece una erupción cutánea. °· Sufre efectos adversos o una reacción alérgica debido a los medicamentos que toma. °SOLICITE ATENCIÓN MÉDICA DE INMEDIATO SI: °· El dolor persiste o se agrava. °· Tiene fiebre. °· Siente el dolor sólo en algunos sectores del abdomen. Si se localiza en la zona derecha, posiblemente podría tratarse de apendicitis. En un adulto, si se localiza en la región inferior izquierda del abdomen, podría tratarse de colitis o diverticulitis. °· Hay sangre en las heces (deposiciones de color rojo brillante o negro alquitranado), con o sin vómitos. °· Usted presenta sangre en la orina. °· Siente escalofríos con o sin fiebre. °· Se desmaya. °ASEGÚRESE QUE:  °· Comprende estas instrucciones. °· Controlará su enfermedad. °· Solicitará ayuda de inmediato si no mejora o si empeora. °Document Released: 05/02/2008 Document Revised: 04/08/2011 °Exit[employer]® Patient Information ©2015 [employer]. This information is not intended to replace advice given to you by your health care provider. Make sure you discuss any questions you have with your health care provider. ° °

## 2013-09-15 NOTE — ED Notes (Addendum)
This RN contacted by Claiborne Billings from [facility]. Pt reports throat feels tight, pt having leg cramps and itching. Went to assess patient, VSS, HR 81, Resp 16, Spo2 100%, Bp 133/78. Pt calm, awake, alert. Notified Mariana Single, PA-C, continued with exam. No wheezing noted.

## 2013-09-15 NOTE — ED Provider Notes (Signed)
CSN: [id number]     Arrival date & time 09/15/13  3662 History   First MD Initiated Contact with Patient 09/15/13 [id number]     Chief Complaint  Patient presents with  . Abdominal Pain     (Consider location/radiation/quality/duration/timing/severity/associated sxs/prior Treatment) HPI Comments: Patient is a 43 year old female with history of kidney stones, hypertension, seizures who presents to the emergency department today with one week of gradually worsening abdominal pain. She reports the pain is in her left lower quadrant. She has never experienced pain like this in the past. The pain significantly worsened last night. She does have associated nausea, vomiting, diarrhea since yesterday. Emesis is NBNB. She is unsure if there is any blood in her stool. She has never had a colonoscopy in the past. This does not feel like prior kidney stones. She has not taken anything to improve her pain. No fevers, chills, shortness of breath, chest pain, dysuria, urinary urgency, urinary frequency, vaginal discharge.   The history is provided by the patient. No language interpreter was used.    Past Medical History  Diagnosis Date  . Dysrhythmia     not followed by cardiogist  . Anemia   . Blood transfusion   . Seizures   . Headache(784.0)   . Depression   . Abdominal pain     takes omeprazole  . Hypertension     dr Huey Bienenstock  . Chronic back pain greater than 3 months duration   . Chronic neck pain   . Kidney stones    Past Surgical History  Procedure Laterality Date  . Abdominal hysterectomy    . Cesarean section      x2  . Abdominoplasty    . Cervical spine surgery      2002  . Shoulder surgery      left humeral head replacement 2001  . Breast surgery      breast implants x 2  . Cholecystectomy  02/28/2011    Procedure: LAPAROSCOPIC CHOLECYSTECTOMY;  Surgeon: Joyice Faster. Cornett, MD;  Location: [facility];  Service: General;  Laterality: N/A;  Laparoscopic cholecystectomy.  . Ventral hernia  repair  08/22/2011    Procedure: LAPAROSCOPIC VENTRAL HERNIA;  Surgeon: Joyice Faster. Cornett, MD;  Location: [facility];  Service: General;  Laterality: N/A;  . Hernia repair  08/22/11    Ventral  . Back surgery     Family History  Problem Relation Age of Onset  . Cancer Mother     colon, skin  . Anesthesia problems Mother   . Heart disease Father   . Cancer Maternal Aunt     OVARIAN  . Breast cancer Maternal Aunt    History  Substance Use Topics  . Smoking status: Never Smoker   . Smokeless tobacco: Never Used  . Alcohol Use: No   OB History   Grav Para Term Preterm Abortions TAB SAB Ect Mult Living   0              Review of Systems  Constitutional: Negative for fever and chills.  Respiratory: Negative for shortness of breath.   Cardiovascular: Negative for chest pain.  Gastrointestinal: Positive for nausea, vomiting and abdominal pain.  Genitourinary: Negative for dysuria, vaginal bleeding and vaginal discharge.  All other systems reviewed and are negative.     Allergies  Latex and Naproxen  Home Medications   Prior to Admission medications   Medication Sig Start Date End Date Taking? Authorizing Provider  gabapentin (NEURONTIN) 800 MG tablet Take 800  mg by mouth daily.    Yes Historical Provider, MD  lisinopril-hydrochlorothiazide (PRINZIDE,ZESTORETIC) 20-25 MG per tablet Take 1 tablet by mouth every morning.    Yes Historical Provider, MD  Vitamin D, Ergocalciferol, (DRISDOL) 50000 UNITS CAPS capsule Take 50,000 Units by mouth every Friday.   Yes Historical Provider, MD   BP 115/64  Pulse 84  Temp(Src) 98 F (36.7 C) (Oral)  Resp 16  SpO2 98% Physical Exam  Nursing note and vitals reviewed. Constitutional: She is oriented to person, place, and time. She appears well-developed and well-nourished. No distress.  HENT:  Head: Normocephalic and atraumatic.  Right Ear: External ear normal.  Left Ear: External ear normal.  Nose: Nose normal.  Mouth/Throat:  Oropharynx is clear and moist.  Eyes: Conjunctivae are normal.  Neck: Normal range of motion.  Cardiovascular: Normal rate, regular rhythm and normal heart sounds.   Pulmonary/Chest: Effort normal and breath sounds normal. No stridor. No respiratory distress. She has no wheezes. She has no rales.  Abdominal: Soft. Bowel sounds are normal. She exhibits no distension. There is tenderness in the left lower quadrant. There is no rigidity, no rebound and no guarding.  Genitourinary: There is no rash, tenderness, lesion or injury on the right labia. There is no rash, tenderness, lesion or injury on the left labia. Cervix exhibits no motion tenderness, no discharge and no friability. Right adnexum displays no mass, no tenderness and no fullness. Left adnexum displays tenderness. Left adnexum displays no mass and no fullness. No erythema, tenderness or bleeding around the vagina. No foreign body around the vagina. No signs of injury around the vagina. No vaginal discharge found.  Musculoskeletal: Normal range of motion.  Neurological: She is alert and oriented to person, place, and time. She has normal strength.  Skin: Skin is warm and dry. She is not diaphoretic. No erythema.  Psychiatric: She has a normal mood and affect. Her behavior is normal.    ED Course  Procedures (including critical care time) Labs Review Labs Reviewed  WET PREP, GENITAL - Abnormal; Notable for the following:    WBC, Wet Prep HPF POC FEW (*)    All other components within normal limits  COMPREHENSIVE METABOLIC PANEL - Abnormal; Notable for the following:    Anion gap 16 (*)    All other components within normal limits  GC/CHLAMYDIA PROBE AMP  CBC WITH DIFFERENTIAL  URINALYSIS, ROUTINE W REFLEX MICROSCOPIC  LIPASE, BLOOD  OCCULT BLOOD X 1 CARD TO LAB, STOOL  POC OCCULT BLOOD, ED    Imaging Review US Transvaginal Non-ob  09/15/2013   CLINICAL DATA:  Left lower quadrant pain with worsening last night.  EXAM:  TRANSABDOMINAL AND TRANSVAGINAL ULTRASOUND OF PELVIS  DOPPLER ULTRASOUND OF OVARIES  TECHNIQUE: Both transabdominal and transvaginal ultrasound examinations of the pelvis were performed. Transabdominal technique was performed for global imaging of the pelvis including uterus, ovaries, adnexal regions, and pelvic cul-de-sac.  It was necessary to proceed with endovaginal exam following the transabdominal exam to visualize the left ovary. Color and duplex Doppler ultrasound was utilized to evaluate blood flow to the ovaries.  COMPARISON:  CT abdomen pelvis 09/15/2013.  FINDINGS: Uterus  Surgically absent.  Right ovary  Surgically absent.  Left ovary  Measurements: 1.9 x 1.4 x 1.9 cm. Normal appearance/no adnexal mass.  Pulsed Doppler evaluation of the left ovary demonstrates normal low-resistance arterial and venous waveforms.  Other findings  No free fluid.  IMPRESSION: 1. No evidence of left ovarian torsion. 2. Hysterectomy and  right oophorectomy.   Electronically Signed   By: Lorin Picket M.D.   On: 09/15/2013 15:38   US Pelvis Complete  09/15/2013   CLINICAL DATA:  Left lower quadrant pain with worsening last night.  EXAM: TRANSABDOMINAL AND TRANSVAGINAL ULTRASOUND OF PELVIS  DOPPLER ULTRASOUND OF OVARIES  TECHNIQUE: Both transabdominal and transvaginal ultrasound examinations of the pelvis were performed. Transabdominal technique was performed for global imaging of the pelvis including uterus, ovaries, adnexal regions, and pelvic cul-de-sac.  It was necessary to proceed with endovaginal exam following the transabdominal exam to visualize the left ovary. Color and duplex Doppler ultrasound was utilized to evaluate blood flow to the ovaries.  COMPARISON:  CT abdomen pelvis 09/15/2013.  FINDINGS: Uterus  Surgically absent.  Right ovary  Surgically absent.  Left ovary  Measurements: 1.9 x 1.4 x 1.9 cm. Normal appearance/no adnexal mass.  Pulsed Doppler evaluation of the left ovary demonstrates normal  low-resistance arterial and venous waveforms.  Other findings  No free fluid.  IMPRESSION: 1. No evidence of left ovarian torsion. 2. Hysterectomy and right oophorectomy.   Electronically Signed   By: Lorin Picket M.D.   On: 09/15/2013 15:38   Ct Abdomen Pelvis W Contrast  09/15/2013   CLINICAL DATA:  Left lower quadrant pain with nausea and vomiting with alternating constipation and diarrhea; history of previous cholecystectomy, hysterectomy, and ventral hernia repair.  EXAM: CT ABDOMEN AND PELVIS WITH CONTRAST  TECHNIQUE: Multidetector CT imaging of the abdomen and pelvis was performed using the standard protocol following bolus administration of intravenous contrast.  CONTRAST:  17mL OMNIPAQUE IOHEXOL 300 MG/ML SOLN intravenously. The patient also received oral contrast material.  COMPARISON:  Supine abdominal film of July 26, 2013, and CT scan of the abdomen and pelvis of May 26, 2013.  FINDINGS: The stomach is moderately distended with oral contrast. The contrast has reached the mid jejunum. There is no evidence of a small or large bowel obstruction. The stool burden within the colon is normal. There is subjective mild wall thickening of the transverse colon, but this portion of the colon is only partially distended. The descending colon and rectosigmoid exhibit no acute abnormalities.  The gallbladder is surgically absent. There is mild intrahepatic ductal dilation. The pancreas, spleen, adrenal glands, and kidneys exhibit no acute abnormalities. There is a 4 mm diameter nonobstructing upper pole stone on the left. The caliber of the abdominal aorta is normal. The urinary bladder is moderately distended. The uterus is surgically absent. There are no adnexal masses. There is no free pelvic fluid. There is no inguinal nor recurrent ventral hernia. There is no intra-abdominal or pelvic lymphadenopathy nor ascites.  The lung bases exhibit emphysematous changes. The lumbar spine and bony pelvis are grossly  normal.  IMPRESSION: 1. There is no evidence of acute diverticulitis nor other acute bowel abnormality. There is incomplete distention of the transverse colon with subjective wall thickening but this is likely artifactual. However, if the patient's clinical findings strongly suggest diverticulitis, the transverse colon findings findings could reflect mild inflammation. 2. There is no evidence of a recurrent ventral hernia. 3. There is no acute hepatobiliary abnormality nor acute urinary tract abnormality.   Electronically Signed   By: David  Martinique   On: 09/15/2013 12:19   [place] Art/ven Flow Abd Pelv Doppler  09/15/2013   CLINICAL DATA:  Left lower quadrant pain with worsening last night.  EXAM: TRANSABDOMINAL AND TRANSVAGINAL ULTRASOUND OF PELVIS  DOPPLER ULTRASOUND OF OVARIES  TECHNIQUE: Both transabdominal and transvaginal  ultrasound examinations of the pelvis were performed. Transabdominal technique was performed for global imaging of the pelvis including uterus, ovaries, adnexal regions, and pelvic cul-de-sac.  It was necessary to proceed with endovaginal exam following the transabdominal exam to visualize the left ovary. Color and duplex Doppler ultrasound was utilized to evaluate blood flow to the ovaries.  COMPARISON:  CT abdomen pelvis 09/15/2013.  FINDINGS: Uterus  Surgically absent.  Right ovary  Surgically absent.  Left ovary  Measurements: 1.9 x 1.4 x 1.9 cm. Normal appearance/no adnexal mass.  Pulsed Doppler evaluation of the left ovary demonstrates normal low-resistance arterial and venous waveforms.  Other findings  No free fluid.  IMPRESSION: 1. No evidence of left ovarian torsion. 2. Hysterectomy and right oophorectomy.   Electronically Signed   By: Lorin Picket M.D.   On: 09/15/2013 15:38     EKG Interpretation None      MDM   Final diagnoses:  Left lower quadrant pain    Patient is nontoxic, nonseptic appearing, in no apparent distress.  Patient's pain and other symptoms  adequately managed in emergency department.  Labs, imaging and vitals reviewed.  Patient does not meet the SIRS or Sepsis criteria.  On repeat exam patient does not have a surgical abdomen and there are no peritoneal signs.  No indication of appendicitis, bowel obstruction, bowel perforation, cholecystitis, diverticulitis, PID or ectopic pregnancy.  Patient discharged home with symptomatic treatment and given strict instructions for follow-up with their primary care physician.  I have also discussed reasons to return immediately to the ER.  Patient expresses understanding and agrees with plan.    Elwyn Lade, PA-C 09/15/13 1704

## 2013-09-16 LAB — GC/CHLAMYDIA PROBE AMP
CT Probe RNA: NEGATIVE
GC Probe RNA: NEGATIVE

## 2013-09-16 NOTE — ED Provider Notes (Signed)
Medical screening examination/treatment/procedure(s) were performed by non-physician practitioner and as supervising physician I was immediately available for consultation/collaboration.   EKG Interpretation None       Jasper Riling. Alvino Chapel, MD 09/16/13 [phone]

## 2013-10-21 ENCOUNTER — Other Ambulatory Visit: Payer: Self-pay | Admitting: *Deleted

## 2013-10-21 MED ORDER — AMITRIPTYLINE HCL 25 MG PO TABS: 50.0000 mg | ORAL_TABLET | Freq: Every day | ORAL | Status: DC

## 2013-11-02 ENCOUNTER — Telehealth: Payer: Self-pay | Admitting: *Deleted

## 2013-11-02 NOTE — Telephone Encounter (Signed)
PA filled out for Zovirax, this was denied pt will need to have tried oral medication. Such as acyclovir.

## 2014-01-28 ENCOUNTER — Emergency Department (HOSPITAL_COMMUNITY): Payer: No Typology Code available for payment source

## 2014-01-28 ENCOUNTER — Emergency Department (HOSPITAL_COMMUNITY)
Admission: EM | Admit: 2014-01-28 | Discharge: 2014-01-28 | Disposition: A | Discharge status: Home / Self Care | Payer: No Typology Code available for payment source | Attending: Emergency Medicine | Admitting: Emergency Medicine

## 2014-01-28 ENCOUNTER — Encounter (HOSPITAL_COMMUNITY): Payer: Self-pay | Admitting: Emergency Medicine

## 2014-01-28 DIAGNOSIS — R509 Fever, unspecified: Secondary | ICD-10-CM

## 2014-01-28 DIAGNOSIS — Z87442 Personal history of urinary calculi: Secondary | ICD-10-CM | POA: Insufficient documentation

## 2014-01-28 DIAGNOSIS — F329 Major depressive disorder, single episode, unspecified: Secondary | ICD-10-CM | POA: Insufficient documentation

## 2014-01-28 DIAGNOSIS — R059 Cough, unspecified: Secondary | ICD-10-CM

## 2014-01-28 DIAGNOSIS — G40909 Epilepsy, unspecified, not intractable, without status epilepticus: Secondary | ICD-10-CM | POA: Insufficient documentation

## 2014-01-28 DIAGNOSIS — Z79899 Other long term (current) drug therapy: Secondary | ICD-10-CM | POA: Insufficient documentation

## 2014-01-28 DIAGNOSIS — I10 Essential (primary) hypertension: Secondary | ICD-10-CM | POA: Insufficient documentation

## 2014-01-28 DIAGNOSIS — G8929 Other chronic pain: Secondary | ICD-10-CM | POA: Insufficient documentation

## 2014-01-28 DIAGNOSIS — Z91040 Latex allergy status: Secondary | ICD-10-CM | POA: Insufficient documentation

## 2014-01-28 DIAGNOSIS — J069 Acute upper respiratory infection, unspecified: Secondary | ICD-10-CM | POA: Insufficient documentation

## 2014-01-28 DIAGNOSIS — R05 Cough: Secondary | ICD-10-CM

## 2014-01-28 DIAGNOSIS — Z862 Personal history of diseases of the blood and blood-forming organs and certain disorders involving the immune mechanism: Secondary | ICD-10-CM | POA: Insufficient documentation

## 2014-01-28 MED ORDER — ALBUTEROL SULFATE HFA 108 (90 BASE) MCG/ACT IN AERS: 1.0000 | INHALATION_SPRAY | Freq: Four times a day (QID) | RESPIRATORY_TRACT | Status: DC | PRN

## 2014-01-28 MED ORDER — HYDROCODONE-HOMATROPINE 5-1.5 MG/5ML PO SYRP: 5.0000 mL | ORAL_SOLUTION | Freq: Four times a day (QID) | ORAL | Status: DC | PRN

## 2014-01-28 NOTE — ED Notes (Signed)
Pt reports for about a week having cough with congestion and headache

## 2014-01-28 NOTE — ED Provider Notes (Signed)
CSN: [id number]     Arrival date & time 01/28/14  1135 History  This chart was scribed for non-physician practitioner, Alvina Chou, PA-C, working with Tanna Furry, MD by Ladene Artist, ED Scribe. This patient was seen in room TR08C/TR08C and the patient's care was started at 12:07 PM.   Chief Complaint  Patient presents with  . Cough   The history is provided by the patient. No language interpreter was used.   HPI Comments: [name] is a 44 y.o. female, with a h/o dysrhythmia, anemia, seizures, HTN, depression, who presents to the Emergency Department complaining of persistent dry cough over the past week. She reports associated fever 2 days ago that has resolved, chills, congestion, chest tightness. Tmax 104 F, ED temperature 98.2 F. Pt was seen 2 days ago and prescribed azithromycin that started yesterday. Pt's daughter was recently sick with similar symptoms.   Past Medical History  Diagnosis Date  . Dysrhythmia     not followed by cardiogist  . Anemia   . Blood transfusion   . Seizures   . Headache(784.0)   . Depression   . Abdominal pain     takes omeprazole  . Hypertension     dr Huey Bienenstock  . Chronic back pain greater than 3 months duration   . Chronic neck pain   . Kidney stones    Past Surgical History  Procedure Laterality Date  . Abdominal hysterectomy    . Cesarean section      x2  . Abdominoplasty    . Cervical spine surgery      2002  . Shoulder surgery      left humeral head replacement 2001  . Breast surgery      breast implants x 2  . Cholecystectomy  02/28/2011    Procedure: LAPAROSCOPIC CHOLECYSTECTOMY;  Surgeon: Joyice Faster. Cornett, MD;  Location: [place];  Service: General;  Laterality: N/A;  Laparoscopic cholecystectomy.  . Ventral hernia repair  08/22/2011    Procedure: LAPAROSCOPIC VENTRAL HERNIA;  Surgeon: Joyice Faster. Cornett, MD;  Location: [place];  Service: General;  Laterality: N/A;  . Hernia repair  08/22/11    Ventral  . Back surgery      Family History  Problem Relation Age of Onset  . Cancer Mother     colon, skin  . Anesthesia problems Mother   . Heart disease Father   . Cancer Maternal Aunt     OVARIAN  . Breast cancer Maternal Aunt    History  Substance Use Topics  . Smoking status: Never Smoker   . Smokeless tobacco: Never Used  . Alcohol Use: No   OB History    Gravida Para Term Preterm AB TAB SAB Ectopic Multiple Living   0              Review of Systems  Constitutional: Positive for fever (resolved) and chills.  HENT: Positive for congestion.   Respiratory: Positive for cough and chest tightness.   All other systems reviewed and are negative.  Allergies  Latex and Naproxen  Home Medications   Prior to Admission medications   Medication Sig Start Date End Date Taking? Authorizing Provider  amitriptyline (ELAVIL) 25 MG tablet Take 2 tablets (50 mg total) by mouth at bedtime. 10/21/13   Adam Melvern Sample, DO  gabapentin (NEURONTIN) 800 MG tablet Take 800 mg by mouth daily.     Historical Provider, MD  lisinopril-hydrochlorothiazide (PRINZIDE,ZESTORETIC) 20-25 MG per tablet Take 1 tablet by mouth every morning.  Historical Provider, MD  ondansetron (ZOFRAN) 4 MG tablet Take 1 tablet (4 mg total) by mouth every 6 (six) hours. 09/15/13   Elwyn Lade, PA-C  oxyCODONE-acetaminophen (PERCOCET/ROXICET) 5-325 MG per tablet Take 1 tablet by mouth every 4 (four) hours as needed for severe pain. May take 2 tablets PO q 6 hours for severe pain - Do not take with Tylenol as this tablet already contains tylenol 09/15/13   Elwyn Lade, PA-C  Vitamin D, Ergocalciferol, (DRISDOL) 50000 UNITS CAPS capsule Take 50,000 Units by mouth every Friday.    Historical Provider, MD   Triage Vitals: BP 108/68 mmHg  Pulse 97  Temp(Src) 98.2 F (36.8 C) (Oral)  Resp 20  Ht 5' 8.9" (1.75 m)  Wt 190 lb (86.183 kg)  BMI 28.14 kg/m2  SpO2 96% Physical Exam  Constitutional: She is oriented to person, place, and  time. She appears well-developed and well-nourished. No distress.  HENT:  Head: Normocephalic and atraumatic.  Eyes: Conjunctivae and EOM are normal.  Neck: Neck supple.  Cardiovascular: Normal rate.   Pulmonary/Chest: Effort normal. She has wheezes.  Pt coughing throughout exam. Wheezing noted in L and R lower lobe.   Abdominal: Soft.  Musculoskeletal: Normal range of motion.  Neurological: She is alert and oriented to person, place, and time.  Skin: Skin is warm and dry.  Psychiatric: She has a normal mood and affect. Her behavior is normal.  Nursing note and vitals reviewed.  ED Course  Procedures (including critical care time) DIAGNOSTIC STUDIES: Oxygen Saturation is 96% on RA, adequate by my interpretation.    COORDINATION OF CARE: 12:10 PM-Discussed treatment plan which includes CXR with pt at bedside and pt agreed to plan.   Labs Review Labs Reviewed - No data to display  Imaging Review Dg Chest 2 View  01/28/2014   CLINICAL DATA:  Cough and fever for 2 days.  Chest pain for 3 these.  EXAM: CHEST  2 VIEW  COMPARISON:  12/30/2012  FINDINGS: Stable cervical thoracic fusion hardware. The heart is normal in size and stable. The mediastinal and hilar contours are unchanged. Minimal wall streaky basilar scarring changes but no infiltrates, edema or effusions. Remote healed left rib fractures are noted. The thoracic vertebral bodies are intact.  IMPRESSION: No acute cardiopulmonary findings.   Electronically Signed   By: Kalman Jewels M.D.   On: 01/28/2014 13:37    EKG Interpretation None      MDM   Final diagnoses:  Cough  Fever  URI (upper respiratory infection)    1:55 PM Chest xray shows no acute changes. Patient will have hycodan for cough and albuterol inhaler for wheezing. Vitals stable and patient afebrile. Patient instructed to return to the ED with worsening or concerning symptoms.   I personally performed the services described in this documentation, which  was scribed in my presence. The recorded information has been reviewed and is accurate.    Alvina Chou, PA-C 01/28/14 [place], MD 02/04/14 2351

## 2014-01-28 NOTE — Discharge Instructions (Signed)
Take hycodan as needed for cough. Use inhaler as needed for wheezing. Refer to attached documents for more information. Return to the ED with worsening or concerning symptoms.

## 2014-01-28 NOTE — ED Notes (Signed)
Pt to xray with rad tech.

## 2014-03-27 ENCOUNTER — Emergency Department (HOSPITAL_COMMUNITY)
Admission: EM | Admit: 2014-03-27 | Discharge: 2014-03-27 | Disposition: A | Discharge status: Home / Self Care | Payer: No Typology Code available for payment source | Attending: Emergency Medicine | Admitting: Emergency Medicine

## 2014-03-27 ENCOUNTER — Emergency Department (HOSPITAL_COMMUNITY): Payer: No Typology Code available for payment source

## 2014-03-27 ENCOUNTER — Encounter (HOSPITAL_COMMUNITY): Payer: Self-pay | Admitting: Emergency Medicine

## 2014-03-27 DIAGNOSIS — Z87442 Personal history of urinary calculi: Secondary | ICD-10-CM | POA: Diagnosis not present

## 2014-03-27 DIAGNOSIS — R0789 Other chest pain: Secondary | ICD-10-CM | POA: Insufficient documentation

## 2014-03-27 DIAGNOSIS — R319 Hematuria, unspecified: Secondary | ICD-10-CM | POA: Diagnosis not present

## 2014-03-27 DIAGNOSIS — Z8669 Personal history of other diseases of the nervous system and sense organs: Secondary | ICD-10-CM | POA: Diagnosis not present

## 2014-03-27 DIAGNOSIS — Y92410 Unspecified street and highway as the place of occurrence of the external cause: Secondary | ICD-10-CM | POA: Insufficient documentation

## 2014-03-27 DIAGNOSIS — G8929 Other chronic pain: Secondary | ICD-10-CM | POA: Insufficient documentation

## 2014-03-27 DIAGNOSIS — S199XXA Unspecified injury of neck, initial encounter: Secondary | ICD-10-CM | POA: Insufficient documentation

## 2014-03-27 DIAGNOSIS — Z862 Personal history of diseases of the blood and blood-forming organs and certain disorders involving the immune mechanism: Secondary | ICD-10-CM | POA: Insufficient documentation

## 2014-03-27 DIAGNOSIS — Z3202 Encounter for pregnancy test, result negative: Secondary | ICD-10-CM | POA: Diagnosis not present

## 2014-03-27 DIAGNOSIS — Z91040 Latex allergy status: Secondary | ICD-10-CM | POA: Diagnosis not present

## 2014-03-27 DIAGNOSIS — F329 Major depressive disorder, single episode, unspecified: Secondary | ICD-10-CM | POA: Diagnosis not present

## 2014-03-27 DIAGNOSIS — Y998 Other external cause status: Secondary | ICD-10-CM | POA: Diagnosis not present

## 2014-03-27 DIAGNOSIS — R079 Chest pain, unspecified: Secondary | ICD-10-CM

## 2014-03-27 DIAGNOSIS — R51 Headache: Secondary | ICD-10-CM | POA: Diagnosis not present

## 2014-03-27 DIAGNOSIS — Y9389 Activity, other specified: Secondary | ICD-10-CM | POA: Diagnosis not present

## 2014-03-27 DIAGNOSIS — I10 Essential (primary) hypertension: Secondary | ICD-10-CM | POA: Insufficient documentation

## 2014-03-27 DIAGNOSIS — R002 Palpitations: Secondary | ICD-10-CM | POA: Diagnosis not present

## 2014-03-27 DIAGNOSIS — R519 Headache, unspecified: Secondary | ICD-10-CM

## 2014-03-27 DIAGNOSIS — Z79899 Other long term (current) drug therapy: Secondary | ICD-10-CM | POA: Diagnosis not present

## 2014-03-27 DIAGNOSIS — X58XXXA Exposure to other specified factors, initial encounter: Secondary | ICD-10-CM | POA: Diagnosis not present

## 2014-03-27 LAB — CBC WITH DIFFERENTIAL/PLATELET
BASOS PCT: 1 % (ref 0–1)
Basophils Absolute: 0.1 10*3/uL (ref 0.0–0.1)
Eosinophils Absolute: 0 10*3/uL (ref 0.0–0.7)
Eosinophils Relative: 1 % (ref 0–5)
HCT: 42.7 % (ref 36.0–46.0)
Hemoglobin: 14 g/dL (ref 12.0–15.0)
Lymphocytes Relative: 36 % (ref 12–46)
Lymphs Abs: 1.9 10*3/uL (ref 0.7–4.0)
MCH: 32 pg (ref 26.0–34.0)
MCHC: 32.8 g/dL (ref 30.0–36.0)
MCV: 97.5 fL (ref 78.0–100.0)
Monocytes Absolute: 0.5 10*3/uL (ref 0.1–1.0)
Monocytes Relative: 9 % (ref 3–12)
Neutro Abs: 2.8 10*3/uL (ref 1.7–7.7)
Neutrophils Relative %: 53 % (ref 43–77)
Platelets: 290 10*3/uL (ref 150–400)
RBC: 4.38 MIL/uL (ref 3.87–5.11)
RDW: 13.2 % (ref 11.5–15.5)
WBC: 5.2 10*3/uL (ref 4.0–10.5)

## 2014-03-27 LAB — BASIC METABOLIC PANEL
ANION GAP: 6 (ref 5–15)
BUN: 14 mg/dL (ref 6–23)
CALCIUM: 9.4 mg/dL (ref 8.4–10.5)
CHLORIDE: 108 mmol/L (ref 96–112)
CO2: 25 mmol/L (ref 19–32)
CREATININE: 0.77 mg/dL (ref 0.50–1.10)
GFR calc Af Amer: >90 mL/min (ref >90)
GLUCOSE: 79 mg/dL (ref 70–99)
POTASSIUM: 3.9 mmol/L (ref 3.5–5.1)
Sodium: 139 mmol/L (ref 135–145)

## 2014-03-27 LAB — URINALYSIS, ROUTINE W REFLEX MICROSCOPIC
BILIRUBIN URINE: NEGATIVE
GLUCOSE, UA: NEGATIVE mg/dL
KETONES UR: NEGATIVE mg/dL
LEUKOCYTES UA: NEGATIVE
Nitrite: NEGATIVE
Protein, ur: NEGATIVE mg/dL
Specific Gravity, Urine: >1.046 — ABNORMAL HIGH (ref 1.005–1.030)
UROBILINOGEN UA: 0.2 mg/dL (ref 0.0–1.0)
pH: 6.5 (ref 5.0–8.0)

## 2014-03-27 LAB — I-STAT TROPONIN, ED
Troponin i, poc: 0 ng/mL (ref 0.00–0.08)
Troponin i, poc: 0 ng/mL (ref 0.00–0.08)

## 2014-03-27 LAB — URINE MICROSCOPIC-ADD ON

## 2014-03-27 LAB — TSH: TSH: 0.97 u[IU]/mL (ref 0.350–4.500)

## 2014-03-27 LAB — POC URINE PREG, ED: Preg Test, Ur: NEGATIVE

## 2014-03-27 MED ORDER — METOCLOPRAMIDE HCL 5 MG/ML IJ SOLN
10.0000 mg | Freq: Once | INTRAMUSCULAR | Status: AC
  Administered 2014-03-27: 10 mg via INTRAVENOUS
  Filled 2014-03-27: qty 2

## 2014-03-27 MED ORDER — IOHEXOL 350 MG/ML SOLN
80.0000 mL | Freq: Once | INTRAVENOUS | Status: AC | PRN
  Administered 2014-03-27: 80 mL via INTRAVENOUS

## 2014-03-27 MED ORDER — DIPHENHYDRAMINE HCL 50 MG/ML IJ SOLN
25.0000 mg | Freq: Once | INTRAMUSCULAR | Status: AC
  Administered 2014-03-27: 25 mg via INTRAVENOUS
  Filled 2014-03-27: qty 1

## 2014-03-27 MED ORDER — SODIUM CHLORIDE 0.9 % IV BOLUS (SEPSIS)
1000.0000 mL | Freq: Once | INTRAVENOUS | Status: AC
  Administered 2014-03-27: 1000 mL via INTRAVENOUS

## 2014-03-27 NOTE — Discharge Instructions (Signed)
Follow-up with your physician regarding the blood in your urine. You may need a referral to a urologist for further workup.

## 2014-03-27 NOTE — ED Provider Notes (Signed)
I saw and evaluated the patient, reviewed the resident's note and I agree with the findings and plan.   EKG Interpretation   Date/Time:  Sunday March 27 2014 12:49:40 EST Ventricular Rate:  92 PR Interval:  150 QRS Duration: 78 QT Interval:  364 QTC Calculation: 450 R Axis:   56 Text Interpretation:  Normal sinus rhythm Normal ECG No significant change  was found Confirmed by Dagny Fiorentino  MD, TREY ([id number]) on 03/27/2014 7:39:06 PM      43  year old female presenting with atypical chest pain with associated ear whooshing, headache, tingling.  On exam, well appearing, nontoxic, not distressed, normal respiratory effort, normal perfusion, heart sounds normal with regular rate and rhythm, lungs clear to auscultation bilaterally. Workup is reassuring including negative CTA.  She appears stable for discharge with outpatient follow-up.  Clinical Impression: 1. Palpitations   2. Chest pain, unspecified chest pain type   3. Acute nonintractable headache, unspecified headache type   4. Hematuria       Houston Siren III, MD 03/27/14 [phone]
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
## 2014-03-27 NOTE — ED Notes (Signed)
Pt up to bathroom without any problems.  Pt requesting pain med for headache.

## 2014-03-27 NOTE — ED Notes (Signed)
Pt c/o heart feeling like it is beating fast, also c/o pain in left arm.  Pt st's she has had a headache since yesterday.  Pt alert and oriented x's 3.

## 2014-03-27 NOTE — ED Notes (Signed)
Pt c/o palpitations and SOB with some CP x 2 days; pt sts some dizziness today

## 2014-03-27 NOTE — ED Notes (Signed)
Pt denies chest pain at this time.

## 2014-09-03 ENCOUNTER — Emergency Department (HOSPITAL_COMMUNITY): Payer: Self-pay

## 2014-09-03 ENCOUNTER — Emergency Department (HOSPITAL_COMMUNITY): Payer: No Typology Code available for payment source

## 2014-09-03 ENCOUNTER — Encounter (HOSPITAL_COMMUNITY): Payer: Self-pay | Admitting: Family Medicine

## 2014-09-03 ENCOUNTER — Emergency Department (HOSPITAL_COMMUNITY)
Admission: EM | Admit: 2014-09-03 | Discharge: 2014-09-03 | Disposition: A | Discharge status: Home / Self Care | Payer: Self-pay | Attending: Emergency Medicine | Admitting: Emergency Medicine

## 2014-09-03 DIAGNOSIS — Z862 Personal history of diseases of the blood and blood-forming organs and certain disorders involving the immune mechanism: Secondary | ICD-10-CM | POA: Insufficient documentation

## 2014-09-03 DIAGNOSIS — F329 Major depressive disorder, single episode, unspecified: Secondary | ICD-10-CM | POA: Insufficient documentation

## 2014-09-03 DIAGNOSIS — Y92410 Unspecified street and highway as the place of occurrence of the external cause: Secondary | ICD-10-CM | POA: Insufficient documentation

## 2014-09-03 DIAGNOSIS — S79919A Unspecified injury of unspecified hip, initial encounter: Secondary | ICD-10-CM | POA: Insufficient documentation

## 2014-09-03 DIAGNOSIS — G8929 Other chronic pain: Secondary | ICD-10-CM | POA: Insufficient documentation

## 2014-09-03 DIAGNOSIS — Z79899 Other long term (current) drug therapy: Secondary | ICD-10-CM | POA: Insufficient documentation

## 2014-09-03 DIAGNOSIS — S4992XA Unspecified injury of left shoulder and upper arm, initial encounter: Secondary | ICD-10-CM | POA: Insufficient documentation

## 2014-09-03 DIAGNOSIS — R079 Chest pain, unspecified: Secondary | ICD-10-CM

## 2014-09-03 DIAGNOSIS — S0990XA Unspecified injury of head, initial encounter: Secondary | ICD-10-CM | POA: Insufficient documentation

## 2014-09-03 DIAGNOSIS — Y999 Unspecified external cause status: Secondary | ICD-10-CM | POA: Insufficient documentation

## 2014-09-03 DIAGNOSIS — S3992XA Unspecified injury of lower back, initial encounter: Secondary | ICD-10-CM | POA: Insufficient documentation

## 2014-09-03 DIAGNOSIS — R05 Cough: Secondary | ICD-10-CM

## 2014-09-03 DIAGNOSIS — R059 Cough, unspecified: Secondary | ICD-10-CM

## 2014-09-03 DIAGNOSIS — S199XXA Unspecified injury of neck, initial encounter: Secondary | ICD-10-CM | POA: Insufficient documentation

## 2014-09-03 DIAGNOSIS — Z87442 Personal history of urinary calculi: Secondary | ICD-10-CM | POA: Insufficient documentation

## 2014-09-03 DIAGNOSIS — Y9389 Activity, other specified: Secondary | ICD-10-CM | POA: Insufficient documentation

## 2014-09-03 DIAGNOSIS — S299XXA Unspecified injury of thorax, initial encounter: Secondary | ICD-10-CM | POA: Insufficient documentation

## 2014-09-03 DIAGNOSIS — Z91040 Latex allergy status: Secondary | ICD-10-CM | POA: Insufficient documentation

## 2014-09-03 DIAGNOSIS — I10 Essential (primary) hypertension: Secondary | ICD-10-CM | POA: Insufficient documentation

## 2014-09-03 DIAGNOSIS — S79922A Unspecified injury of left thigh, initial encounter: Secondary | ICD-10-CM | POA: Insufficient documentation

## 2014-09-03 DIAGNOSIS — S3991XA Unspecified injury of abdomen, initial encounter: Secondary | ICD-10-CM | POA: Insufficient documentation

## 2014-09-03 LAB — BASIC METABOLIC PANEL
Anion gap: 10 (ref 5–15)
BUN: 12 mg/dL (ref 6–20)
CO2: 21 mmol/L — ABNORMAL LOW (ref 22–32)
CREATININE: 0.63 mg/dL (ref 0.44–1.00)
Calcium: 9.3 mg/dL (ref 8.9–10.3)
Chloride: 107 mmol/L (ref 101–111)
GFR calc non Af Amer: >60 mL/min (ref >60)
Glucose, Bld: 89 mg/dL (ref 65–99)
Potassium: 3.7 mmol/L (ref 3.5–5.1)
Sodium: 138 mmol/L (ref 135–145)

## 2014-09-03 LAB — CBC
HEMATOCRIT: 40.6 % (ref 36.0–46.0)
Hemoglobin: 13.6 g/dL (ref 12.0–15.0)
MCH: 31.9 pg (ref 26.0–34.0)
MCHC: 33.5 g/dL (ref 30.0–36.0)
MCV: 95.1 fL (ref 78.0–100.0)
Platelets: 303 10*3/uL (ref 150–400)
RBC: 4.27 MIL/uL (ref 3.87–5.11)
RDW: 14.1 % (ref 11.5–15.5)
WBC: 7 10*3/uL (ref 4.0–10.5)

## 2014-09-03 LAB — I-STAT BETA HCG BLOOD, ED (MC, WL, AP ONLY): I-stat hCG, quantitative: <5 m[IU]/mL (ref <5)

## 2014-09-03 MED ORDER — OXYCODONE HCL 5 MG PO TABA: 1.0000 | ORAL_TABLET | Freq: Three times a day (TID) | ORAL | Status: DC | PRN

## 2014-09-03 MED ORDER — SODIUM CHLORIDE 0.9 % IV BOLUS (SEPSIS)
1000.0000 mL | Freq: Once | INTRAVENOUS | Status: AC
  Administered 2014-09-03: 1000 mL via INTRAVENOUS

## 2014-09-03 MED ORDER — HYDROMORPHONE HCL 1 MG/ML IJ SOLN
INTRAMUSCULAR | Status: AC
  Administered 2014-09-03: 1 mg via INTRAVENOUS
  Filled 2014-09-03: qty 1

## 2014-09-03 MED ORDER — CYCLOBENZAPRINE HCL 10 MG PO TABS
10.0000 mg | ORAL_TABLET | Freq: Two times a day (BID) | ORAL | Status: DC | PRN
Start: 2014-09-03 — End: 2015-02-23

## 2014-09-03 MED ORDER — HYDROMORPHONE HCL 1 MG/ML IJ SOLN
1.0000 mg | Freq: Once | INTRAMUSCULAR | Status: AC
  Administered 2014-09-03: 1 mg via INTRAVENOUS

## 2014-09-03 MED ORDER — IOHEXOL 300 MG/ML  SOLN
100.0000 mL | Freq: Once | INTRAMUSCULAR | Status: AC | PRN
  Administered 2014-09-03: 100 mL via INTRAVENOUS

## 2014-09-03 MED ORDER — HYDROCODONE-ACETAMINOPHEN 5-325 MG PO TABS: 1.0000 | ORAL_TABLET | Freq: Four times a day (QID) | ORAL | Status: DC | PRN

## 2014-09-03 MED ORDER — GI COCKTAIL ~~LOC~~
30.0000 mL | Freq: Once | ORAL | Status: AC
  Administered 2014-09-03: 30 mL via ORAL
  Filled 2014-09-03: qty 30

## 2014-09-03 MED ORDER — MORPHINE SULFATE 4 MG/ML IJ SOLN
4.0000 mg | Freq: Once | INTRAMUSCULAR | Status: AC
  Administered 2014-09-03: 4 mg via INTRAVENOUS
  Filled 2014-09-03: qty 1
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
## 2014-09-03 NOTE — ED Notes (Addendum)
CT tech called me and told me that patient began complaining of chest pain that started suddenly after xray.  Dr. Mingo Amber informed and 1 mg of dilaudid ordered.  I went to CT to assess patient, lung sounds clear and equal, VSS, patient tearful, patient states she never had chest pain like this before and points to her epigastrum.  Patient repeatedly asks why is my chest hurting I dont know what is going on.  1 mg of dilaudid given and this nurse stayed with patient for the duration of the CT.
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
## 2014-09-03 NOTE — ED Notes (Addendum)
Patient now requesting mylanta because she states she sometimes has problems with indigestion.  States she takes omeprazole daily.

## 2015-02-02 ENCOUNTER — Encounter (HOSPITAL_COMMUNITY): Payer: Self-pay | Admitting: Emergency Medicine

## 2015-02-02 ENCOUNTER — Emergency Department (HOSPITAL_COMMUNITY)
Admission: EM | Admit: 2015-02-02 | Discharge: 2015-02-02 | Disposition: A | Discharge status: Home / Self Care | Payer: No Typology Code available for payment source | Attending: Emergency Medicine | Admitting: Emergency Medicine

## 2015-02-02 ENCOUNTER — Emergency Department (HOSPITAL_COMMUNITY): Payer: No Typology Code available for payment source

## 2015-02-02 DIAGNOSIS — F329 Major depressive disorder, single episode, unspecified: Secondary | ICD-10-CM | POA: Insufficient documentation

## 2015-02-02 DIAGNOSIS — I10 Essential (primary) hypertension: Secondary | ICD-10-CM | POA: Insufficient documentation

## 2015-02-02 DIAGNOSIS — Y92009 Unspecified place in unspecified non-institutional (private) residence as the place of occurrence of the external cause: Secondary | ICD-10-CM | POA: Insufficient documentation

## 2015-02-02 DIAGNOSIS — S90121A Contusion of right lesser toe(s) without damage to nail, initial encounter: Secondary | ICD-10-CM | POA: Insufficient documentation

## 2015-02-02 DIAGNOSIS — W2209XA Striking against other stationary object, initial encounter: Secondary | ICD-10-CM | POA: Insufficient documentation

## 2015-02-02 DIAGNOSIS — S92911A Unspecified fracture of right toe(s), initial encounter for closed fracture: Secondary | ICD-10-CM

## 2015-02-02 DIAGNOSIS — Z79899 Other long term (current) drug therapy: Secondary | ICD-10-CM | POA: Insufficient documentation

## 2015-02-02 DIAGNOSIS — Z87442 Personal history of urinary calculi: Secondary | ICD-10-CM | POA: Insufficient documentation

## 2015-02-02 DIAGNOSIS — Z91040 Latex allergy status: Secondary | ICD-10-CM | POA: Insufficient documentation

## 2015-02-02 DIAGNOSIS — Z862 Personal history of diseases of the blood and blood-forming organs and certain disorders involving the immune mechanism: Secondary | ICD-10-CM | POA: Insufficient documentation

## 2015-02-02 DIAGNOSIS — Y9302 Activity, running: Secondary | ICD-10-CM | POA: Insufficient documentation

## 2015-02-02 DIAGNOSIS — S92534A Nondisplaced fracture of distal phalanx of right lesser toe(s), initial encounter for closed fracture: Secondary | ICD-10-CM | POA: Insufficient documentation

## 2015-02-02 DIAGNOSIS — G8929 Other chronic pain: Secondary | ICD-10-CM | POA: Insufficient documentation

## 2015-02-02 DIAGNOSIS — Y998 Other external cause status: Secondary | ICD-10-CM | POA: Insufficient documentation

## 2015-02-02 MED ORDER — HYDROCODONE-ACETAMINOPHEN 5-325 MG PO TABS
1.0000 | ORAL_TABLET | ORAL | Status: DC | PRN
Start: 2015-02-02 — End: 2015-05-30

## 2015-02-02 NOTE — Discharge Instructions (Signed)
norco for severe pain as needed. Keep your foot elevated. Ice several times a day. Wear post op she and have your toes buddy taped at all times. Tylenol for pain. Norco for severe pain. Follow up with orthopedics if needed.   Toe Fracture A toe fracture is a break in one of the toe bones (phalanges). CAUSES This condition may be caused by:  Dropping a heavy object on your toe.  Stubbing your toe.  Overusing your toe or doing repetitive exercise.  Twisting or stretching your toe out of place. RISK FACTORS This condition is more likely to develop in people who:  Play contact sports.  Have a bone disease.  Have a low calcium level. SYMPTOMS The main symptoms of this condition are swelling and pain in the toe. The pain may get worse with standing or walking. Other symptoms include:  Bruising.  Stiffness.  Numbness.  A change in the way the toe looks.  Broken bones that poke through the skin.  Blood beneath the toenail. DIAGNOSIS This condition is diagnosed with a physical exam. You may also have X-rays. TREATMENT  Treatment for this condition depends on the type of fracture and its severity. Treatment may involve:  Taping the broken toe to a toe that is next to it (buddy taping). This is the most common treatment for fractures in which the bone has not moved out of place (nondisplaced fracture).  Wearing a shoe that has a wide, rigid sole to protect the toe and to limit its movement.  Wearing a walking cast.  Having a procedure to move the toe back into place.  Surgery. This may be needed:  If there are many pieces of broken bone that are out of place (displaced).  If the toe joint breaks.  If the bone breaks through the skin.  Physical therapy. This is done to help regain movement and strength in the toe. You may need follow-up X-rays to make sure that the bone is healing well and staying in position. HOME CARE INSTRUCTIONS If You Have a Cast:  Do not stick  anything inside the cast to scratch your skin. Doing that increases your risk of infection.  Check the skin around the cast every day. Report any concerns to your health care provider. You may put lotion on dry skin around the edges of the cast. Do not apply lotion to the skin underneath the cast.  Do not put pressure on any part of the cast until it is fully hardened. This may take several hours.  Keep the cast clean and dry. Bathing  Do not take baths, swim, or use a hot tub until your health care provider approves. Ask your health care provider if you can take showers. You may only be allowed to take sponge baths for bathing.  If your health care provider approves bathing and showering, cover the cast or bandage (dressing) with a watertight plastic bag to protect it from water. Do not let the cast or dressing get wet. Managing Pain, Stiffness, and Swelling  If you do not have a cast, apply ice to the injured area, if directed.  Put ice in a plastic bag.  Place a towel between your skin and the bag.  Leave the ice on for 20 minutes, 2-3 times per day.  Move your toes often to avoid stiffness and to lessen swelling.  Raise (elevate) the injured area above the level of your heart while you are sitting or lying down. Driving  Do not  drive or operate heavy machinery while taking pain medicine.  Do not drive while wearing a cast on a foot that you use for driving. Activity  Return to your normal activities as directed by your health care provider. Ask your health care provider what activities are safe for you.  Perform exercises daily as directed by your health care provider or physical therapist. Safety  Do not use the injured limb to support your body weight until your health care provider says that you can. Use crutches or other assistive devices as directed by your health care provider. General Instructions  If your toe was treated with buddy taping, follow your health care  provider's instructions for changing the gauze and tape. Change it more often:  The gauze and tape get wet. If this happens, dry the space between the toes.  The gauze and tape are too tight and cause your toe to become pale or numb.  Wear a protective shoe as directed by your health care provider. If you were not given a protective shoe, wear sturdy, supportive shoes. Your shoes should not pinch your toes and should not fit tightly against your toes.  Do not use any tobacco products, including cigarettes, chewing tobacco, or e-cigarettes. Tobacco can delay bone healing. If you need help quitting, ask your health care provider.  Take medicines only as directed by your health care provider.  Keep all follow-up visits as directed by your health care provider. This is important. SEEK MEDICAL CARE IF:  You have a fever.  Your pain medicine is not helping.  Your toe is cold.  Your toe is numb.  You still have pain after one week of rest and treatment.  You still have pain after your health care provider has said that you can start walking again.  You have pain, tingling, or numbness in your foot that is not going away. SEEK IMMEDIATE MEDICAL CARE IF:  You have severe pain.  You have redness or inflammation in your toe that is getting worse.  You have pain or numbness in your toe that is getting worse.  Your toe turns blue.   This information is not intended to replace advice given to you by your health care provider. Make sure you discuss any questions you have with your health care provider.   Document Released: 01/12/2000 Document Revised: 10/05/2014 Document Reviewed: 11/10/2013 Elsevier Interactive Patient Education [employer].

## 2015-02-02 NOTE — ED Provider Notes (Signed)
CSN: [id number]     Arrival date & time 02/02/15  2034 History  By signing my name below, I, Jolayne Panther, attest that this documentation has been prepared under the direction and in the presence of Malcome Ambrocio, PA-C. Marland Kitchen Electronically Signed: Jolayne Panther, Scribe. 02/02/2015. 10:54 PM.   Chief Complaint  Patient presents with  . Foot Injury   The history is provided by the patient. No language interpreter was used.    HPI Comments: [name] is a 45 y.o. female who presents to the Emergency Department complaining of constant, mild pain to her right second and third toe after hitting them on a table while running around her house last night. She notes that her pain radiates up into her right leg. Pt also reports that she has taken tylenol and ibuprofen with no relief. She has no other complaints at this time.  Past Medical History  Diagnosis Date  . Dysrhythmia     not followed by cardiogist  . Anemia   . Blood transfusion   . Seizures ([place])   . Headache(784.0)   . Depression   . Abdominal pain     takes omeprazole  . Hypertension     dr Huey Bienenstock  . Chronic back pain greater than 3 months duration   . Chronic neck pain   . Kidney stones    Past Surgical History  Procedure Laterality Date  . Abdominal hysterectomy    . Cesarean section      x2  . Abdominoplasty    . Cervical spine surgery      2002  . Shoulder surgery      left humeral head replacement 2001  . Breast surgery      breast implants x 2  . Cholecystectomy  02/28/2011    Procedure: LAPAROSCOPIC CHOLECYSTECTOMY;  Surgeon: Joyice Faster. Cornett, MD;  Location: [place];  Service: General;  Laterality: N/A;  Laparoscopic cholecystectomy.  . Ventral hernia repair  08/22/2011    Procedure: LAPAROSCOPIC VENTRAL HERNIA;  Surgeon: Joyice Faster. Cornett, MD;  Location: [place];  Service: General;  Laterality: N/A;  . Hernia repair  08/22/11    Ventral  . Back surgery     Family History  Problem  Relation Age of Onset  . Cancer Mother     colon, skin  . Anesthesia problems Mother   . Heart disease Father   . Cancer Maternal Aunt     OVARIAN  . Breast cancer Maternal Aunt    Social History  Substance Use Topics  . Smoking status: Never Smoker   . Smokeless tobacco: Never Used  . Alcohol Use: No   OB History    Gravida Para Term Preterm AB TAB SAB Ectopic Multiple Living   0              Review of Systems  Musculoskeletal: Positive for myalgias and arthralgias.       Right second and third toe.  Neurological: Negative for weakness and numbness.   Allergies  Latex and Naproxen  Home Medications   Prior to Admission medications   Medication Sig Start Date End Date Taking? Authorizing Provider  albuterol (PROVENTIL HFA;VENTOLIN HFA) 108 (90 BASE) MCG/ACT inhaler Inhale 1-2 puffs into the lungs every 6 (six) hours as needed for wheezing or shortness of breath. 01/28/14   Kaitlyn Szekalski, PA-C  amitriptyline (ELAVIL) 25 MG tablet Take 2 tablets (50 mg total) by mouth at bedtime. Patient taking differently: Take 12.5 mg by mouth  at bedtime.  10/21/13   Pieter Partridge, DO  cetirizine (ZYRTEC) 10 MG tablet Take 10 mg by mouth daily.    Historical Provider, MD  cyclobenzaprine (FLEXERIL) 10 MG tablet Take 1 tablet (10 mg total) by mouth 2 (two) times daily as needed for muscle spasms. 09/03/14   Evelina Bucy, MD  HYDROcodone-acetaminophen (NORCO/VICODIN) 5-325 MG per tablet Take 1 tablet by mouth every 6 (six) hours as needed for moderate pain. 09/03/14   Evelina Bucy, MD  HYDROcodone-homatropine [facility]) 5-1.5 MG/5ML syrup Take 5 mLs by mouth every 6 (six) hours as needed. Patient not taking: Reported on 03/27/2014 01/28/14   Alvina Chou, PA-C  ibuprofen (ADVIL,MOTRIN) 800 MG tablet Take 800 mg by mouth 2 (two) times daily as needed (pain).    Historical Provider, MD  lisinopril-hydrochlorothiazide (PRINZIDE,ZESTORETIC) 20-25 MG per tablet Take 1 tablet by mouth daily.      Historical Provider, MD  omeprazole (PRILOSEC) 20 MG capsule Take 20 mg by mouth daily.    Historical Provider, MD  ondansetron (ZOFRAN) 4 MG tablet Take 1 tablet (4 mg total) by mouth every 6 (six) hours. Patient not taking: Reported on 03/27/2014 09/15/13   Cleatrice Burke, PA-C  OxyCODONE HCl, Abuse Deter, (OXAYDO) 5 MG TABA Take 1 tablet by mouth every 8 (eight) hours as needed. 09/03/14   Evelina Bucy, MD  oxyCODONE-acetaminophen (PERCOCET/ROXICET) 5-325 MG per tablet Take 1 tablet by mouth every 4 (four) hours as needed for severe pain. May take 2 tablets PO q 6 hours for severe pain - Do not take with Tylenol as this tablet already contains tylenol Patient not taking: Reported on 03/27/2014 09/15/13   Cleatrice Burke, PA-C  traMADol (ULTRAM) 50 MG tablet Take 50 mg by mouth 2 (two) times daily as needed (pain).    Historical Provider, MD  Vitamin D, Ergocalciferol, (DRISDOL) 50000 UNITS CAPS capsule Take 50,000 Units by mouth every Friday.    Historical Provider, MD   BP 130/89 mmHg  Pulse 89  Temp(Src) 98.4 F (36.9 C) (Oral)  Resp 18  SpO2 100% Physical Exam  Constitutional: She is oriented to person, place, and time. She appears well-developed and well-nourished. No distress.  HENT:  Head: Normocephalic and atraumatic.  Eyes: Conjunctivae are normal.  Cardiovascular: Normal rate, regular rhythm and normal heart sounds.   Pulmonary/Chest: Effort normal and breath sounds normal.  Abdominal: She exhibits no distension.  Musculoskeletal:  Bruising and swelling noted to the right second and third toes as well as MTP joints. Tender to palpation over the toes and MTP joints. Normal ankle. Normal rest of the toes. Normal dorsal foot. Dorsal pedal pulse intact. Cap refill less than 2 seconds distally to the toes.  Neurological: She is alert and oriented to person, place, and time.  Skin: Skin is warm and dry.  Psychiatric: She has a normal mood and affect.  Nursing note and vitals  reviewed.   ED Course  Procedures  DIAGNOSTIC STUDIES:    Oxygen Saturation is 100% on RA, normal by my interpretation.   COORDINATION OF CARE:  10:27 PM Advise pt to ice foot and keep it elevated. Will buddy tape pt's toes together for stability and will discharge pt with orthopedic shoe. Discussed treatment plan with pt at bedside and pt agreed to plan.   Imaging Review Dg Foot Complete Right  02/02/2015  CLINICAL DATA:  Status post trauma to the right foot with pain in the second and third metatarsal area. EXAM: RIGHT FOOT COMPLETE - 3+  VIEW COMPARISON:  March 08, 2013 FINDINGS: There is nondisplaced fracture of the distal second proximal phalanx. There is no dislocation. Patient status post prior fixation of the first proximal phalanx unchanged. IMPRESSION: Fracture of distal second proximal phalanx. Electronically Signed   By: Abelardo Diesel M.D.   On: 02/02/2015 21:37   I have personally reviewed and evaluated these images as part of my medical decision-making.  MDM   Final diagnoses:  Toe fracture, right, closed, initial encounter    patient with fracture of the distal second proximal phalanx, nondisplaced. Buddy taped. Placed in a postop shoe. Crutches for comfort. Instructed to ice, elevate, pain medications, follow-up with orthopedics only as needed.  Filed Vitals:   02/02/15 2049 02/02/15 2305  BP: 130/89 111/78  Pulse: 89 84  Temp: 98.4 F (36.9 C)   TempSrc: Oral   Resp: 18 16  SpO2: 100% 100%     Jeannett Senior, PA-C 02/03/15 0538  Merrily Pew, MD 02/03/15 2307

## 2015-02-02 NOTE — ED Notes (Signed)
Running barefoot through the house. Hit right foot on bottom of table. Pain to right second and third toe. Bruising and swelling evident in triage.

## 2015-02-23 ENCOUNTER — Emergency Department (HOSPITAL_COMMUNITY)
Admission: EM | Admit: 2015-02-23 | Discharge: 2015-02-23 | Disposition: A | Discharge status: Home / Self Care | Payer: Self-pay | Attending: Emergency Medicine | Admitting: Emergency Medicine

## 2015-02-23 ENCOUNTER — Emergency Department (HOSPITAL_COMMUNITY): Payer: No Typology Code available for payment source

## 2015-02-23 ENCOUNTER — Encounter (HOSPITAL_COMMUNITY): Payer: Self-pay | Admitting: Emergency Medicine

## 2015-02-23 DIAGNOSIS — R630 Anorexia: Secondary | ICD-10-CM | POA: Insufficient documentation

## 2015-02-23 DIAGNOSIS — Z8659 Personal history of other mental and behavioral disorders: Secondary | ICD-10-CM | POA: Insufficient documentation

## 2015-02-23 DIAGNOSIS — Z91040 Latex allergy status: Secondary | ICD-10-CM | POA: Insufficient documentation

## 2015-02-23 DIAGNOSIS — Z87442 Personal history of urinary calculi: Secondary | ICD-10-CM | POA: Insufficient documentation

## 2015-02-23 DIAGNOSIS — Z79899 Other long term (current) drug therapy: Secondary | ICD-10-CM | POA: Insufficient documentation

## 2015-02-23 DIAGNOSIS — Z862 Personal history of diseases of the blood and blood-forming organs and certain disorders involving the immune mechanism: Secondary | ICD-10-CM | POA: Insufficient documentation

## 2015-02-23 DIAGNOSIS — I10 Essential (primary) hypertension: Secondary | ICD-10-CM | POA: Insufficient documentation

## 2015-02-23 DIAGNOSIS — R002 Palpitations: Secondary | ICD-10-CM | POA: Insufficient documentation

## 2015-02-23 DIAGNOSIS — G8929 Other chronic pain: Secondary | ICD-10-CM | POA: Insufficient documentation

## 2015-02-23 LAB — URINALYSIS, ROUTINE W REFLEX MICROSCOPIC
BILIRUBIN URINE: NEGATIVE
Glucose, UA: NEGATIVE mg/dL
Hgb urine dipstick: NEGATIVE
Ketones, ur: NEGATIVE mg/dL
Leukocytes, UA: NEGATIVE
NITRITE: NEGATIVE
PROTEIN: NEGATIVE mg/dL
Specific Gravity, Urine: 1.008 (ref 1.005–1.030)
pH: 5 (ref 5.0–8.0)

## 2015-02-23 LAB — CBC
HCT: 43.4 % (ref 36.0–46.0)
Hemoglobin: 13.9 g/dL (ref 12.0–15.0)
MCH: 32 pg (ref 26.0–34.0)
MCHC: 32 g/dL (ref 30.0–36.0)
MCV: 99.8 fL (ref 78.0–100.0)
PLATELETS: 296 10*3/uL (ref 150–400)
RBC: 4.35 MIL/uL (ref 3.87–5.11)
RDW: 13.2 % (ref 11.5–15.5)
WBC: 5.6 10*3/uL (ref 4.0–10.5)

## 2015-02-23 LAB — BASIC METABOLIC PANEL
ANION GAP: 10 (ref 5–15)
BUN: 12 mg/dL (ref 6–20)
CALCIUM: 9.7 mg/dL (ref 8.9–10.3)
CO2: 22 mmol/L (ref 22–32)
Chloride: 112 mmol/L — ABNORMAL HIGH (ref 101–111)
Creatinine, Ser: 0.66 mg/dL (ref 0.44–1.00)
Glucose, Bld: 100 mg/dL — ABNORMAL HIGH (ref 65–99)
Potassium: 3.9 mmol/L (ref 3.5–5.1)
SODIUM: 144 mmol/L (ref 135–145)

## 2015-02-23 LAB — CBG MONITORING, ED: GLUCOSE-CAPILLARY: 87 mg/dL (ref 65–99)

## 2015-02-23 LAB — TROPONIN I

## 2015-02-23 LAB — TSH: TSH: 0.868 u[IU]/mL (ref 0.350–4.500)

## 2015-02-23 NOTE — ED Notes (Signed)
Bed: [id number] Expected date:  Expected time:  Means of arrival:  Comments: Hold for triage 2

## 2015-02-23 NOTE — ED Notes (Addendum)
Per pt, states facial numbness for over a week-patient has multiple complaints

## 2015-02-23 NOTE — Discharge Instructions (Signed)
Palpitaciones  (Palpitations)  Es la sensación de sentir que el latido cardíaco es irregular o es más rápido que lo normal. Se siente como un aleteo o que falta un latido. Generalmente no es un problema grave. Sin embargo, en algunos casos podría ser necesario hacer más estudios diagnósticos.  CAUSAS   Las causas de las palpitaciones pueden ser:  · Fumar.  · El consumo de cafeína u otros estimulantes, como pastillas para adelgazar o bebidas energizantes.  · Alcohol.  · Situaciones de estrés y ansiedad.  · La actividad física extenuante.  · Fatiga.  · Algunos medicamentos.  · Enfermedad cardíaca, especialmente si tiene antecedentes de ritmo cardíaco irregular (arritmia), como fibrilación auricular, aleteo auricular o taquicardia supraventricular.  · El uso incorrecto de un marcapasos o desfibrilador.  DIAGNÓSTICO   Para hallar la causa de las palpitaciones, el médico le hará una historia clínica y un examen físico. El médico también puede hacerle un estudio llamado electrocardiograma (ECG) ambulatorio. El ECG registra el patrón de los latidos cardíacos durante un período de 24 horas. También pueden hacerle otros estudios, por ejemplo:  · Ecocardiograma transtorácico (ETT). Durante el ecocardiograma, se usan ondas sonoras para evaluar cómo fluye la sangre por el corazón.  · Ecocardiograma transesofágico (ETE).  · Monitoreo cardíaco. Este estudio permite que el médico controle la frecuencia y el ritmo cardíaco en tiempo real.  · Monitor Holter. Es un dispositivo portátil que registra los latidos cardíacos y ayuda a diagnosticar las arritmias cardíacas. Le permite al médico registrar la actividad cardíaca durante varios días, si es necesario.  · Pruebas de estrés por ejercicio o por medicamentos que aceleran los latidos cardíacos.  TRATAMIENTO   El tratamiento de las palpitaciones depende de la causa y puede variar mucho. En la mayoría de los casos no se requiere otro tratamiento que esperar, relajarse y el controlar  los síntomas. Otras causas, como la fibrilación auricular, el aleteo auricular o la taquicardia supraventricular generalmente requieren un tratamiento.  INSTRUCCIONES PARA EL CUIDADO EN EL HOGAR   · Evite:    Bebidas que contengan cafeína como el café, el té, los refrescos, las pastillas para adelgazar y las bebidas energizantes.    Chocolate.    Alcohol.  · Si fuma, abandone el hábito.  · Reduzca los niveles de estrés y ansiedad. Algunas cosas que pueden ayudarlo a relajarse son:    Un método para controlar el cuerpo con la mente, por ejemplo, controlar los latidos (biorregulación).    El yoga.    La meditación.    La actividad física como natación, trote o caminatas.  · Descanse y duerma lo suficiente.  SOLICITE ATENCIÓN MÉDICA SI:   · Continúa con latidos cardíacos rápidos o irregulares durante más de 24 horas.  · Las palpitaciones le suceden con más frecuencia.  SOLICITE ATENCIÓN MÉDICA DE INMEDIATO SI:  · Siente falta de aire o dolor en el pecho.  · Sufre un dolor intenso de cabeza.  · Se siente mareado o se desmaya.  ASEGÚRESE DE QUE:  · Comprende estas instrucciones.  · Controlará su afección.  · Recibirá ayuda de inmediato si no mejora o si empeora.     Esta información no tiene como fin reemplazar el consejo del médico. Asegúrese de hacerle al médico cualquier pregunta que tenga.     Document Released: 10/24/2004 Document Revised: 01/19/2013  Elsevier Interactive Patient Education ©2016 Elsevier Inc.

## 2015-02-23 NOTE — ED Provider Notes (Signed)
CSN: [id number]     Arrival date & time 02/23/15  1059 History   First MD Initiated Contact with Patient 02/23/15 1227     Chief Complaint  Patient presents with  . facial numbness       The history is provided by the patient.   patient presents with multiple complaints. States that over the last 2 years she's been having episodes where her heart will race. States he'll wake her up at night sometimes. States her come on at times she is walking. She states during these episodes she will have numbness in her hands and feet and face. She states the episodes of numbness did not come without the heart racing. She'll occasionally get headaches. She states she has lost some weight also. No fevers or chills. She states she was previously seen in [place] which is her home country diagnosed with cardiomegaly. States she was seen in the ER at [facility] and was told she did not have cardiomegaly. She is not seen a cardiologist in the [place]. No fevers or chills. She states she does feel as if food gets caught in her throat. No dysuria. No chest pain. Patient states she had a primary care doctor but could not longer afford Obamacare. She states she's getting get her insurance back in a week or 2.    Past Medical History  Diagnosis Date  . Dysrhythmia     not followed by cardiogist  . Anemia   . Blood transfusion   . Seizures ([place])   . Headache(784.0)   . Depression   . Abdominal pain     takes omeprazole  . Hypertension     dr Huey Bienenstock  . Chronic back pain greater than 3 months duration   . Chronic neck pain   . Kidney stones    Past Surgical History  Procedure Laterality Date  . Abdominal hysterectomy    . Cesarean section      x2  . Abdominoplasty    . Cervical spine surgery      2002  . Shoulder surgery      left humeral head replacement 2001  . Breast surgery      breast implants x 2  . Cholecystectomy  02/28/2011    Procedure: LAPAROSCOPIC CHOLECYSTECTOMY;  Surgeon: Joyice Faster.  Cornett, MD;  Location: [place];  Service: General;  Laterality: N/A;  Laparoscopic cholecystectomy.  . Ventral hernia repair  08/22/2011    Procedure: LAPAROSCOPIC VENTRAL HERNIA;  Surgeon: Joyice Faster. Cornett, MD;  Location: [place];  Service: General;  Laterality: N/A;  . Hernia repair  08/22/11    Ventral  . Back surgery     Family History  Problem Relation Age of Onset  . Cancer Mother     colon, skin  . Anesthesia problems Mother   . Heart disease Father   . Cancer Maternal Aunt     OVARIAN  . Breast cancer Maternal Aunt    Social History  Substance Use Topics  . Smoking status: Never Smoker   . Smokeless tobacco: Never Used  . Alcohol Use: No   OB History    Gravida Para Term Preterm AB TAB SAB Ectopic Multiple Living   0              Review of Systems  Constitutional: Positive for appetite change and unexpected weight change.  Respiratory: Negative for shortness of breath.   Cardiovascular: Positive for palpitations. Negative for chest pain and leg swelling.  Gastrointestinal: Negative  for abdominal pain.  Genitourinary: Negative for dysuria.  Musculoskeletal: Negative for back pain.  Neurological: Positive for numbness.      Allergies  Latex and Naproxen  Home Medications   Prior to Admission medications   Medication Sig Start Date End Date Taking? Authorizing Provider  cetirizine (ZYRTEC) 10 MG tablet Take 10 mg by mouth daily.   Yes Historical Provider, MD  HYDROcodone-acetaminophen (NORCO/VICODIN) 5-325 MG tablet Take 1 tablet by mouth every 4 (four) hours as needed. Patient taking differently: Take 1 tablet by mouth every 4 (four) hours as needed for moderate pain.  02/02/15  Yes Tatyana Kirichenko, PA-C  ibuprofen (ADVIL,MOTRIN) 800 MG tablet Take 800 mg by mouth 2 (two) times daily as needed (pain).   Yes Historical Provider, MD  losartan (COZAAR) 50 MG tablet Take 50 mg by mouth daily.   Yes Historical Provider, MD  omeprazole (PRILOSEC) 20 MG capsule Take 20  mg by mouth daily.   Yes Historical Provider, MD  albuterol (PROVENTIL HFA;VENTOLIN HFA) 108 (90 BASE) MCG/ACT inhaler Inhale 1-2 puffs into the lungs every 6 (six) hours as needed for wheezing or shortness of breath. Patient not taking: Reported on 02/23/2015 01/28/14   Alvina Chou, PA-C  amitriptyline (ELAVIL) 25 MG tablet Take 2 tablets (50 mg total) by mouth at bedtime. Patient not taking: Reported on 02/23/2015 10/21/13   Pieter Partridge, DO   BP 142/84 mmHg  Pulse 82  Temp(Src) 98.3 F (36.8 C) (Oral)  Resp 16  SpO2 100% Physical Exam  Constitutional: She appears well-developed and well-nourished.  HENT:  Head: Atraumatic.  Eyes: EOM are normal.  Neck: No thyromegaly present.  Cardiovascular: Normal rate and regular rhythm.   Pulmonary/Chest: Effort normal.  Abdominal: Soft.  Musculoskeletal: Normal range of motion.  Neurological: She is alert.  Skin: Skin is warm.  Psychiatric: She has a normal mood and affect.    ED Course  Procedures (including critical care time) Labs Review Labs Reviewed  BASIC METABOLIC PANEL - Abnormal; Notable for the following:    Chloride 112 (*)    Glucose, Bld 100 (*)    All other components within normal limits  CBC  URINALYSIS, ROUTINE W REFLEX MICROSCOPIC (NOT AT [facility])  TROPONIN I  TSH  CBG MONITORING, ED    Imaging Review Dg Chest 2 View  02/23/2015  CLINICAL DATA:  Dry cough for 1 month. Left chest pain for 1 week. Shortness of breath. EXAM: CHEST  2 VIEW COMPARISON:  Chest x-ray dated 09/03/2014. FINDINGS: Heart size is normal. Lungs are clear. Lung volumes are normal. No pleural effusions seen. No pneumothorax seen. There are chronic deformities of the left lateral ribs. Fixation hardware at the cervical thoracic junction appears stable in position. No acute osseous abnormality seen. Soft tissues about the chest are unremarkable. Cholecystectomy clips noted within the right upper abdomen. IMPRESSION: Lungs are clear and there is no  evidence of acute cardiopulmonary abnormality. Electronically Signed   By: Franki Cabot M.D.   On: 02/23/2015 13:06   I have personally reviewed and evaluated these images and lab results as part of my medical decision-making.   EKG Interpretation   Date/Time:  Thursday February 23 2015 11:19:44 EST Ventricular Rate:  87 PR Interval:  163 QRS Duration: 86 QT Interval:  372 QTC Calculation: 447 R Axis:   35 Text Interpretation:  Sinus rhythm Low voltage, precordial leads Baseline  wander in lead(s) V2 V3 V4 since last tracing no significant change  Confirmed by [facility]  MD, Vira Agar [phone]) on 02/23/2015 11:52:48 AM      MDM   Final diagnoses:  Heart palpitations    Patient with heart palpitations. Has numbness that time. May be an anxiety component. EKG and lab work reassuring. TSH reassuring. Will need to follow-up with her primary care doctor and was given the follow-up information for [facility] and wellness. Also cardiology follow-up.    Davonna Belling, MD 02/23/15 1524

## 2015-03-23 ENCOUNTER — Telehealth: Payer: Self-pay | Admitting: Cardiovascular Disease

## 2015-03-23 NOTE — Telephone Encounter (Signed)
Received records from [facility] for appointment on 05/02/15 with Dr Gwenlyn Found.  Records given to [facility] (medical records) for Dr Kennon Holter schedule on 05/02/15. lp

## 2015-04-25 ENCOUNTER — Encounter: Payer: Self-pay | Admitting: Cardiovascular Disease

## 2015-05-02 ENCOUNTER — Ambulatory Visit: Payer: Self-pay | Admitting: Cardiovascular Disease

## 2015-05-12 ENCOUNTER — Ambulatory Visit: Payer: No Typology Code available for payment source | Admitting: Cardiovascular Disease

## 2015-05-19 ENCOUNTER — Ambulatory Visit: Payer: BLUE CROSS/BLUE SHIELD | Admitting: Cardiovascular Disease

## 2015-05-30 ENCOUNTER — Encounter (HOSPITAL_COMMUNITY): Payer: Self-pay | Admitting: Cardiovascular Disease

## 2015-05-30 ENCOUNTER — Encounter: Payer: Self-pay | Admitting: Cardiovascular Disease

## 2015-05-30 ENCOUNTER — Ambulatory Visit (INDEPENDENT_AMBULATORY_CARE_PROVIDER_SITE_OTHER): Payer: BLUE CROSS/BLUE SHIELD | Admitting: Cardiovascular Disease

## 2015-05-30 VITALS — BP 114/80 | HR 92 | Ht 67.0 in | Wt 174.0 lb

## 2015-05-30 DIAGNOSIS — I10 Essential (primary) hypertension: Secondary | ICD-10-CM

## 2015-05-30 DIAGNOSIS — R0789 Other chest pain: Secondary | ICD-10-CM

## 2015-05-30 DIAGNOSIS — R0609 Other forms of dyspnea: Secondary | ICD-10-CM

## 2015-05-30 DIAGNOSIS — R079 Chest pain, unspecified: Secondary | ICD-10-CM

## 2015-05-30 NOTE — Assessment & Plan Note (Signed)
Patient complains of one year of atypical chest pain. It occurs when walking with radiation to her left breast and back. We will check a routine GXT. Her cardiac risk factor profile is notable forapprehension and family history.

## 2015-05-30 NOTE — Progress Notes (Signed)
05/30/2015 [place]   Jun 15, 1970  [id number]  Primary Physician Ginger Organ, MD Primary Cardiologist: Lorretta Harp MD Renae Gloss   HPI:  [name] is a 45 year old moderately overweight marriedLatino female mother of 2 children and works as a Counselling psychologist. She was referred by Dr. Carrolyn Meiers for cardiovascular evaluation because of dyspnea on exertion and atypical chest pain her risk factor profile is notable for treated hypertension and family history. Both parents apparently had myocardial infarctions. She has never had a heart attack or stroke. She has noted onset of dyspnea on exertion over the last 2 years and atypical chest pain over the last 12 months. The pain is left inframammary with radiation to her back.   Current Outpatient Prescriptions  Medication Sig Dispense Refill  . amitriptyline (ELAVIL) 25 MG tablet Take 2 tablets (50 mg total) by mouth at bedtime. 30 tablet 3  . cetirizine (ZYRTEC) 10 MG tablet Take 10 mg by mouth daily.    Marland Kitchen ibuprofen (ADVIL,MOTRIN) 800 MG tablet Take 200 mg by mouth 2 (two) times daily as needed (pain).     Marland Kitchen losartan (COZAAR) 50 MG tablet Take 50 mg by mouth daily.    Marland Kitchen omeprazole (PRILOSEC) 20 MG capsule Take 20 mg by mouth daily.     No current facility-administered medications for this visit.    Allergies  Allergen Reactions  . Latex Anaphylaxis  . Naproxen Anaphylaxis    Social History   Social History  . Marital Status: Legally Separated    Spouse Name: N/A  . Number of Children: N/A  . Years of Education: N/A   Occupational History  . Not on file.   Social History Main Topics  . Smoking status: Never Smoker   . Smokeless tobacco: Never Used  . Alcohol Use: No  . Drug Use: No  . Sexual Activity: Yes    Birth Control/ Protection: Surgical   Other Topics Concern  . Not on file   Social History Narrative     Review of Systems: General: negative for chills, fever, night sweats or  weight changes.  Cardiovascular: negative for chest pain, dyspnea on exertion, edema, orthopnea, palpitations, paroxysmal nocturnal dyspnea or shortness of breath Dermatological: negative for rash Respiratory: negative for cough or wheezing Urologic: negative for hematuria Abdominal: negative for nausea, vomiting, diarrhea, bright red blood per rectum, melena, or hematemesis Neurologic: negative for visual changes, syncope, or dizziness All other systems reviewed and are otherwise negative except as noted above.    Blood pressure 114/80, pulse 92, height 5\' 7"  (1.702 m), weight 174 lb (78.926 kg).  General appearance: alert and no distress Neck: no adenopathy, no carotid bruit, no JVD, supple, symmetrical, trachea midline and thyroid not enlarged, symmetric, no tenderness/mass/nodules Lungs: clear to auscultation bilaterally Heart: regular rate and rhythm, S1, S2 normal, no murmur, click, rub or gallop Extremities: extremities normal, atraumatic, no cyanosis or edema  EKG not performed today  ASSESSMENT AND PLAN:   Dyspnea on exertion Patient complains of 2 years of dyspnea on exertion. We will check a 2-D echocardiogram  Atypical chest pain Patient complains of one year of atypical chest pain. It occurs when walking with radiation to her left breast and back. We will check a routine GXT. Her cardiac risk factor profile is notable forapprehension and family history.  Essential hypertension history of hypertension blood pressure measured at 114/80. She is on losartan. Continue current meds at current dosing      Pearletha Forge.  Gwenlyn Found MD [facility], [facility] 05/30/2015 3:03 PM

## 2015-05-30 NOTE — Assessment & Plan Note (Signed)
history of hypertension blood pressure measured at 114/80. She is on losartan. Continue current meds at current dosing

## 2015-05-30 NOTE — Assessment & Plan Note (Signed)
Patient complains of 2 years of dyspnea on exertion. We will check a 2-D echocardiogram

## 2015-05-30 NOTE — Patient Instructions (Signed)
Medication Instructions:  Your physician recommends that you continue on your current medications as directed. Please refer to the Current Medication list given to you today.   Labwork: none  Testing/Procedures: Your physician has requested that you have an exercise tolerance test. For further information please visit [email or web address]. Please also follow instruction sheet, as given.  Your physician has requested that you have an echocardiogram. Echocardiography is a painless test that uses sound waves to create images of your heart. It provides your doctor with information about the size and shape of your heart and how well your heart's chambers and valves are working. This procedure takes approximately one hour. There are no restrictions for this procedure.    Follow-Up: Follow up with Dr. Gwenlyn Found as needed.   Any Other Special Instructions Will Be Listed Below (If Applicable).     If you need a refill on your cardiac medications before your next appointment, please call your pharmacy.

## 2015-06-08 ENCOUNTER — Telehealth (HOSPITAL_COMMUNITY): Payer: Self-pay

## 2015-06-08 NOTE — Telephone Encounter (Signed)
Encounter complete. 

## 2015-06-13 ENCOUNTER — Ambulatory Visit (HOSPITAL_COMMUNITY)
Admission: RE | Admit: 2015-06-13 | Discharge: 2015-06-13 | Disposition: A | Discharge status: Home / Self Care | Payer: BLUE CROSS/BLUE SHIELD | Source: Ambulatory Visit | Attending: Internal Medicine | Admitting: Internal Medicine

## 2015-06-13 ENCOUNTER — Ambulatory Visit (HOSPITAL_BASED_OUTPATIENT_CLINIC_OR_DEPARTMENT_OTHER)
Admission: RE | Admit: 2015-06-13 | Discharge: 2015-06-13 | Disposition: A | Discharge status: Home / Self Care | Payer: BLUE CROSS/BLUE SHIELD | Source: Ambulatory Visit | Attending: Cardiovascular Disease | Admitting: Cardiovascular Disease

## 2015-06-13 DIAGNOSIS — R0609 Other forms of dyspnea: Secondary | ICD-10-CM

## 2015-06-13 DIAGNOSIS — R079 Chest pain, unspecified: Secondary | ICD-10-CM | POA: Insufficient documentation

## 2015-06-13 DIAGNOSIS — Z8249 Family history of ischemic heart disease and other diseases of the circulatory system: Secondary | ICD-10-CM | POA: Insufficient documentation

## 2015-06-13 DIAGNOSIS — I10 Essential (primary) hypertension: Secondary | ICD-10-CM | POA: Diagnosis not present

## 2015-06-13 LAB — EXERCISE TOLERANCE TEST
Estimated workload: 4.8 METS
Exercise duration (min): 3 min
Exercise duration (sec): 11 s
MPHR: 175 {beats}/min
Peak HR: 105 {beats}/min
Percent HR: 60 %
RPE: 17
Rest HR: 70 {beats}/min

## 2015-06-15 ENCOUNTER — Telehealth: Payer: Self-pay

## 2015-06-15 DIAGNOSIS — R9431 Abnormal electrocardiogram [ECG] [EKG]: Secondary | ICD-10-CM

## 2015-06-15 NOTE — Telephone Encounter (Deleted)
-----   Message from Lorretta Harp, MD sent at 06/14/2015  1:29 PM EDT ----- Changed to pharmacologic Myoview stress test

## 2015-06-15 NOTE — Telephone Encounter (Signed)
-----   Message from Lorretta Harp, MD sent at 06/14/2015  1:29 PM EDT ----- Changed to pharmacologic Myoview stress test

## 2015-06-15 NOTE — Telephone Encounter (Signed)
Left message to call back Pt was unable to complete GXT so need LexiMyoview. Orders placed for myoview. Pt needs to be aware of GXT results and scheduled for Lexi.

## 2015-06-15 NOTE — Telephone Encounter (Signed)
Pt aware she will be contacted to setup Lexi.

## 2015-06-16 ENCOUNTER — Telehealth: Payer: Self-pay | Admitting: Student

## 2015-06-16 NOTE — Telephone Encounter (Signed)
Left message for patient to call back. She needs  to schedule a lexiscan.

## 2015-06-23 ENCOUNTER — Telehealth (HOSPITAL_COMMUNITY): Payer: Self-pay

## 2015-06-23 NOTE — Telephone Encounter (Signed)
Encounter complete. 

## 2015-06-27 ENCOUNTER — Telehealth (HOSPITAL_COMMUNITY): Payer: Self-pay

## 2015-06-27 NOTE — Telephone Encounter (Signed)
Encounter complete. 

## 2015-06-28 ENCOUNTER — Ambulatory Visit (HOSPITAL_COMMUNITY)
Admission: RE | Admit: 2015-06-28 | Discharge: 2015-06-28 | Disposition: A | Discharge status: Home / Self Care | Payer: BLUE CROSS/BLUE SHIELD | Source: Ambulatory Visit | Attending: Internal Medicine | Admitting: Internal Medicine

## 2015-06-28 DIAGNOSIS — R079 Chest pain, unspecified: Secondary | ICD-10-CM | POA: Insufficient documentation

## 2015-06-28 DIAGNOSIS — R5383 Other fatigue: Secondary | ICD-10-CM | POA: Insufficient documentation

## 2015-06-28 DIAGNOSIS — R0609 Other forms of dyspnea: Secondary | ICD-10-CM | POA: Insufficient documentation

## 2015-06-28 DIAGNOSIS — R42 Dizziness and giddiness: Secondary | ICD-10-CM | POA: Insufficient documentation

## 2015-06-28 DIAGNOSIS — R9431 Abnormal electrocardiogram [ECG] [EKG]: Secondary | ICD-10-CM

## 2015-06-28 DIAGNOSIS — I10 Essential (primary) hypertension: Secondary | ICD-10-CM | POA: Diagnosis not present

## 2015-06-28 DIAGNOSIS — Z8249 Family history of ischemic heart disease and other diseases of the circulatory system: Secondary | ICD-10-CM | POA: Diagnosis not present

## 2015-06-28 LAB — MYOCARDIAL PERFUSION IMAGING
CHL CUP NUCLEAR SSS: 2
CSEPPHR: 96 {beats}/min
LV dias vol: 104 mL (ref 46–106)
LVSYSVOL: 41 mL
NUC STRESS TID: 1.09
Rest HR: 88 {beats}/min
SDS: 2
SRS: 0

## 2015-06-28 MED ORDER — TECHNETIUM TC 99M TETROFOSMIN IV KIT
10.7000 | PACK | Freq: Once | INTRAVENOUS | Status: AC | PRN
  Administered 2015-06-28: 10.7 via INTRAVENOUS
  Filled 2015-06-28: qty 11

## 2015-06-28 MED ORDER — AMINOPHYLLINE 25 MG/ML IV SOLN
75.0000 mg | Freq: Once | INTRAVENOUS | Status: AC
  Administered 2015-06-28: 75 mg via INTRAVENOUS

## 2015-06-28 MED ORDER — TECHNETIUM TC 99M TETROFOSMIN IV KIT
30.2000 | PACK | Freq: Once | INTRAVENOUS | Status: AC | PRN
  Administered 2015-06-28: 30.2 via INTRAVENOUS
  Filled 2015-06-28: qty 30

## 2015-06-28 MED ORDER — REGADENOSON 0.4 MG/5ML IV SOLN
0.4000 mg | Freq: Once | INTRAVENOUS | Status: AC
  Administered 2015-06-28: 0.4 mg via INTRAVENOUS

## 2015-07-04 ENCOUNTER — Telehealth: Payer: Self-pay | Admitting: Cardiovascular Disease

## 2015-07-04 NOTE — Telephone Encounter (Signed)
New message     Pt stated she is returning call for rn Ovid Curd) he left a vm

## 2015-07-04 NOTE — Telephone Encounter (Signed)
Spoke with pt, aware of nuclear stress test results.

## 2015-07-25 ENCOUNTER — Emergency Department (HOSPITAL_COMMUNITY): Payer: BLUE CROSS/BLUE SHIELD

## 2015-07-25 ENCOUNTER — Emergency Department (HOSPITAL_COMMUNITY)
Admission: EM | Admit: 2015-07-25 | Discharge: 2015-07-25 | Disposition: A | Discharge status: Home / Self Care | Payer: BLUE CROSS/BLUE SHIELD | Attending: Emergency Medicine | Admitting: Emergency Medicine

## 2015-07-25 ENCOUNTER — Encounter (HOSPITAL_COMMUNITY): Payer: Self-pay | Admitting: Emergency Medicine

## 2015-07-25 DIAGNOSIS — I10 Essential (primary) hypertension: Secondary | ICD-10-CM | POA: Diagnosis not present

## 2015-07-25 DIAGNOSIS — F329 Major depressive disorder, single episode, unspecified: Secondary | ICD-10-CM | POA: Insufficient documentation

## 2015-07-25 DIAGNOSIS — R202 Paresthesia of skin: Secondary | ICD-10-CM | POA: Diagnosis not present

## 2015-07-25 DIAGNOSIS — Z79899 Other long term (current) drug therapy: Secondary | ICD-10-CM | POA: Diagnosis not present

## 2015-07-25 DIAGNOSIS — R519 Headache, unspecified: Secondary | ICD-10-CM

## 2015-07-25 DIAGNOSIS — R51 Headache: Secondary | ICD-10-CM | POA: Insufficient documentation

## 2015-07-25 LAB — CBC WITH DIFFERENTIAL/PLATELET
BASOS PCT: 1 %
Basophils Absolute: 0.1 10*3/uL (ref 0.0–0.1)
EOS ABS: 0.1 10*3/uL (ref 0.0–0.7)
EOS PCT: 1 %
HCT: 39.8 % (ref 36.0–46.0)
HEMOGLOBIN: 13.3 g/dL (ref 12.0–15.0)
LYMPHS ABS: 1.7 10*3/uL (ref 0.7–4.0)
Lymphocytes Relative: 27 %
MCH: 31.7 pg (ref 26.0–34.0)
MCHC: 33.4 g/dL (ref 30.0–36.0)
MCV: 94.8 fL (ref 78.0–100.0)
MONO ABS: 0.6 10*3/uL (ref 0.1–1.0)
MONOS PCT: 9 %
NEUTROS PCT: 62 %
Neutro Abs: 3.9 10*3/uL (ref 1.7–7.7)
PLATELETS: 273 10*3/uL (ref 150–400)
RBC: 4.2 MIL/uL (ref 3.87–5.11)
RDW: 13.2 % (ref 11.5–15.5)
WBC: 6.3 10*3/uL (ref 4.0–10.5)

## 2015-07-25 LAB — BASIC METABOLIC PANEL
Anion gap: 6 (ref 5–15)
BUN: 13 mg/dL (ref 6–20)
CALCIUM: 8.9 mg/dL (ref 8.9–10.3)
CO2: 22 mmol/L (ref 22–32)
CREATININE: 0.54 mg/dL (ref 0.44–1.00)
Chloride: 110 mmol/L (ref 101–111)
GFR calc non Af Amer: >60 mL/min (ref >60)
Glucose, Bld: 83 mg/dL (ref 65–99)
Potassium: 3.7 mmol/L (ref 3.5–5.1)
Sodium: 138 mmol/L (ref 135–145)

## 2015-07-25 MED ORDER — SODIUM CHLORIDE 0.9 % IV BOLUS (SEPSIS)
1000.0000 mL | Freq: Once | INTRAVENOUS | Status: AC
  Administered 2015-07-25: 1000 mL via INTRAVENOUS

## 2015-07-25 MED ORDER — DIPHENHYDRAMINE HCL 50 MG/ML IJ SOLN
25.0000 mg | Freq: Once | INTRAMUSCULAR | Status: AC
  Administered 2015-07-25: 25 mg via INTRAVENOUS
  Filled 2015-07-25: qty 1

## 2015-07-25 MED ORDER — METOCLOPRAMIDE HCL 5 MG/ML IJ SOLN
10.0000 mg | Freq: Once | INTRAMUSCULAR | Status: AC
  Administered 2015-07-25: 10 mg via INTRAVENOUS
  Filled 2015-07-25: qty 2

## 2015-07-25 NOTE — Discharge Instructions (Signed)
Dolor de cabeza general sin causa (General Headache Without Cause) El dolor de cabeza es un dolor o malestar que se siente en la zona de la cabeza o del cuello. Puede no tener una causa especfica. Hay muchas causas y tipos de dolores de [place]. Los dolores de cabeza ms comunes son los siguientes:  Cefalea tensional.  Cefaleas migraosas.  Cefalea en brotes.  Cefaleas diarias crnicas. INSTRUCCIONES PARA EL CUIDADO EN EL HOGAR  Controle su afeccin para ver si hay cambios. Siga estos pasos para Aeronautical engineer afeccin: Control del [employer] medicamentos de venta libre y los recetados solamente como se lo haya indicado el mdico.  Cuando sienta dolor de cabeza acustese en un cuarto oscuro y tranquilo.  Si se lo indican, aplique hielo sobre la cabeza y la zona del cuello:  Ponga el hielo en una bolsa plstica.  Coloque una toalla entre la piel y la bolsa de hielo.  Coloque el hielo durante 46minutos, 2 a 3veces por Training and development officer.  Utilice una almohadilla trmica o tome una ducha con agua caliente para aplicar calor en la cabeza y la zona del cuello como se lo haya indicado el [place] luces tenues si le [employer] luces brillantes o sus dolores de cabeza empeoran. Comida y bebida  Mantenga un horario para las comidas.  Limite el consumo de bebidas alcohlicas.  Consuma menos cantidad de cafena o deje de tomarla. Instrucciones generales  Concurra a todas las visitas de control como se lo haya indicado el mdico. Esto es importante.  Lleve un diario de los dolores de cabeza para Neurosurgeon qu factores pueden desencadenarlos. Por ejemplo, escriba los siguientes datos:  Lo que usted come y Buyer, retail.  Cunto tiempo duerme.  Algn cambio en su dieta o en los medicamentos.  Pruebe algunas tcnicas de relajacin, como los [place].  Limite el estrs.  Sintese con la espalda recta y no tense los msculos.  No consuma productos que contengan tabaco, incluidos  cigarrillos, tabaco de Higher education careers adviser o cigarrillos electrnicos. Si necesita ayuda para dejar de fumar, consulte al mdico.  Haga actividad fsica habitualmente como se lo haya indicado el mdico.  Tenga un horario fijo para dormir. Duerma entre 7 y 9horas o la cantidad de horas que le haya recomendado el mdico. SOLICITE ATENCIN MDICA SI:   Los medicamentos no Dealer los sntomas.  Tiene un dolor de cabeza que es diferente del dolor de cabeza habitual.  Tiene nuseas o vmitos.  Tiene fiebre. SOLICITE ATENCIN MDICA DE INMEDIATO SI:   El dolor se hace cada vez ms intenso.  Ha vomitado repetidas veces.  Presenta rigidez en el cuello.  Sufre prdida de la visin.  Tiene problemas para hablar.  Siente dolor en el ojo o en el odo.  Presenta debilidad muscular o prdida del control muscular.  Pierde el equilibrio o tiene problemas para Writer.  Sufre mareos o se desmaya.  Se siente confundido.   Esta informacin no tiene Marine scientist el consejo del mdico. Asegrese de hacerle al mdico cualquier pregunta que tenga.   Document Released: 10/24/2004 Document Revised: 10/05/2014 Elsevier Interactive Patient Education 2016 [place] (Paresthesia) La parestesia es una sensacin de ardor o picazn fuera de lo normal que, en general, aparece en las manos, los brazos, las piernas o los pies. Sin embargo, puede Building surveyor parte del cuerpo. Por lo general, no es dolorosa. La sensacin puede describirse con los siguientes trminos:  Hormigueo o adormecimiento.  Pinchazos.  Algo que trepa por la piel.  Vibracin.  Extremidades que se duermen.  Picazn. La mayora de las personas sienten parestesia temporal en algn momento de la vida. La parestesia puede producirse al respirar muy rpido (hiperventilacin). Tambin puede aparecer sin causa aparente. Es comn que haya parestesia al hacer presin sobre un nervio, pero la sensacin  desaparece rpidamente al quitar la presin. Sin embargo, para Occupational hygienist parestesia es un problema de larga duracin (crnico) causado por un trastorno preexistente. Si sigue teniendo parestesia, es posible que necesite evaluacin mdica adicional. INSTRUCCIONES PARA EL CUIDADO EN EL HOGAR Controle su afeccin para ver si hay cambios. Las siguientes medidas pueden servir para Writer molestia que sienta:  Evite el consumo de alcohol.  Pruebe con masajes o acupuntura como ayuda para [employer] sntomas.  Concurra a todas las visitas de control como se lo haya indicado el mdico. Esto es importante. SOLICITE ATENCIN MDICA SI:  Contina teniendo episodios de parestesia.  La sensacin de ardor o picazn empeora al caminar.  Tiene dolor, calambres o mareos.  Le aparece una erupcin cutnea. SOLICITE ATENCIN MDICA DE INMEDIATO SI:  Se siente dbil.  Tiene dificultad para caminar o moverse.  Tiene problemas con el habla, la comprensin o la visin.  Se siente confundido.  No puede controlar la vejiga o los intestinos.  Siente adormecimiento despus de una lesin.  Se desmaya.   Esta informacin no tiene Marine scientist el consejo del mdico. Asegrese de hacerle al mdico cualquier pregunta que tenga.   Document Released: 05/02/2008 Document Revised: 05/31/2014 Elsevier Interactive Patient Education [employer].

## 2015-07-25 NOTE — ED Provider Notes (Signed)
CSN: [id number]     Arrival date & time 07/25/15  1125 History   First MD Initiated Contact with Patient 07/25/15 1232     Chief Complaint  Patient presents with  . Headache     The history is provided by the patient. No language interpreter was used.   [name] is a 45 y.o. female who presents to the Emergency Department complaining of headache.  She reports HA that started one week ago, gradual onset. Headache is located on the left side of the face and head and down the left lateral neck. Pain is described as a constant sharp pain that occasionally improves with ibuprofen only to return. She has some associated photophobia and decreased sensation to the left side of her face. She feels slightly weak on the left side of her body. This is very different from her usual migraine headaches.  Past Medical History  Diagnosis Date  . Dysrhythmia     not followed by cardiogist  . Anemia   . Blood transfusion   . Seizures ([place])   . Headache(784.0)   . Depression   . Abdominal pain     takes omeprazole  . Hypertension     dr Huey Bienenstock  . Chronic back pain greater than 3 months duration   . Chronic neck pain   . Kidney stones   . Atypical chest pain   . Dyspnea on exertion    Past Surgical History  Procedure Laterality Date  . Abdominal hysterectomy    . Cesarean section      x2  . Abdominoplasty    . Cervical spine surgery      2002  . Shoulder surgery      left humeral head replacement 2001  . Breast surgery      breast implants x 2  . Cholecystectomy  02/28/2011    Procedure: LAPAROSCOPIC CHOLECYSTECTOMY;  Surgeon: Joyice Faster. Cornett, MD;  Location: [place];  Service: General;  Laterality: N/A;  Laparoscopic cholecystectomy.  . Ventral hernia repair  08/22/2011    Procedure: LAPAROSCOPIC VENTRAL HERNIA;  Surgeon: Joyice Faster. Cornett, MD;  Location: [place];  Service: General;  Laterality: N/A;  . Hernia repair  08/22/11    Ventral  . Back surgery     Family History   Problem Relation Age of Onset  . Cancer Mother     colon, skin  . Anesthesia problems Mother   . Heart disease Father   . Cancer Maternal Aunt     OVARIAN  . Breast cancer Maternal Aunt    Social History  Substance Use Topics  . Smoking status: Never Smoker   . Smokeless tobacco: Never Used  . Alcohol Use: No   OB History    Gravida Para Term Preterm AB TAB SAB Ectopic Multiple Living   0              Review of Systems  All other systems reviewed and are negative.     Allergies  Latex and Naproxen  Home Medications   Prior to Admission medications   Medication Sig Start Date End Date Taking? Authorizing Provider  cetirizine (ZYRTEC) 10 MG tablet Take 10 mg by mouth daily.   Yes Historical Provider, MD  ibuprofen (ADVIL,MOTRIN) 200 MG tablet Take 400 mg by mouth every 6 (six) hours as needed (For pain.).   Yes Historical Provider, MD  losartan (COZAAR) 50 MG tablet Take 50 mg by mouth daily.   Yes Historical Provider, MD  omeprazole (PRILOSEC) 20 MG capsule Take 20 mg by mouth daily.   Yes Historical Provider, MD   BP 123/72 mmHg  Pulse 60  Temp(Src) 98.6 F (37 C) (Oral)  Resp 16  SpO2 100% Physical Exam  Constitutional: She is oriented to person, place, and time. She appears well-developed and well-nourished.  HENT:  Head: Normocephalic and atraumatic.  Mouth/Throat: No oropharyngeal exudate.  TMs clear bilaterally  Eyes:  Pupils equal round and reactive to light. There is mild conjunctival injection in the left eye. EOMI.  Cardiovascular: Normal rate and regular rhythm.   No murmur heard. Pulmonary/Chest: Effort normal and breath sounds normal. No respiratory distress.  Abdominal: Soft. There is no tenderness. There is no rebound and no guarding.  Musculoskeletal: She exhibits no edema or tenderness.  Lymphadenopathy:    She has no cervical adenopathy.  Neurological: She is alert and oriented to person, place, and time.  Decreased sensation to light  touch throughout the left lower face. No facial asymmetry. 5 out of 5 strength in all 4 extremities.  Skin: Skin is warm and dry.  Psychiatric: She has a normal mood and affect. Her behavior is normal.  Nursing note and vitals reviewed.   ED Course  Procedures (including critical care time) Labs Review Labs Reviewed  BASIC METABOLIC PANEL  CBC WITH DIFFERENTIAL/PLATELET    Imaging Review Ct Head Wo Contrast  07/25/2015  CLINICAL DATA:  Headaches for 1 week on the left side. EXAM: CT HEAD WITHOUT CONTRAST TECHNIQUE: Contiguous axial images were obtained from the base of the skull through the vertex without intravenous contrast. COMPARISON:  09/03/2014 FINDINGS: No acute intracranial abnormality. Specifically, no hemorrhage, hydrocephalus, mass lesion, acute infarction, or significant intracranial injury. No acute calvarial abnormality. Visualized paranasal sinuses and mastoids clear. Orbital soft tissues unremarkable. IMPRESSION: Negative. Electronically Signed   By: Rolm Baptise M.D.   On: 07/25/2015 14:01   I have personally reviewed and evaluated these images and lab results as part of my medical decision-making.   EKG Interpretation None      MDM   Final diagnoses:  Acute intractable headache, unspecified headache type  Paresthesia  Patient here for evaluation of left-sided headache and decreased sensation on light touch throughout her left face. She has a nonfocal neurologic examination other than her subjective decreased sensation to light touch throughout the left face. Her headache resolved following treatment in the department. Discussed with patient unclear source of paresthesias. Referred to neurology for further follow-up. Presentation is not consistent with temporal arteritis, subarachnoid hemorrhage, CVA.  Quintella Reichert, MD 07/25/15 1622

## 2015-07-25 NOTE — ED Notes (Addendum)
Patient presents for HA, nasal "numbness" and photophobia x1 week. Denies N/V, lightheadedness, dizziness, double or blurred vision. A&O x4, ambulatory with steady gait.

## 2015-10-26 ENCOUNTER — Other Ambulatory Visit: Payer: Self-pay | Admitting: Orthopedic Surgery

## 2015-10-26 DIAGNOSIS — M25519 Pain in unspecified shoulder: Secondary | ICD-10-CM

## 2015-10-31 ENCOUNTER — Ambulatory Visit
Admission: RE | Admit: 2015-10-31 | Discharge: 2015-10-31 | Disposition: A | Discharge status: Home / Self Care | Payer: PRIVATE HEALTH INSURANCE | Source: Ambulatory Visit | Attending: Orthopedic Surgery | Admitting: Orthopedic Surgery

## 2015-10-31 DIAGNOSIS — M25519 Pain in unspecified shoulder: Secondary | ICD-10-CM

## 2015-11-22 ENCOUNTER — Other Ambulatory Visit: Payer: Self-pay | Admitting: Orthopedic Surgery

## 2015-11-23 ENCOUNTER — Encounter (HOSPITAL_COMMUNITY): Payer: Self-pay

## 2015-11-23 NOTE — Pre-Procedure Instructions (Signed)
[name]  11/23/2015      [facility] [address] [place], [place] [address] Phone: [phone] Fax: [phone]    Your procedure is scheduled on Tuesday, November 7.  Report to [facility] Admitting at 5:30 AM                 Your surgery or procedure is scheduled for 7:30 AM   Call this number if you have problems the morning of surgery:[phone]                For any other questions, please call [phone], Monday - Friday 8 AM - 4 PM.   Remember:  Do not eat food or drink liquids after midnight Monday, November 6.  Take these medicines the morning of surgery with A SIP OF WATER: pantoprazole (PROTONIX). May use eye drops. May take tiZANidine (ZANAFLEX),  ALPRAZolam Duanne Moron),  topiramate (TOPAMAX).                [place]- Preparing For Surgery  Before surgery, you can play an important role. Because skin is not sterile, your skin needs to be as free of germs as possible. You can reduce the number of germs on your skin by washing with CHG (chlorahexidine gluconate) Soap before surgery.  CHG is an antiseptic cleaner which kills germs and bonds with the skin to continue killing germs even after washing.  Please do not use if you have an allergy to CHG or antibacterial soaps. If your skin becomes reddened/irritated stop using the CHG.  Do not shave (including legs and underarms) for at least 48 hours prior to first CHG shower. It is OK to shave your face.  Please follow these instructions carefully.   1. Shower the NIGHT BEFORE SURGERY and the MORNING OF SURGERY with CHG.   2. If you chose to wash your hair, wash your hair first as usual with your normal shampoo.  3. After you shampoo, rinse your hair and body thoroughly to remove the shampoo.  4. Use CHG as you would any other liquid soap. You can apply CHG directly to the skin and wash gently with a scrungie or a clean  washcloth.   5. Apply the CHG Soap to your body ONLY FROM THE NECK DOWN.  Do not use on open wounds or open sores. Avoid contact with your eyes, ears, mouth and genitals (private parts). Wash genitals (private parts) with your normal soap.  6. Wash thoroughly, paying special attention to the area where your surgery will be performed.  7. Thoroughly rinse your body with warm water from the neck down.  8. DO NOT shower/wash with your normal soap after using and rinsing off the CHG Soap.  9. Pat yourself dry with a CLEAN TOWEL.   10. Wear CLEAN PAJAMAS   11. Place CLEAN SHEETS on your bed the night of your first shower and DO NOT SLEEP WITH PETS.  Day of Surgery: Do not apply any deodorants/lotions. Please wear clean clothes to the hospital/surgery center.     Do not wear jewelry, make-up or nail polish.  Do not wear lotions, powders, or perfumes, or deodorant.  Do not shave 48 hours prior to surgery.   Do not bring valuables to the hospital.  [facility] is not responsible for any belongings or valuables.  Contacts, dentures or bridgework may not be worn into surgery.  Leave your suitcase in  the car.  After surgery it may be brought to your room.  For patients admitted to the hospital, discharge time will be determined by your treatment team.  Patients discharged the day of surgery will not be allowed to drive home.   Name and phone number of your driver:  - Special instructions:  -   Please read over the following fact sheets that you were given:  [facility]- Preparing For Surgery and Patient Instructions for Mupirocin Application, Incentive Spirometry, Pain Booklet

## 2015-11-24 ENCOUNTER — Encounter (HOSPITAL_COMMUNITY): Payer: Self-pay

## 2015-11-24 ENCOUNTER — Encounter (HOSPITAL_COMMUNITY)
Admission: RE | Admit: 2015-11-24 | Discharge: 2015-11-24 | Disposition: A | Discharge status: Home / Self Care | Payer: BLUE CROSS/BLUE SHIELD | Source: Ambulatory Visit | Attending: Orthopedic Surgery | Admitting: Orthopedic Surgery

## 2015-11-24 ENCOUNTER — Other Ambulatory Visit (HOSPITAL_COMMUNITY): Payer: Self-pay | Admitting: *Deleted

## 2015-11-24 DIAGNOSIS — D649 Anemia, unspecified: Secondary | ICD-10-CM | POA: Diagnosis not present

## 2015-11-24 DIAGNOSIS — I499 Cardiac arrhythmia, unspecified: Secondary | ICD-10-CM | POA: Insufficient documentation

## 2015-11-24 DIAGNOSIS — F329 Major depressive disorder, single episode, unspecified: Secondary | ICD-10-CM | POA: Insufficient documentation

## 2015-11-24 DIAGNOSIS — R569 Unspecified convulsions: Secondary | ICD-10-CM | POA: Diagnosis not present

## 2015-11-24 DIAGNOSIS — I10 Essential (primary) hypertension: Secondary | ICD-10-CM | POA: Diagnosis not present

## 2015-11-24 DIAGNOSIS — Z9889 Other specified postprocedural states: Secondary | ICD-10-CM | POA: Insufficient documentation

## 2015-11-24 DIAGNOSIS — N200 Calculus of kidney: Secondary | ICD-10-CM | POA: Insufficient documentation

## 2015-11-24 DIAGNOSIS — Z01812 Encounter for preprocedural laboratory examination: Secondary | ICD-10-CM | POA: Insufficient documentation

## 2015-11-24 HISTORY — DX: Dyspnea, unspecified: R06.00

## 2015-11-24 LAB — CBC
HCT: 42.1 % (ref 36.0–46.0)
HEMOGLOBIN: 13.8 g/dL (ref 12.0–15.0)
MCH: 32.2 pg (ref 26.0–34.0)
MCHC: 32.8 g/dL (ref 30.0–36.0)
MCV: 98.4 fL (ref 78.0–100.0)
Platelets: 293 10*3/uL (ref 150–400)
RBC: 4.28 MIL/uL (ref 3.87–5.11)
RDW: 13.8 % (ref 11.5–15.5)
WBC: 6.1 10*3/uL (ref 4.0–10.5)

## 2015-11-24 LAB — BASIC METABOLIC PANEL
ANION GAP: 8 (ref 5–15)
BUN: 18 mg/dL (ref 6–20)
CALCIUM: 9.3 mg/dL (ref 8.9–10.3)
CO2: 21 mmol/L — ABNORMAL LOW (ref 22–32)
Chloride: 110 mmol/L (ref 101–111)
Creatinine, Ser: 0.79 mg/dL (ref 0.44–1.00)
GLUCOSE: 80 mg/dL (ref 65–99)
Potassium: 3.6 mmol/L (ref 3.5–5.1)
SODIUM: 139 mmol/L (ref 135–145)

## 2015-11-24 LAB — SURGICAL PCR SCREEN
MRSA, PCR: NEGATIVE
STAPHYLOCOCCUS AUREUS: NEGATIVE

## 2015-11-24 NOTE — Progress Notes (Signed)
PCP - Kristie Cowman Cardiologist - [place]  Chest x-ray - 02/23/15 EKG - 03/20/15 normal Stress Test - 08/28/15 ECHO - 06/13/15 Cardiac Cath - denies  Patient speaks Very good English no interpreter needed  Will send to anesthesia for review of ECHO and Stress test     Patient denies shortness of breath, fever, cough and chest pain at PAT appointment
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
## 2015-12-05 ENCOUNTER — Encounter (HOSPITAL_COMMUNITY)
Admission: AD | Disposition: A | Discharge status: Home / Self Care | Payer: Self-pay | Source: Ambulatory Visit | Attending: Orthopedic Surgery

## 2015-12-05 ENCOUNTER — Ambulatory Visit (HOSPITAL_COMMUNITY): Payer: BLUE CROSS/BLUE SHIELD | Admitting: Vascular Surgery

## 2015-12-05 ENCOUNTER — Inpatient Hospital Stay (HOSPITAL_COMMUNITY): Payer: BLUE CROSS/BLUE SHIELD

## 2015-12-05 ENCOUNTER — Ambulatory Visit (HOSPITAL_COMMUNITY): Payer: BLUE CROSS/BLUE SHIELD | Admitting: Certified Registered Nurse Anesthetist

## 2015-12-05 ENCOUNTER — Inpatient Hospital Stay (HOSPITAL_COMMUNITY)
Admission: AD | Admit: 2015-12-05 | Discharge: 2015-12-07 | DRG: 483 | Disposition: A | Discharge status: Home / Self Care | Payer: BLUE CROSS/BLUE SHIELD | Source: Ambulatory Visit | Attending: Orthopedic Surgery | Admitting: Orthopedic Surgery

## 2015-12-05 ENCOUNTER — Encounter (HOSPITAL_COMMUNITY): Payer: Self-pay | Admitting: Certified Registered Nurse Anesthetist

## 2015-12-05 DIAGNOSIS — M19112 Post-traumatic osteoarthritis, left shoulder: Secondary | ICD-10-CM | POA: Diagnosis present

## 2015-12-05 DIAGNOSIS — Z79899 Other long term (current) drug therapy: Secondary | ICD-10-CM

## 2015-12-05 DIAGNOSIS — M7502 Adhesive capsulitis of left shoulder: Secondary | ICD-10-CM | POA: Diagnosis present

## 2015-12-05 DIAGNOSIS — Z91040 Latex allergy status: Secondary | ICD-10-CM

## 2015-12-05 DIAGNOSIS — Z9882 Breast implant status: Secondary | ICD-10-CM | POA: Diagnosis not present

## 2015-12-05 DIAGNOSIS — M12512 Traumatic arthropathy, left shoulder: Secondary | ICD-10-CM | POA: Diagnosis present

## 2015-12-05 DIAGNOSIS — Z96619 Presence of unspecified artificial shoulder joint: Secondary | ICD-10-CM

## 2015-12-05 DIAGNOSIS — Z888 Allergy status to other drugs, medicaments and biological substances status: Secondary | ICD-10-CM | POA: Diagnosis not present

## 2015-12-05 DIAGNOSIS — I10 Essential (primary) hypertension: Secondary | ICD-10-CM | POA: Diagnosis present

## 2015-12-05 DIAGNOSIS — R569 Unspecified convulsions: Secondary | ICD-10-CM | POA: Diagnosis present

## 2015-12-05 DIAGNOSIS — Z96612 Presence of left artificial shoulder joint: Secondary | ICD-10-CM

## 2015-12-05 DIAGNOSIS — F418 Other specified anxiety disorders: Secondary | ICD-10-CM | POA: Diagnosis present

## 2015-12-05 DIAGNOSIS — Z90710 Acquired absence of both cervix and uterus: Secondary | ICD-10-CM

## 2015-12-05 HISTORY — DX: Post-traumatic osteoarthritis, left shoulder: M19.112

## 2015-12-05 HISTORY — PX: TOTAL SHOULDER ARTHROPLASTY: SHX126

## 2015-12-05 SURGERY — ARTHROPLASTY, SHOULDER, TOTAL
Anesthesia: General | Laterality: Left

## 2015-12-05 MED ORDER — OXYCODONE HCL 5 MG PO TABS
5.0000 mg | ORAL_TABLET | ORAL | Status: DC | PRN
  Administered 2015-12-05: 10 mg via ORAL
  Administered 2015-12-06: 5 mg via ORAL
  Administered 2015-12-06 – 2015-12-07 (×5): 10 mg via ORAL
  Filled 2015-12-05 (×6): qty 2
  Filled 2015-12-05: qty 1

## 2015-12-05 MED ORDER — LACTATED RINGERS IV SOLN
INTRAVENOUS | Status: DC | PRN
  Administered 2015-12-05 (×2): via INTRAVENOUS

## 2015-12-05 MED ORDER — ALUM & MAG HYDROXIDE-SIMETH 200-200-20 MG/5ML PO SUSP
30.0000 mL | ORAL | Status: DC | PRN
  Administered 2015-12-05 – 2015-12-06 (×2): 30 mL via ORAL
  Filled 2015-12-05 (×2): qty 30

## 2015-12-05 MED ORDER — ONDANSETRON HCL 4 MG PO TABS
4.0000 mg | ORAL_TABLET | Freq: Four times a day (QID) | ORAL | Status: DC | PRN
  Administered 2015-12-05: 4 mg via ORAL
  Filled 2015-12-05: qty 1

## 2015-12-05 MED ORDER — SUCCINYLCHOLINE CHLORIDE 200 MG/10ML IV SOSY
PREFILLED_SYRINGE | INTRAVENOUS | Status: AC
  Filled 2015-12-05: qty 10

## 2015-12-05 MED ORDER — PHENYLEPHRINE HCL 10 MG/ML IJ SOLN
INTRAMUSCULAR | Status: DC | PRN
  Administered 2015-12-05: 20 ug/min via INTRAVENOUS

## 2015-12-05 MED ORDER — MENTHOL 3 MG MT LOZG: 1.0000 | LOZENGE | OROMUCOSAL | Status: DC | PRN

## 2015-12-05 MED ORDER — OXYCODONE HCL 5 MG/5ML PO SOLN: 5.0000 mg | Freq: Once | ORAL | Status: DC | PRN

## 2015-12-05 MED ORDER — ACETAMINOPHEN 650 MG RE SUPP: 650.0000 mg | Freq: Four times a day (QID) | RECTAL | Status: DC | PRN

## 2015-12-05 MED ORDER — FAMOTIDINE 20 MG PO TABS
20.0000 mg | ORAL_TABLET | Freq: Every day | ORAL | Status: DC
  Administered 2015-12-05 – 2015-12-07 (×3): 20 mg via ORAL
  Filled 2015-12-05 (×3): qty 1

## 2015-12-05 MED ORDER — CEFAZOLIN SODIUM-DEXTROSE 2-4 GM/100ML-% IV SOLN
2.0000 g | INTRAVENOUS | Status: AC
  Administered 2015-12-05: 2 g via INTRAVENOUS
  Filled 2015-12-05: qty 100

## 2015-12-05 MED ORDER — PROPOFOL 10 MG/ML IV BOLUS
INTRAVENOUS | Status: AC
  Filled 2015-12-05: qty 20

## 2015-12-05 MED ORDER — ACETAMINOPHEN 325 MG PO TABS
650.0000 mg | ORAL_TABLET | Freq: Four times a day (QID) | ORAL | Status: DC | PRN
  Administered 2015-12-06: 650 mg via ORAL
  Filled 2015-12-05: qty 2

## 2015-12-05 MED ORDER — 0.9 % SODIUM CHLORIDE (POUR BTL) OPTIME
TOPICAL | Status: DC | PRN
  Administered 2015-12-05: 1000 mL

## 2015-12-05 MED ORDER — FENTANYL CITRATE (PF) 100 MCG/2ML IJ SOLN
INTRAMUSCULAR | Status: AC
  Filled 2015-12-05: qty 4

## 2015-12-05 MED ORDER — ONDANSETRON HCL 4 MG PO TABS: 4.0000 mg | ORAL_TABLET | Freq: Three times a day (TID) | ORAL | 0 refills | Status: DC | PRN

## 2015-12-05 MED ORDER — ONDANSETRON HCL 4 MG/2ML IJ SOLN: 4.0000 mg | Freq: Four times a day (QID) | INTRAMUSCULAR | Status: DC | PRN

## 2015-12-05 MED ORDER — LISINOPRIL 40 MG PO TABS
40.0000 mg | ORAL_TABLET | Freq: Every day | ORAL | Status: DC
  Administered 2015-12-05 – 2015-12-07 (×3): 40 mg via ORAL
  Filled 2015-12-05 (×3): qty 1

## 2015-12-05 MED ORDER — BUPIVACAINE-EPINEPHRINE (PF) 0.5% -1:200000 IJ SOLN
INTRAMUSCULAR | Status: DC | PRN
  Administered 2015-12-05: 30 mL via PERINEURAL

## 2015-12-05 MED ORDER — DIPHENHYDRAMINE HCL 12.5 MG/5ML PO ELIX: 12.5000 mg | ORAL_SOLUTION | ORAL | Status: DC | PRN

## 2015-12-05 MED ORDER — PROPOFOL 10 MG/ML IV BOLUS
INTRAVENOUS | Status: DC | PRN
  Administered 2015-12-05: 30 mg via INTRAVENOUS
  Administered 2015-12-05: 140 mg via INTRAVENOUS

## 2015-12-05 MED ORDER — POTASSIUM CHLORIDE IN NACL 20-0.45 MEQ/L-% IV SOLN
INTRAVENOUS | Status: DC
  Administered 2015-12-05 – 2015-12-06 (×2): via INTRAVENOUS
  Filled 2015-12-05 (×4): qty 1000

## 2015-12-05 MED ORDER — ONDANSETRON HCL 4 MG/2ML IJ SOLN
INTRAMUSCULAR | Status: AC
  Filled 2015-12-05: qty 2

## 2015-12-05 MED ORDER — BUPIVACAINE HCL (PF) 0.25 % IJ SOLN
INTRAMUSCULAR | Status: AC
  Filled 2015-12-05: qty 30

## 2015-12-05 MED ORDER — CEFAZOLIN IN D5W 1 GM/50ML IV SOLN
1.0000 g | Freq: Four times a day (QID) | INTRAVENOUS | Status: AC
  Administered 2015-12-05 – 2015-12-06 (×3): 1 g via INTRAVENOUS
  Filled 2015-12-05 (×3): qty 50

## 2015-12-05 MED ORDER — OXYCODONE-ACETAMINOPHEN 10-325 MG PO TABS: 1.0000 | ORAL_TABLET | Freq: Four times a day (QID) | ORAL | 0 refills | Status: DC | PRN

## 2015-12-05 MED ORDER — HYDROMORPHONE HCL 1 MG/ML IJ SOLN: 0.2500 mg | INTRAMUSCULAR | Status: DC | PRN

## 2015-12-05 MED ORDER — ROCURONIUM BROMIDE 100 MG/10ML IV SOLN
INTRAVENOUS | Status: DC | PRN
  Administered 2015-12-05: 40 mg via INTRAVENOUS

## 2015-12-05 MED ORDER — PANTOPRAZOLE SODIUM 40 MG PO TBEC
40.0000 mg | DELAYED_RELEASE_TABLET | Freq: Every day | ORAL | Status: DC
  Administered 2015-12-06 – 2015-12-07 (×2): 40 mg via ORAL
  Filled 2015-12-05 (×3): qty 1

## 2015-12-05 MED ORDER — ALPRAZOLAM 0.5 MG PO TABS: 0.5000 mg | ORAL_TABLET | Freq: Every day | ORAL | Status: DC | PRN

## 2015-12-05 MED ORDER — TIZANIDINE HCL 4 MG PO TABS: 4.0000 mg | ORAL_TABLET | Freq: Every day | ORAL | Status: DC | PRN

## 2015-12-05 MED ORDER — PHENOL 1.4 % MT LIQD: 1.0000 | OROMUCOSAL | Status: DC | PRN

## 2015-12-05 MED ORDER — METOCLOPRAMIDE HCL 5 MG PO TABS: 5.0000 mg | ORAL_TABLET | Freq: Three times a day (TID) | ORAL | Status: DC | PRN

## 2015-12-05 MED ORDER — PANTOPRAZOLE SODIUM 40 MG PO TBEC: 40.0000 mg | DELAYED_RELEASE_TABLET | Freq: Every day | ORAL | Status: DC

## 2015-12-05 MED ORDER — POLYETHYLENE GLYCOL 3350 17 G PO PACK: 17.0000 g | PACK | Freq: Every day | ORAL | Status: DC | PRN

## 2015-12-05 MED ORDER — DOCUSATE SODIUM 100 MG PO CAPS
100.0000 mg | ORAL_CAPSULE | Freq: Two times a day (BID) | ORAL | Status: DC
  Administered 2015-12-05 – 2015-12-07 (×5): 100 mg via ORAL
  Filled 2015-12-05 (×5): qty 1

## 2015-12-05 MED ORDER — MIDAZOLAM HCL 2 MG/2ML IJ SOLN
INTRAMUSCULAR | Status: AC
  Filled 2015-12-05: qty 2

## 2015-12-05 MED ORDER — BISACODYL 10 MG RE SUPP: 10.0000 mg | Freq: Every day | RECTAL | Status: DC | PRN

## 2015-12-05 MED ORDER — LIDOCAINE HCL (CARDIAC) 20 MG/ML IV SOLN
INTRAVENOUS | Status: DC | PRN
  Administered 2015-12-05: 60 mg via INTRAVENOUS

## 2015-12-05 MED ORDER — METOCLOPRAMIDE HCL 5 MG/ML IJ SOLN
5.0000 mg | Freq: Three times a day (TID) | INTRAMUSCULAR | Status: DC | PRN
  Administered 2015-12-05: 10 mg via INTRAVENOUS
  Filled 2015-12-05: qty 2

## 2015-12-05 MED ORDER — MAGNESIUM CITRATE PO SOLN: 1.0000 | Freq: Once | ORAL | Status: DC | PRN

## 2015-12-05 MED ORDER — CYCLOSPORINE 0.05 % OP EMUL
1.0000 [drp] | Freq: Two times a day (BID) | OPHTHALMIC | Status: DC
  Administered 2015-12-05 – 2015-12-07 (×5): 1 [drp] via OPHTHALMIC
  Filled 2015-12-05 (×5): qty 1

## 2015-12-05 MED ORDER — SENNA 8.6 MG PO TABS
1.0000 | ORAL_TABLET | Freq: Two times a day (BID) | ORAL | Status: DC
  Administered 2015-12-05 – 2015-12-07 (×5): 8.6 mg via ORAL
  Filled 2015-12-05 (×5): qty 1

## 2015-12-05 MED ORDER — HYDROMORPHONE HCL 1 MG/ML IJ SOLN: 0.5000 mg | INTRAMUSCULAR | Status: DC | PRN

## 2015-12-05 MED ORDER — OXYCODONE HCL 5 MG PO TABS: 5.0000 mg | ORAL_TABLET | Freq: Once | ORAL | Status: DC | PRN

## 2015-12-05 MED ORDER — ONDANSETRON HCL 4 MG/2ML IJ SOLN
INTRAMUSCULAR | Status: DC | PRN
  Administered 2015-12-05: 4 mg via INTRAVENOUS

## 2015-12-05 MED ORDER — EPHEDRINE 5 MG/ML INJ
INTRAVENOUS | Status: AC
  Filled 2015-12-05: qty 10

## 2015-12-05 MED ORDER — TOPIRAMATE 25 MG PO TABS: 50.0000 mg | ORAL_TABLET | Freq: Two times a day (BID) | ORAL | Status: DC | PRN

## 2015-12-05 MED ORDER — MIDAZOLAM HCL 5 MG/5ML IJ SOLN
INTRAMUSCULAR | Status: DC | PRN
  Administered 2015-12-05: 2 mg via INTRAVENOUS

## 2015-12-05 MED ORDER — LORATADINE 10 MG PO TABS
10.0000 mg | ORAL_TABLET | Freq: Every day | ORAL | Status: DC
  Administered 2015-12-05 – 2015-12-06 (×2): 10 mg via ORAL
  Filled 2015-12-05 (×2): qty 1

## 2015-12-05 MED ORDER — SENNA-DOCUSATE SODIUM 8.6-50 MG PO TABS: 2.0000 | ORAL_TABLET | Freq: Every day | ORAL | 1 refills | Status: DC

## 2015-12-05 MED ORDER — FENTANYL CITRATE (PF) 100 MCG/2ML IJ SOLN
INTRAMUSCULAR | Status: DC | PRN
  Administered 2015-12-05: 100 ug via INTRAVENOUS
  Administered 2015-12-05: 50 ug via INTRAVENOUS

## 2015-12-05 MED ORDER — HYDROMORPHONE HCL 2 MG/ML IJ SOLN
0.5000 mg | INTRAMUSCULAR | Status: DC | PRN
  Administered 2015-12-06 (×3): 0.5 mg via INTRAVENOUS
  Filled 2015-12-05 (×3): qty 1

## 2015-12-05 SURGICAL SUPPLY — 63 items
BIT DRILL 5/64X5 DISP (BIT) ×3 IMPLANT
BLADE SAW SGTL MED 73X18.5 STR (BLADE) ×3 IMPLANT
CAPT SHLDR TOTAL 2 ×3 IMPLANT
CEMENT BONE DEPUY (Cement) ×3 IMPLANT
CLOSURE STERI-STRIP 1/2X4 (GAUZE/BANDAGES/DRESSINGS) ×1
CLSR STERI-STRIP ANTIMIC 1/2X4 (GAUZE/BANDAGES/DRESSINGS) ×2 IMPLANT
COVER SURGICAL LIGHT HANDLE (MISCELLANEOUS) ×3 IMPLANT
DRAPE ORTHO SPLIT 77X108 STRL (DRAPES) ×4
DRAPE PROXIMA HALF (DRAPES) ×3 IMPLANT
DRAPE SURG ORHT 6 SPLT 77X108 (DRAPES) ×2 IMPLANT
DRAPE U-SHAPE 47X51 STRL (DRAPES) ×3 IMPLANT
DRSG MEPILEX BORDER 4X8 (GAUZE/BANDAGES/DRESSINGS) ×3 IMPLANT
DURAPREP 26ML APPLICATOR (WOUND CARE) ×6 IMPLANT
ELECT REM PT RETURN 9FT ADLT (ELECTROSURGICAL) ×3
ELECTRODE REM PT RTRN 9FT ADLT (ELECTROSURGICAL) ×1 IMPLANT
EVACUATOR 1/8 PVC DRAIN (DRAIN) IMPLANT
GLOVE BIOGEL PI IND STRL 8 (GLOVE) ×1 IMPLANT
GLOVE BIOGEL PI INDICATOR 8 (GLOVE) ×2
GLOVE BIOGEL PI ORTHO PRO SZ8 (GLOVE) ×2
GLOVE ORTHO TXT STRL SZ7.5 (GLOVE) ×3 IMPLANT
GLOVE PI ORTHO PRO STRL SZ8 (GLOVE) ×1 IMPLANT
GLOVE SURG ORTHO 8.0 STRL STRW (GLOVE) IMPLANT
GOWN STRL REUS W/ TWL LRG LVL3 (GOWN DISPOSABLE) ×1 IMPLANT
GOWN STRL REUS W/ TWL XL LVL3 (GOWN DISPOSABLE) ×1 IMPLANT
GOWN STRL REUS W/TWL 2XL LVL3 (GOWN DISPOSABLE) ×3 IMPLANT
GOWN STRL REUS W/TWL LRG LVL3 (GOWN DISPOSABLE) ×2
GOWN STRL REUS W/TWL XL LVL3 (GOWN DISPOSABLE) ×2
HANDPIECE INTERPULSE COAX TIP (DISPOSABLE) ×2
HOOD PEEL AWAY FACE SHEILD DIS (HOOD) ×6 IMPLANT
KIT BASIN OR (CUSTOM PROCEDURE TRAY) ×3 IMPLANT
KIT ROOM TURNOVER OR (KITS) ×3 IMPLANT
MANIFOLD NEPTUNE II (INSTRUMENTS) ×3 IMPLANT
NEEDLE 1/2 CIR CATGUT .05X1.09 (NEEDLE) ×3 IMPLANT
NEEDLE HYPO 25GX1X1/2 BEV (NEEDLE) IMPLANT
NS IRRIG 1000ML POUR BTL (IV SOLUTION) ×3 IMPLANT
PACK SHOULDER (CUSTOM PROCEDURE TRAY) ×3 IMPLANT
PAD ARMBOARD 7.5X6 YLW CONV (MISCELLANEOUS) ×6 IMPLANT
PIN HUMERAL STMN 3.2MMX9IN (INSTRUMENTS) ×3 IMPLANT
SET HNDPC FAN SPRY TIP SCT (DISPOSABLE) ×1 IMPLANT
SLING ARM IMMOBILIZER LRG (SOFTGOODS) ×3 IMPLANT
SLING ARM IMMOBILIZER MED (SOFTGOODS) IMPLANT
SMARTMIX MINI TOWER (MISCELLANEOUS) ×3
SPONGE LAP 18X18 X RAY DECT (DISPOSABLE) ×6 IMPLANT
SUCTION FRAZIER HANDLE 10FR (MISCELLANEOUS) ×2
SUCTION TUBE FRAZIER 10FR DISP (MISCELLANEOUS) ×1 IMPLANT
SUPPORT WRAP ARM LG (MISCELLANEOUS) ×3 IMPLANT
SUT FIBERWIRE #2 38 REV NDL BL (SUTURE) ×12
SUT MAXBRAID (SUTURE) ×6 IMPLANT
SUT MNCRL AB 4-0 PS2 18 (SUTURE) IMPLANT
SUT VIC AB 0 CT1 27 (SUTURE) ×2
SUT VIC AB 0 CT1 27XBRD ANBCTR (SUTURE) ×1 IMPLANT
SUT VIC AB 2-0 CT1 27 (SUTURE)
SUT VIC AB 2-0 CT1 TAPERPNT 27 (SUTURE) IMPLANT
SUT VIC AB 3-0 SH 8-18 (SUTURE) ×3 IMPLANT
SUTURE FIBERWR#2 38 REV NDL BL (SUTURE) ×4 IMPLANT
SYR CONTROL 10ML LL (SYRINGE) IMPLANT
TOWEL OR 17X24 6PK STRL BLUE (TOWEL DISPOSABLE) ×3 IMPLANT
TOWEL OR 17X26 10 PK STRL BLUE (TOWEL DISPOSABLE) ×3 IMPLANT
TOWER SMARTMIX MINI (MISCELLANEOUS) ×1 IMPLANT
TUBE CONNECTING 12'X1/4 (SUCTIONS) ×1
TUBE CONNECTING 12X1/4 (SUCTIONS) ×2 IMPLANT
WATER STERILE IRR 1000ML POUR (IV SOLUTION) ×3 IMPLANT
YANKAUER SUCT BULB TIP NO VENT (SUCTIONS) ×3 IMPLANT

## 2015-12-05 NOTE — Anesthesia Procedure Notes (Signed)
Procedure Name: Intubation Date/Time: 12/05/2015 7:48 AM Performed by: Carney Living Pre-anesthesia Checklist: Patient identified, Emergency Drugs available, Suction available, Patient being monitored and Timeout performed Patient Re-evaluated:Patient Re-evaluated prior to inductionOxygen Delivery Method: Circle system utilized Preoxygenation: Pre-oxygenation with 100% oxygen Intubation Type: IV induction Ventilation: Mask ventilation without difficulty Laryngoscope Size: Mac and 4 Grade View: Grade I Tube type: Oral Tube size: 7.0 mm Number of attempts: 1 Airway Equipment and Method: Stylet Placement Confirmation: ETT inserted through vocal cords under direct vision,  positive ETCO2 and breath sounds checked- equal and bilateral Secured at: 22 cm Tube secured with: Tape Dental Injury: Teeth and Oropharynx as per pre-operative assessment

## 2015-12-05 NOTE — Anesthesia Preprocedure Evaluation (Addendum)
Anesthesia Evaluation  Patient identified by MRN, date of birth, ID band Patient awake    Reviewed: Allergy & Precautions, NPO status , Patient's Chart, lab work & pertinent test results  Airway Mallampati: II  TM Distance: >3 FB Neck ROM: Full    Dental  (+) Teeth Intact, Dental Advisory Given   Pulmonary shortness of breath and with exertion,    breath sounds clear to auscultation       Cardiovascular hypertension, Pt. on medications  Rhythm:Regular     Neuro/Psych Seizures -, Well Controlled,  PSYCHIATRIC DISORDERS Anxiety Depression    GI/Hepatic GERD  Medicated and Poorly Controlled,  Endo/Other    Renal/GU      Musculoskeletal   Abdominal   Peds  Hematology  (+) anemia ,   Anesthesia Other Findings   Reproductive/Obstetrics                            Anesthesia Physical Anesthesia Plan  ASA: II  Anesthesia Plan: General   Post-op Pain Management: GA combined w/ Regional for post-op pain   Induction: Intravenous  Airway Management Planned: Oral ETT  Additional Equipment: None  Intra-op Plan:   Post-operative Plan: Extubation in OR  Informed Consent: I have reviewed the patients History and Physical, chart, labs and discussed the procedure including the risks, benefits and alternatives for the proposed anesthesia with the patient or authorized representative who has indicated his/her understanding and acceptance.   Dental advisory given  Plan Discussed with: CRNA, Anesthesiologist and Surgeon  Anesthesia Plan Comments:       Anesthesia Quick Evaluation

## 2015-12-05 NOTE — Discharge Instructions (Signed)

## 2015-12-05 NOTE — H&P (Signed)
PREOPERATIVE H&P  Chief Complaint: POST TRAUMATIC LEFT SHOULDER  HPI: [name] is a 45 y.o. female who presents for preoperative history and physical with a diagnosis of POST TRAUMATICLEFT SHOULDER. Symptoms are rated as moderate to severe, and have been worsening.  This is significantly impairing activities of daily living.  She has elected for surgical management.   She has failed injections, activity modification, anti-inflammatories, and assistive devices.  Preoperative X-rays demonstrate end stage degenerative changes with osteophyte formation, loss of joint space, subchondral sclerosis.   Past Medical History:  Diagnosis Date  . Abdominal pain    takes omeprazole  . Anemia   . Atypical chest pain   . Blood transfusion   . Chronic back pain greater than 3 months duration   . Chronic neck pain   . Depression   . Dyspnea   . Dyspnea on exertion   . Dysrhythmia    Dr Francetta Found no  . Headache(784.0)   . Hypertension    dr Huey Bienenstock  . Kidney stones   . Seizures ([place])    Past Surgical History:  Procedure Laterality Date  . ABDOMINAL HYSTERECTOMY    . ABDOMINOPLASTY    . BACK SURGERY    . BREAST SURGERY     breast implants x 2  . CERVICAL SPINE SURGERY     2002  . CESAREAN SECTION     x2  . CHOLECYSTECTOMY  02/28/2011   Procedure: LAPAROSCOPIC CHOLECYSTECTOMY;  Surgeon: Joyice Faster. Cornett, MD;  Location: [facility];  Service: General;  Laterality: N/A;  Laparoscopic cholecystectomy.  . colonoscopy    . HERNIA REPAIR  08/22/11   [facility]  . SHOULDER SURGERY     left humeral head replacement 2001  . VENTRAL HERNIA REPAIR  08/22/2011   Procedure: LAPAROSCOPIC VENTRAL HERNIA;  Surgeon: Joyice Faster. Cornett, MD;  Location: [facility];  Service: General;  Laterality: N/A;   Social History   Social History  . Marital status: Legally Separated    Spouse name: N/A  . Number of children: N/A  . Years of education: N/A   Social History Main Topics  . Smoking status:  Never Smoker  . Smokeless tobacco: Never Used  . Alcohol use No  . Drug use: No  . Sexual activity: Yes    Birth control/ protection: Surgical   Other Topics Concern  . None   Social History Narrative  . None   Family History  Problem Relation Age of Onset  . Cancer Mother     colon, skin  . Anesthesia problems Mother   . Heart disease Father   . Cancer Maternal Aunt     OVARIAN  . Breast cancer Maternal Aunt    Allergies  Allergen Reactions  . Diclofenac Anaphylaxis  . Latex Anaphylaxis  . Naproxen Anaphylaxis   Prior to Admission medications   Medication Sig Start Date End Date Taking? Authorizing Provider  ALPRAZolam Duanne Moron) 0.5 MG tablet Take 0.5 mg by mouth daily as needed for anxiety.   Yes Historical Provider, MD  cycloSPORINE (RESTASIS) 0.05 % ophthalmic emulsion Place 1 drop into both eyes 2 (two) times daily.   Yes Historical Provider, MD  lisinopril (PRINIVIL,ZESTRIL) 40 MG tablet Take 40 mg by mouth daily.   Yes Historical Provider, MD  loratadine (CLARITIN) 10 MG tablet Take 10 mg by mouth at bedtime.   Yes Historical Provider, MD  pantoprazole (PROTONIX) 40 MG tablet Take 40 mg by mouth daily.   Yes Historical Provider, MD  ranitidine (ZANTAC) 300 MG tablet Take 300 mg by mouth at bedtime.   Yes Historical Provider, MD  tiZANidine (ZANAFLEX) 4 MG tablet Take 4 mg by mouth daily as needed for muscle spasms.   Yes Historical Provider, MD  topiramate (TOPAMAX) 25 MG tablet Take 50 mg by mouth 2 (two) times daily as needed (headaches).   Yes Historical Provider, MD  dexlansoprazole (DEXILANT) 60 MG capsule Take 60 mg by mouth daily.    Historical Provider, MD     Positive ROS: All other systems have been reviewed and were otherwise negative with the exception of those mentioned in the HPI and as above.  Physical Exam: General: Alert, no acute distress Cardiovascular: No pedal edema Respiratory: No cyanosis, no use of accessory musculature GI: No  organomegaly, abdomen is soft and non-tender Skin: No lesions in the area of chief complaint Neurologic: Sensation intact distally Psychiatric: Patient is competent for consent with normal mood and affect Lymphatic: No axillary or cervical lymphadenopathy  MUSCULOSKELETAL: left shoulder ROM 0-60 degrees, no ER, painful arc throughout.  Assessment: POST TRAUMATIC OA LEFT SHOULDER   Plan: Plan for Procedure(s): TOTAL SHOULDER ARTHROPLASTY vs. RTSA  The risks benefits and alternatives were discussed with the patient including but not limited to the risks of nonoperative treatment, versus surgical intervention including infection, bleeding, nerve injury,  blood clots, cardiopulmonary complications, morbidity, mortality, among others, and they were willing to proceed.   Johnny Bridge, MD Cell [phone]   12/05/2015 7:22 AM

## 2015-12-05 NOTE — Progress Notes (Signed)
Orthopedic Tech Progress Note Patient Details:  [name] July 27, 1970 [id number]  Patient already has sling. Patient ID: [name], female   DOB: September 06, 1970, 45 y.o.   MRN: [id number]   [name] 12/05/2015, 2:52 PM

## 2015-12-05 NOTE — Anesthesia Procedure Notes (Addendum)
Anesthesia Regional Block:  Interscalene brachial plexus block  Pre-Anesthetic Checklist: ,, timeout performed, Correct Patient, Correct Site, Correct Laterality, Correct Procedure, Correct Position, site marked, Risks and benefits discussed,  Surgical consent,  Pre-op evaluation,  At surgeon's request and post-op pain management  Laterality: Upper and Left  Prep: chloraprep       Needles:  Injection technique: Single-shot  Needle Type: Echogenic Stimulator Needle          Additional Needles:  Procedures: ultrasound guided (picture in chart) Interscalene brachial plexus block Narrative:  Start time: 12/05/2015 7:12 AM End time: 12/05/2015 7:17 AM Injection made incrementally with aspirations every 5 mL.  Performed by: Personally  Anesthesiologist: [name] Hantz  Additional Notes: H+P and labs reviewed, risks and benefits discussed with patient, procedure tolerated well without complications

## 2015-12-05 NOTE — Transfer of Care (Signed)
Immediate Anesthesia Transfer of Care Note  Patient: [name]  Procedure(s) Performed: Procedure(s): TOTAL SHOULDER ARTHROPLASTY (Left)  Patient Location: PACU  Anesthesia Type:General  Level of Consciousness: awake, alert , oriented and patient cooperative  Airway & Oxygen Therapy: Patient Spontanous Breathing and Patient connected to nasal cannula oxygen  Post-op Assessment: Report given to RN and Post -op Vital signs reviewed and stable  Post vital signs: Reviewed and stable  Last Vitals:  Vitals:   12/05/15 0629 12/05/15 1040  BP: 112/71 (!) 101/53  Pulse: 66 84  Resp: 20 20  Temp: 36.7 C     Last Pain:  Vitals:   12/05/15 1040  TempSrc:   PainSc: (P) 0-No pain      Patients Stated Pain Goal: 5 ([id number])  Complications: No apparent anesthesia complications

## 2015-12-05 NOTE — Op Note (Signed)
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  ANESTHESIA:   General  PREOPERATIVE INDICATIONS:  [name] is a  45 y.o. female who broke her left shoulder many years ago in [place] and had an open reduction internal fixation followed by removal of hardware. She had progressive stiffness and loss of function with severe pain over the left shoulder inability to do activities of daily living. She failed conservative measures with injections activity modification and anti-inflammatories and pain medications and elected for surgical management. We discussed the potential need for reverse shoulder arthroplasty, versus hemiarthroplasty versus total shoulder replacement. She also had abutment of her greater tuberosity secondary to the varus malunion of the humeral head, and I counseled her that I could not guarantee that she would regain significant motion after surgery, however our primary goal being improvement in her pain control.    The risks benefits and alternatives were discussed with the patient preoperatively including but not limited to the risks of infection, bleeding, nerve injury, cardiopulmonary complications, the need for revision surgery, dislocation, loosening, incomplete relief of pain, among others, and the patient was willing to proceed.   OPERATIVE IMPLANTS: Biomet size 8 micro-press-fit humeral stem, size 42+18 Versa-dial humeral head, set in the E position with increased coverage inferiorly, with a small cemented glenoid polyethylene 3 peg implant with a  central regenerex noncemented post.   OPERATIVE FINDINGS: She had substantial deformity of the proximal humerus with an extension type malrotation, with significant retroversion of the humeral head. The head was flattened, with substantial osteophyte inferiorly and anteriorly and posteriorly. The biceps tendon was present, although atrophic. The subscapularis had significant contracture. The glenoid had relatively neutral anatomy. There was still cartilage inferiorly, but posteriorly it was significantly worn. There were substantial subacromial adhesions, although the rotator cuff was still attached to the tuberosity.    OPERATIVE PROCEDURE: The patient was brought to the operating room and placed in the supine position. General anesthesia was administered. IV antibiotics were given.  The upper extremity was prepped and draped in usual sterile fashion. The patient was in a beachchair position with all bony prominences padded.   Time out was performed and a deltopectoral approach was carried out. The biceps tendon was tenodesed to the pectoralis tendon. The subscapularis was released, tagging it with a #2 FiberWire, and I peeled the subscapularis directly off of the bone in order to optimize length for reinsertion.  The inferior osteophyte was removed, and release of the capsule off of the humeral side was completed. The head was dislocated, and I reamed sequentially. I had to open the humeral head with a drill, and then used the reamers sequentially. I accepted the fact that I would be somewhat varus, due to the malunited tuberosity relationship to the humeral shaft.   I placed the humeral cutting guide at 30 of retroversion, and then pinned this into place, and made my humeral neck cut. This was at the appropriate level.   I then placed deep retractors and exposed the glenoid. I excised the labrum circumferentially, taking care to protect the axillary nerve inferiorly.   I then placed a guidewire  into the center position, controlling appropriate version and inclination. I then reamed over the guidewire with the small reamer, and was satisfied with the preparation. I preserved the subchondral bone in order to maximize the strength and minimize the risk for subsequent subsidence. I took more off inferiorly then superiorly.  I then drilled the central hole for the regenerex peg, and then placed the guide, and then drilled the 3 peripheral peg holes. I had excellent bony circumferential contact. I actually had an endpoint in all 4 holes, contained within the vault.  I then cleaned the glenoid, irrigated it copiously, and then dried it and cemented the prosthesis into place. Excellent seating was achieved. I had full exposure. The cement cured, and then I turned my attention to the humeral side.   I sequentially broached, up to the selected size, with the broach set at 30 of retroversion. I then placed the real stem. I was able to actually ream up to a size 9, however the size 8 broach was the largest size that would fully seat, and in fact it wanted to not fully seat, but I did repeatedly prepare the path for the real prosthesis, and then selected the size 8. I was concerned that going with a 7 would be under sizing, and with the micro-stem, might not have adequate metaphyseal hold. I trialed with multiple heads, and the above-named component was selected. Increased posterior coverage improved the coverage. The soft tissue tension was appropriate.   I then impacted the real humeral head into place, reduced the head, and irrigated copiously. Excellent stability and range of motion was achieved. I repaired the subscapularis with 4 #2 FiberWire, as well as the rotator interval, and irrigated copiously once more. The subscapularis was repaired using FiberWire placed for the stem, brought through drill holes in the bone created using the awl. I had excellent tendon bone rehabilitation position of the  subscapularis. The inferior most stitch I placed somewhat medially in order to get full coverage, and confirmed integrity of the axillary nerve by completion of the case. The subcutaneous tissue was closed with Vicryl including the deltopectoral fascia.   The skin was closed with Steri-Strips and sterile gauze was applied. She had a preoperative nerve block. She tolerated the procedure well and there were no complications.

## 2015-12-06 ENCOUNTER — Encounter (HOSPITAL_COMMUNITY): Payer: Self-pay | Admitting: Orthopedic Surgery

## 2015-12-06 LAB — CBC
HEMATOCRIT: 37.9 % (ref 36.0–46.0)
Hemoglobin: 12.2 g/dL (ref 12.0–15.0)
MCH: 31.8 pg (ref 26.0–34.0)
MCHC: 32.2 g/dL (ref 30.0–36.0)
MCV: 98.7 fL (ref 78.0–100.0)
PLATELETS: 257 10*3/uL (ref 150–400)
RBC: 3.84 MIL/uL — ABNORMAL LOW (ref 3.87–5.11)
RDW: 13 % (ref 11.5–15.5)
WBC: 4.8 10*3/uL (ref 4.0–10.5)

## 2015-12-06 LAB — BASIC METABOLIC PANEL
Anion gap: 7 (ref 5–15)
BUN: 7 mg/dL (ref 6–20)
CALCIUM: 8.4 mg/dL — AB (ref 8.9–10.3)
CO2: 21 mmol/L — AB (ref 22–32)
CREATININE: 0.71 mg/dL (ref 0.44–1.00)
Chloride: 107 mmol/L (ref 101–111)
GLUCOSE: 120 mg/dL — AB (ref 65–99)
Potassium: 4 mmol/L (ref 3.5–5.1)
Sodium: 135 mmol/L (ref 135–145)

## 2015-12-06 MED ORDER — BUPIVACAINE HCL (PF) 0.25 % IJ SOLN
INTRAMUSCULAR | Status: AC
  Filled 2015-12-06: qty 30

## 2015-12-06 MED ORDER — LIDOCAINE HCL (PF) 1 % IJ SOLN
INTRAMUSCULAR | Status: AC
  Filled 2015-12-06: qty 30

## 2015-12-06 NOTE — Progress Notes (Signed)
Visited with pt as two older family members slept across room and her husband and young daughter were nearby. Pt is Catholic. 57 communion minister had come by her room today, which she appreciated. Gave pt other Catholic services information. Chaplain available for follow-up.

## 2015-12-06 NOTE — Progress Notes (Signed)
Patient ID: [name], female   DOB: 1970-05-15, 45 y.o.   MRN: [id number]     Subjective:  Patient reports pain as mild to moderate.  Patient in bed and in no acute distress.  Complains of pain and BP changes  Objective:   VITALS:   Vitals:   12/05/15 2150 12/06/15 0133 12/06/15 0200 12/06/15 0645  BP: (!) 89/49 (!) 87/48 (!) 104/52 112/63  Pulse: 98 86  94  Resp: 16 16  16   Temp: 99.5 F (37.5 C) 99.3 F (37.4 C)  98.7 F (37.1 C)  TempSrc: Oral Oral  Oral  SpO2: 97% 97%  95%  Weight:      Height:        ABD soft Sensation intact distally Dorsiflexion/Plantar flexion intact Incision: dressing C/D/I and no drainage Good hand and wrist motion  Lab Results  Component Value Date   WBC 6.1 11/24/2015   HGB 13.8 11/24/2015   HCT 42.1 11/24/2015   MCV 98.4 11/24/2015   PLT 293 11/24/2015   BMET    Component Value Date/Time   NA 139 11/24/2015 0846   K 3.6 11/24/2015 0846   CL 110 11/24/2015 0846   CO2 21 (L) 11/24/2015 0846   GLUCOSE 80 11/24/2015 0846   BUN 18 11/24/2015 0846   CREATININE 0.79 11/24/2015 0846   CREATININE 0.63 09/03/2011 0943   CALCIUM 9.3 11/24/2015 0846   GFRNONAA >60 11/24/2015 0846   GFRAA >60 11/24/2015 0846     Assessment/Plan: 1 Day Post-Op   Principal Problem:   Post-traumatic arthrosis of left shoulder Active Problems:   S/P shoulder replacement   Advance diet Up with therapy Plan for discharge tomorrow BP changed likely due to pain meds and pain will continue to monitor Sling at all times NWB left upper ext Dry dressing PRN   DOUGLAS PARRY, BRANDON 12/06/2015, 7:48 AM  Discussed and agree with above.    Marchia Bond, MD Cell [phone]

## 2015-12-06 NOTE — Evaluation (Addendum)
Occupational Therapy Evaluation Patient Details Name: [name] MRN: [id number] DOB: [birth year] Today's Date: 12/06/2015    History of Present Illness Pt is a 45 y.o. 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 Pt has a past medical history significant for anemia, chronic back  pain, chronic neck pain, dyspnea on exertion, dysrhythmia, hypertension, and seizures and a past surgical history significant for but not limited to L humeral head replacement and cervical spine surgery.   Clinical Impression   PTA, pt was independent with ADL and IADL and was driving. Pt currently requiring moderate assist for ADL and min assist for functional mobility. Pt limited this session to dizziness after ambulating into bathroom. Based on reports, returned pt to reclined position and upon BP assessment, pt's BP was 120/65. Pt also reporting intense pain this session, limiting ability to participate in elbow/wrist/hand exercises. Pt would benefit from continued OT services while admitted to improve independence with ADL and conservative shoulder protocol elbow/wrist/hand exercises. OT will continue to follow with focus on pt/family education concerning conservative shoulder protocol and dressing techniques. Recommend D/C home with 24 hour supervision/assistance from family.     Follow Up Recommendations  No OT follow up;Other (comment);Supervision/Assistance - 24 hour (Follow-up per MD)    Equipment Recommendations  None recommended by OT       Precautions / Restrictions Precautions Precautions: Shoulder Type of Shoulder Precautions: Conservative protocol - NWB LUE, no AROM/PROM L shoulder, elbow/wrist/hand AROM, sling at all times. Shoulder Interventions: Shoulder sling/immobilizer Precaution Booklet Issued: Yes (comment) Precaution Comments: Reviewed handout Required Braces or Orthoses: Sling (L  shoulder sling/immobilizer) Restrictions Weight Bearing Restrictions: Yes LUE Weight Bearing: Non weight bearing      Mobility Bed Mobility Overal bed mobility: Needs Assistance Bed Mobility: Supine to Sit;Sit to Supine;Rolling Rolling: Min assist   Supine to sit: Min assist Sit to supine: Min guard   General bed mobility comments: pt hesitant to move in bed due to dizziness and pain. Physical assist for rolling and moving LE's off of bed but able to go sit<>supine with min guard assit for safety.  Transfers Overall transfer level: Needs assistance Equipment used: 1 person hand held assist Transfers: Sit to/from Stand Sit to Stand: Min guard;Min assist         General transfer comment: Min assist for initial sit<>stand from bed but min assist for sit<>stand from toilet.    Balance Overall balance assessment: Needs assistance Sitting-balance support: No upper extremity supported;Feet supported Sitting balance-Leahy Scale: Fair     Standing balance support: Single extremity supported;During functional activity Standing balance-Leahy Scale: Fair Standing balance comment: Able to stand statically without assist but min assist for dynamic movement to maintain balance.                            ADL Overall ADL's : Needs assistance/impaired     Grooming: Sitting;Minimal assistance   Upper Body Bathing: Moderate assistance;Sitting   Lower Body Bathing: Moderate assistance;Sit to/from stand   Upper Body Dressing : Moderate assistance;Sitting   Lower Body Dressing: Moderate assistance;Sit to/from stand   Toilet Transfer: Minimal assistance;Ambulation (Hand held assist)   Toileting- Clothing Manipulation and Hygiene: Min guard;Sit to/from stand       Functional mobility during ADLs: Minimal assistance (Hand held assist) General ADL Comments: Pt and family education on dressing techniques, bathing techniques, and conservative shoulder protocol.  Vision Vision Assessment?: No apparent visual deficits   Perception     Praxis      Pertinent Vitals/Pain Pain Assessment: 0-10 Pain Score: 10-Worst pain ever Pain Descriptors / Indicators: Aching;Grimacing;Sore     Hand Dominance Right   Extremity/Trunk Assessment Upper Extremity Assessment Upper Extremity Assessment: LUE deficits/detail LUE Deficits / Details: Decreased strength and ROM as expected post-operatively and post-operative pain. LUE: Unable to fully assess due to immobilization;Unable to fully assess due to pain   Lower Extremity Assessment Lower Extremity Assessment: Overall WFL for tasks assessed       Communication Communication Communication: No difficulties (English is pt's second language but no difficulties noted)   Cognition Arousal/Alertness: Awake/alert Behavior During Therapy: WFL for tasks assessed/performed Overall Cognitive Status: Within Functional Limits for tasks assessed                                Home Living Family/patient expects to be discharged to:: Private residence Living Arrangements: Spouse/significant other;Children Available Help at Discharge: Family;Available PRN/intermittently Type of Home: Apartment Home Access: Stairs to enter Entrance Stairs-Number of Steps: Flight (Lives in second floor apartment) Entrance Stairs-Rails: Can reach both Home Layout: One level     Bathroom Shower/Tub: Tub/shower unit Shower/tub characteristics: Curtain       Home Equipment: None          Prior Functioning/Environment Level of Independence: Independent                 OT Problem List: Decreased strength;Decreased range of motion;Decreased activity tolerance;Impaired balance (sitting and/or standing);Decreased safety awareness;Decreased knowledge of use of DME or AE;Decreased knowledge of precautions;Pain;Impaired UE functional use   OT Treatment/Interventions: Self-care/ADL training;Therapeutic  exercise;DME and/or AE instruction;Therapeutic activities;Patient/family education;Balance training    OT Goals(Current goals can be found in the care plan section) Acute Rehab OT Goals Patient Stated Goal: to feel better OT Goal Formulation: With patient Time For Goal Achievement: 12/20/15 Potential to Achieve Goals: Good ADL Goals Pt Will Perform Grooming: with supervision;sitting Pt Will Perform Upper Body Bathing: with supervision;sitting Pt Will Perform Lower Body Bathing: with min guard assist;sit to/from stand Pt Will Perform Upper Body Dressing: with supervision;sitting Pt Will Perform Lower Body Dressing: with min guard assist;sit to/from stand Pt Will Transfer to Toilet: with supervision;regular height toilet;ambulating Pt Will Perform Toileting - Clothing Manipulation and hygiene: with supervision;sit to/from stand Pt Will Perform Tub/Shower Transfer: with min guard assist;ambulating;Tub transfer  OT Frequency: Min 2X/week    End of Session Equipment Utilized During Treatment: Gait belt;Other (comment) (L shoulder sling) Nurse Communication: Mobility status  Activity Tolerance: Treatment limited secondary to medical complications (Comment);Patient limited by pain (Dizziness ) Patient left: in bed;with call bell/phone within reach;with family/visitor present   Time: [id number] OT Time Calculation (min): 32 min Charges:  OT General Charges $OT Visit: 1 Procedure OT Evaluation $OT Eval Moderate Complexity: 1 Procedure OT Treatments $Self Care/Home Management : 8-22 mins  Norman Herrlich, OTR/L [phone] 12/06/2015, 11:17 AM

## 2015-12-06 NOTE — Anesthesia Postprocedure Evaluation (Signed)
Anesthesia Post Note  Patient: [name]  Procedure(s) Performed: Procedure(s) (LRB): TOTAL SHOULDER ARTHROPLASTY (Left)  Patient location during evaluation: PACU Anesthesia Type: General and Regional Level of consciousness: awake Pain management: pain level controlled Vital Signs Assessment: post-procedure vital signs reviewed and stable Respiratory status: spontaneous breathing Cardiovascular status: stable Postop Assessment: no signs of nausea or vomiting Anesthetic complications: no    Last Vitals:  Vitals:   12/06/15 0645 12/06/15 1322  BP: 112/63 107/66  Pulse: 94 87  Resp: 16 18  Temp: 37.1 C 37.1 C    Last Pain:  Vitals:   12/06/15 1816  TempSrc:   PainSc: 0-No pain                 Lorenzo Pereyra

## 2015-12-06 NOTE — Progress Notes (Signed)
PT Cancellation Note  Patient Details Name: AMIELA RENNER MRN: [id number] DOB: [birth year]   Cancelled Treatment:    Reason Eval/Treat Not Completed: Medical issues which prohibited therapy. Pt reporting dizziness with ambulation and requests PT eval be held until tomorrow. Educated pt on importance of getting OOB. She is ambulating to/from bathroom with nursing assist. Current plan is for d/c home tomorrow. PT to follow up in AM to complete eval. She has steps to enter her home, and she does have good family support.   Lorriane Shire 12/06/2015, 10:56 AM

## 2015-12-06 NOTE — Progress Notes (Signed)
Occupational Therapy Treatment Patient Details Name: [name] MRN: [id number] DOB: August 18, 1970 Today's Date: 12/06/2015    History of present illness Pt is a 45 y.o. 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 Pt has a past medical history significant for anemia, chronic back  pain, chronic neck pain, dyspnea on exertion, dysrhythmia, hypertension, and seizures and a past surgical history significant for but not limited to L humeral head replacement and cervical spine surgery.   OT comments  Pt remains limited by dizziness at this time, but was able to perform toileting ADL with min assist for functional mobility and min guard for sit<>stand. Instructed pt in elbow/wrist/hand exercises and she was able to complete 10 repetitions of each while seated. Continued education concerning dressing and bathing techniques. Pt would continue to benefit from further OT services while admitted to improve independence with ADL. OT will continue to follow acutely.   Follow Up Recommendations  No OT follow up;Other (comment);Supervision/Assistance - 24 hour (Follow-up per MD)    Equipment Recommendations  None recommended by OT       Precautions / Restrictions Precautions Precautions: Shoulder Type of Shoulder Precautions: Conservative protocol - NWB LUE, no AROM/PROM L shoulder, elbow/wrist/hand AROM, sling at all times. Shoulder Interventions: Shoulder sling/immobilizer Precaution Booklet Issued: Yes (comment) Precaution Comments: Reviewed handout Required Braces or Orthoses: Sling Restrictions Weight Bearing Restrictions: Yes LUE Weight Bearing: Non weight bearing       Mobility Bed Mobility Overal bed mobility: Needs Assistance Bed Mobility: Supine to Sit;Sit to Supine;Rolling Rolling: Min assist   Supine to sit: Min assist Sit to supine: Supervision      Transfers Overall  transfer level: Needs assistance Equipment used: 1 person hand held assist Transfers: Sit to/from Stand Sit to Stand: Min guard              Balance Overall balance assessment: Needs assistance Sitting-balance support: No upper extremity supported;Feet supported Sitting balance-Leahy Scale: Fair     Standing balance support: Single extremity supported;During functional activity Standing balance-Leahy Scale: Fair                     ADL Overall ADL's : Needs assistance/impaired                 Upper Body Dressing : Maximal assistance;Sitting       Toilet Transfer: Min guard;Ambulation   Toileting- Clothing Manipulation and Hygiene: Min guard;Sit to/from stand       Functional mobility during ADLs: Minimal assistance General ADL Comments: Continued education concerning dressing and bathing techniques as well as elbow/wrist/hand exercises.                Cognition   Behavior During Therapy: WFL for tasks assessed/performed Overall Cognitive Status: Within Functional Limits for tasks assessed                         Exercises Shoulder Exercises Elbow Flexion: AROM;10 reps;Seated Wrist Flexion: AROM;10 reps;Seated Digit Composite Flexion: AROM;10 reps;Seated Donning/doffing shirt without moving shoulder: Maximal assistance Method for sponge bathing under operated UE: Maximal assistance Donning/doffing sling/immobilizer: Maximal assistance Correct positioning of sling/immobilizer: Moderate assistance ROM for elbow, wrist and digits of operated UE: Supervision/safety Sling wearing schedule (on at all times/off for ADL's): Supervision/safety Proper positioning of operated UE when showering: Supervision/safety Positioning of UE while sleeping: Supervision/safety   Shoulder Instructions Shoulder Instructions Donning/doffing  shirt without moving shoulder: Maximal assistance Method for sponge bathing under operated UE: Maximal  assistance Donning/doffing sling/immobilizer: Maximal assistance Correct positioning of sling/immobilizer: Moderate assistance ROM for elbow, wrist and digits of operated UE: Supervision/safety Sling wearing schedule (on at all times/off for ADL's): Supervision/safety Proper positioning of operated UE when showering: Supervision/safety Positioning of UE while sleeping: Supervision/safety          Pertinent Vitals/ Pain       Pain Assessment: Faces Faces Pain Scale: Hurts even more Pain Descriptors / Indicators: Aching;Grimacing;Sore Pain Intervention(s): Limited activity within patient's tolerance         Frequency  Min 3X/week        Progress Toward Goals  OT Goals(current goals can now be found in the care plan section)  Progress towards OT goals: Progressing toward goals  Acute Rehab OT Goals Patient Stated Goal: to feel better OT Goal Formulation: With patient Time For Goal Achievement: 12/20/15 Potential to Achieve Goals: Good ADL Goals Pt Will Perform Grooming: with supervision;sitting Pt Will Perform Upper Body Bathing: with supervision;sitting Pt Will Perform Lower Body Bathing: with min guard assist;sit to/from stand Pt Will Perform Upper Body Dressing: with supervision;sitting Pt Will Perform Lower Body Dressing: with min guard assist;sit to/from stand Pt Will Transfer to Toilet: with supervision;regular height toilet;ambulating Pt Will Perform Toileting - Clothing Manipulation and hygiene: with supervision;sit to/from stand Pt Will Perform Tub/Shower Transfer: with min guard assist;ambulating;Tub transfer  Plan Discharge plan remains appropriate       End of Session Equipment Utilized During Treatment: Gait belt;Other (comment) (L shoulder sling)   Activity Tolerance Treatment limited secondary to medical complications (Comment);Patient limited by pain (dizziness)   Patient Left in bed;with call bell/phone within reach;with family/visitor present    Nurse Communication Mobility status;Other (comment) (Dizziness)        Time: [id number] OT Time Calculation (min): 16 min  Charges: OT General Charges $OT Visit: 1 Procedure OT Treatments $Self Care/Home Management : 8-22 mins  Norman Herrlich, OTR/L [phone] 12/06/2015, 1:02 PM

## 2015-12-07 NOTE — Discharge Summary (Signed)
Physician Discharge Summary  Patient ID: KIMEKO HOFMANN MRN: [id number] DOB/AGE: April 29, 1970 45 y.o.  Admit date: 12/05/2015 Discharge date: 12/07/2015  Admission Diagnoses:  Post-traumatic arthrosis of left shoulder  Discharge Diagnoses:  Principal Problem:   Post-traumatic arthrosis of left shoulder Active Problems:   S/P shoulder replacement   Past Medical History:  Diagnosis Date  . Abdominal pain    takes omeprazole  . Anemia   . Atypical chest pain   . Blood transfusion   . Chronic back pain greater than 3 months duration   . Chronic neck pain   . Depression   . Dyspnea   . Dyspnea on exertion   . Dysrhythmia    Dr Francetta Found no  . Headache(784.0)   . Hypertension    dr Huey Bienenstock  . Kidney stones   . 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 Seizures ([place])     Surgeries: Procedure(s): TOTAL SHOULDER ARTHROPLASTY on 12/05/2015   Consultants (if any):   Discharged Condition: Improved  Hospital Course: MICHAELLA Liu is an 45 y.o. female who was admitted 12/05/2015 with a diagnosis of Post-traumatic arthrosis of left shoulder and went to the operating room on 12/05/2015 and underwent the above named procedures.    She was given perioperative antibiotics:  Anti-infectives    Start     Dose/Rate Route Frequency Ordered Stop   12/05/15 1230  ceFAZolin (ANCEF) IVPB 1 g/50 mL premix     1 g 100 mL/hr over 30 Minutes Intravenous Every 6 hours 12/05/15 1210 12/06/15 0053   12/05/15 0525  ceFAZolin (ANCEF) IVPB 2g/100 mL premix     2 g 200 mL/hr over 30 Minutes Intravenous On call to O.R. 12/05/15 ID:[id number] 12/05/15 0748    .  She was given sequential compression devices, early ambulation,  for DVT prophylaxis.  She reported some mild numbness in the ulnar side of her hand, but all motor was intact for radial, median, and ulnar and axillary nerves.    She benefited maximally from the hospital stay and there were no complications.     Recent vital signs:  Vitals:   12/06/15 2037 12/07/15 0526  BP: 114/69 111/67  Pulse: 81 88  Resp: 18 18  Temp: 99.6 F (37.6 C) 99.5 F (37.5 C)    Recent laboratory studies:  Lab Results  Component Value Date   HGB 12.2 12/06/2015   HGB 13.8 11/24/2015   HGB 13.3 07/25/2015   Lab Results  Component Value Date   WBC 4.8 12/06/2015   PLT 257 12/06/2015   No results found for: INR Lab Results  Component Value Date   NA 135 12/06/2015   K 4.0 12/06/2015   CL 107 12/06/2015   CO2 21 (L) 12/06/2015   BUN 7 12/06/2015   CREATININE 0.71 12/06/2015   GLUCOSE 120 (H) 12/06/2015    Discharge Medications:     Medication List    TAKE these medications   ALPRAZolam 0.5 MG tablet Commonly known as:  XANAX Take 0.5 mg by mouth daily as needed for anxiety.   cycloSPORINE 0.05 % ophthalmic emulsion Commonly known as:  RESTASIS Place 1 drop into both eyes 2 (two) times daily.   dexlansoprazole 60 MG capsule Commonly known as:  DEXILANT Take 60 mg by mouth daily.   lisinopril 40 MG tablet Commonly known as:  PRINIVIL,ZESTRIL Take 40 mg by mouth daily.   loratadine 10 MG tablet Commonly known as:  CLARITIN Take 10 mg by mouth at bedtime.  ondansetron 4 MG tablet Commonly known as:  ZOFRAN Take 1 tablet (4 mg total) by mouth every 8 (eight) hours as needed for nausea or vomiting.   oxyCODONE-acetaminophen 10-325 MG tablet Commonly known as:  PERCOCET Take 1-2 tablets by mouth every 6 (six) hours as needed for pain. MAXIMUM TOTAL ACETAMINOPHEN DOSE IS 4000 MG PER DAY   pantoprazole 40 MG tablet Commonly known as:  PROTONIX Take 40 mg by mouth daily.   ranitidine 300 MG tablet Commonly known as:  ZANTAC Take 300 mg by mouth at bedtime.   sennosides-docusate sodium 8.6-50 MG tablet Commonly known as:  SENOKOT-S Take 2 tablets by mouth daily.   tiZANidine 4 MG tablet Commonly known as:  ZANAFLEX Take 4 mg by mouth daily as needed for muscle spasms.    topiramate 25 MG tablet Commonly known as:  TOPAMAX Take 50 mg by mouth 2 (two) times daily as needed (headaches).       Diagnostic Studies: Dg Shoulder Left Port  Result Date: 12/05/2015 CLINICAL DATA:  Status post left shoulder replacement today. EXAM: LEFT SHOULDER - 1 VIEW COMPARISON:  MRI left shoulder 01/15/2008. Plain films left shoulder 06/21/2009. FINDINGS: The patient has undergone left shoulder arthroplasty. The device appears located. No fracture is identified. No focal bony lesion. Cervical fusion hardware noted. IMPRESSION: Status post left shoulder arthroplasty. No evidence of complication. Electronically Signed   By: Inge Rise M.D.   On: 12/05/2015 12:04    Disposition: 01-Home or Self Care    Follow-up Information    Alexsandro Salek P, MD. Schedule an appointment as soon as possible for a visit in 2 weeks.   Specialty:  Orthopedic Surgery Contact information: [address] [place] [address] [phone]            Signed: Johnny Bridge 12/07/2015, 7:22 AM

## 2015-12-07 NOTE — Progress Notes (Signed)
Occupational Therapy Treatment/Discharge Patient Details Name: [name] MRN: [id number] DOB: 1970/02/05 Today's Date: 12/07/2015    History of present illness Pt is a 45 y.o. 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 Pt has a past medical history significant for anemia, chronic back  pain, chronic neck pain, dyspnea on exertion, dysrhythmia, hypertension, and seizures and a past surgical history significant for but not limited to L humeral head replacement and cervical spine surgery.   OT comments  Pt progressing toward OT goals this session. Completed education concerning bathing/dressing techniques, elbow/wrist/hand exercises, sling wear schedule, and conservative shoulder protocol. Pt currently requiring supervision for functional mobility and mod assist for UB ADL and pt's husband is independent in assisting while adhering to conservative protocol. Pt and husband report no further questions or concerns. Pt discharging home this date and continue to recommend 24 hour supervision/assistance from family. OT will sign off.   Follow Up Recommendations  No OT follow up;Other (comment);Supervision/Assistance - 24 hour (Follow-up per MD)    Equipment Recommendations  None recommended by OT       Precautions / Restrictions Precautions Precautions: Shoulder Type of Shoulder Precautions: Conservative protocol - NWB LUE, no AROM/PROM L shoulder, elbow/wrist/hand AROM, sling at all times. Shoulder Interventions: Shoulder sling/immobilizer Precaution Booklet Issued: Yes (comment) Precaution Comments: Reviewed handout Required Braces or Orthoses: Sling Restrictions Weight Bearing Restrictions: Yes LUE Weight Bearing: Non weight bearing       Mobility Bed Mobility               General bed mobility comments: Received in recliner  Transfers Overall transfer level: Needs  assistance   Transfers: Sit to/from Stand Sit to Stand: Min guard              Balance Overall balance assessment: Needs assistance Sitting-balance support: No upper extremity supported;Feet supported Sitting balance-Leahy Scale: Fair     Standing balance support: No upper extremity supported;During functional activity Standing balance-Leahy Scale: Fair Standing balance comment: Able to stand statically and actively with min guard assist for safety but no physical assist or UE support needed.                   ADL Overall ADL's : Needs assistance/impaired         Upper Body Bathing: With caregiver independent assisting;Moderate assistance;Sitting   Lower Body Bathing: Moderate assistance;With caregiver independent assisting;Sit to/from stand   Upper Body Dressing : Moderate assistance;With caregiver independent assisting;Sitting   Lower Body Dressing: Moderate assistance;With caregiver independent assisting;Sit to/from stand           Tub/ Shower Transfer: Ambulation;Min guard   Functional mobility during ADLs: Min guard General ADL Comments: Continued education concerning dressing and bathing techniques as well as elbow/wrist/hand exercises.                Cognition   Behavior During Therapy: WFL for tasks assessed/performed Overall Cognitive Status: Within Functional Limits for tasks assessed                         Exercises Donning/doffing shirt without moving shoulder: Moderate assistance;Caregiver independent with task Method for sponge bathing under operated UE: Moderate assistance;Caregiver independent with task Donning/doffing sling/immobilizer: Moderate assistance;Caregiver independent with task Correct positioning of sling/immobilizer: Moderate assistance;Caregiver independent with task ROM for elbow, wrist and digits of operated UE: Supervision/safety Sling wearing schedule (on at all  times/off for ADL's):  Supervision/safety Proper positioning of operated UE when showering: Supervision/safety Positioning of UE while sleeping: Supervision/safety   Shoulder Instructions Shoulder Instructions Donning/doffing shirt without moving shoulder: Moderate assistance;Caregiver independent with task Method for sponge bathing under operated UE: Moderate assistance;Caregiver independent with task Donning/doffing sling/immobilizer: Moderate assistance;Caregiver independent with task Correct positioning of sling/immobilizer: Moderate assistance;Caregiver independent with task ROM for elbow, wrist and digits of operated UE: Supervision/safety Sling wearing schedule (on at all times/off for ADL's): Supervision/safety Proper positioning of operated UE when showering: Supervision/safety Positioning of UE while sleeping: Supervision/safety          Pertinent Vitals/ Pain       Pain Assessment: Faces Faces Pain Scale: Hurts even more Pain Location: L shoulder Pain Descriptors / Indicators: Aching;Operative site guarding Pain Intervention(s): Limited activity within patient's tolerance;Monitored during session;Repositioned         Frequency  Min 3X/week        Progress Toward Goals  OT Goals(current goals can now be found in the care plan section)  Progress towards OT goals: Progressing toward goals  Acute Rehab OT Goals Patient Stated Goal: to feel better OT Goal Formulation: With patient Time For Goal Achievement: 12/20/15 Potential to Achieve Goals: Good ADL Goals Pt Will Perform Grooming: with supervision;sitting Pt Will Perform Upper Body Bathing: with supervision;sitting Pt Will Perform Lower Body Bathing: with min guard assist;sit to/from stand Pt Will Perform Upper Body Dressing: with supervision;sitting Pt Will Perform Lower Body Dressing: with min guard assist;sit to/from stand Pt Will Transfer to Toilet: with supervision;regular height toilet;ambulating Pt Will Perform Toileting  - Clothing Manipulation and hygiene: with supervision;sit to/from stand Pt Will Perform Tub/Shower Transfer: with min guard assist;ambulating;Tub transfer  Plan Discharge plan remains appropriate       End of Session Equipment Utilized During Treatment: Gait belt;Other (comment) (L shoulder sling/immobilizer)   Activity Tolerance Patient tolerated treatment well   Patient Left in chair;with call bell/phone within reach;with family/visitor present           Time: [id number] OT Time Calculation (min): 11 min  Charges: OT General Charges $OT Visit: 1 Procedure OT Treatments $Self Care/Home Management : 8-22 mins  Norman Herrlich, OTR/L [phone] 12/07/2015, 11:03 AM

## 2015-12-07 NOTE — Progress Notes (Signed)
PT Cancellation Note  Patient Details Name: [name] MRN: [id number] DOB: 09/05/1970   Cancelled Treatment:    Reason Eval/Treat Not Completed: Patient declined as she was awaiting husband for discharge. She states she has walked the halls with her sister with no dizziness which nursing confirmed.  Pt does have flight of stairs and discussed use of rail on the R and having family member with her and she verbalized understanding.  Will sign off as pt discharging.   Alessandria Henken LUBECK 12/07/2015, 10:13 AM

## 2016-01-03 NOTE — Addendum Note (Signed)
Addendum  created 01/03/16 1737 by Oleta Mouse, MD   Anesthesia Intra Blocks edited, Sign clinical note

## 2016-03-25 ENCOUNTER — Encounter: Payer: Self-pay | Admitting: Physical Therapy

## 2016-03-25 ENCOUNTER — Ambulatory Visit: Payer: BLUE CROSS/BLUE SHIELD | Attending: Family Medicine | Admitting: Physical Therapy

## 2016-03-25 DIAGNOSIS — M25612 Stiffness of left shoulder, not elsewhere classified: Secondary | ICD-10-CM | POA: Diagnosis present

## 2016-03-25 DIAGNOSIS — M25512 Pain in left shoulder: Secondary | ICD-10-CM | POA: Diagnosis not present

## 2016-03-25 NOTE — Therapy (Signed)
[place], [place], [address] Phone: [phone]   Fax:  [phone]  Physical Therapy Evaluation  Patient Details  Name: [name] MRN: [id number] Date of Birth: [birth year] Referring Provider: Marchia Bond, MD  Encounter Date: 03/25/2016      PT End of Session - 03/25/16 1504    Visit Number 1   Number of Visits 17   Date for PT Re-Evaluation 05/24/16   Authorization Type BC[employer]- 30 visit limit   PT Start Time 1500   PT Stop Time 1549   PT Time Calculation (min) 49 min   Activity Tolerance Patient tolerated treatment well   Behavior During Therapy [facility] for tasks assessed/performed      Past Medical History:  Diagnosis Date  . Abdominal pain    takes omeprazole  . Anemia   . Atypical chest pain   . Blood transfusion   . Chronic back pain greater than 3 months duration   . Chronic neck pain   . Depression   . Dyspnea   . Dyspnea on exertion   . Dysrhythmia    Dr Francetta Found no  . Headache(784.0)   . Hypertension    dr Huey Bienenstock  . Kidney stones   . 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 Seizures ([place])     Past Surgical History:  Procedure Laterality Date  . ABDOMINAL HYSTERECTOMY    . ABDOMINOPLASTY    . BACK SURGERY    . BREAST SURGERY     breast implants x 2  . CERVICAL SPINE SURGERY     2002  . CESAREAN SECTION     x2  . CHOLECYSTECTOMY  02/28/2011   Procedure: LAPAROSCOPIC CHOLECYSTECTOMY;  Surgeon: Joyice Faster. Cornett, MD;  Location: [place];  Service: General;  Laterality: N/A;  Laparoscopic cholecystectomy.  . colonoscopy    . HERNIA REPAIR  08/22/11   [facility]  . SHOULDER SURGERY     left humeral head replacement 2001  . TOTAL SHOULDER ARTHROPLASTY Left 12/05/2015   Procedure: TOTAL SHOULDER ARTHROPLASTY;  Surgeon: Marchia Bond, MD;  Location: [place];  Service: Orthopedics;  Laterality: Left;  Marland Kitchen VENTRAL HERNIA REPAIR  08/22/2011   Procedure:  LAPAROSCOPIC VENTRAL HERNIA;  Surgeon: Joyice Faster. Cornett, MD;  Location: [place];  Service: General;  Laterality: N/A;    There were no vitals filed for this visit.       Subjective Assessment - 03/25/16 1507    Subjective Notices most pain on anterior chest on L. Currently works at [employer] for [employer]. Has son 39 yr old, daughter 91 yr old.    Limitations Lifting;House hold activities   Patient Stated Goals reaching, lifting, driving   Currently in Pain? Yes   Pain Score 7    Pain Location Shoulder   Pain Orientation Left;Anterior;Posterior   Pain Descriptors / Indicators Throbbing;Tightness;Sharp;Burning   Pain Radiating Towards L elbow & hand   Aggravating Factors  driving- L hand N/T, reaching, lifting   Pain Relieving Factors ice            [facility] PT Assessment - 03/25/16 0001      Assessment   Medical Diagnosis L TSA   Referring Provider Marchia Bond, MD   Onset Date/Surgical Date 12/05/15   Hand Dominance Right   Next MD Visit 4/30   Prior Therapy not this year     Precautions   Precautions Other (comment)   Precaution Comments fused C5-T4  Restrictions   Weight Bearing Restrictions No     Balance Screen   Has the patient fallen in the past 6 months No     [place] residence   Living Arrangements Spouse/significant other;Children     Prior Function   Level of Independence Independent     Cognition   Overall Cognitive Status Within Functional Limits for tasks assessed     Observation/Other Assessments   Focus on Therapeutic Outcomes (FOTO)  42% ability     Sensation   Additional Comments N/T when driving      Posture/Postural Control   Posture Comments forward rounded shoulders     ROM / Strength   AROM / PROM / Strength AROM;PROM;Strength     AROM   Overall AROM Comments R WFL, all L movements painful   AROM Assessment Site Shoulder   Right/Left Shoulder Left   Left Shoulder Extension 40 Degrees    Left Shoulder Flexion 60 Degrees   Left Shoulder ABduction 36 Degrees     PROM   Overall PROM Comments R WFL   PROM Assessment Site Shoulder   Right/Left Shoulder Left   Left Shoulder Flexion 64 Degrees   Left Shoulder ABduction 52 Degrees   Left Shoulder External Rotation 10 Degrees  at 0 abd     Strength   Overall Strength Comments R WFL; L 3-/5 unable to move through avail ROM against gravity   Strength Assessment Site Shoulder   Right/Left Shoulder Left     Palpation   Palpation comment L pectoralis, periscapular and upper trap musculature TTP; concordant pain at trigger points                   [facility] Adult PT Treatment/Exercise - 03/25/16 0001      Exercises   Exercises Shoulder     Shoulder Exercises: Seated   Retraction Limitations scapular retraction for postural alignment   External Rotation 10 reps   Theraband Level (Shoulder External Rotation) Level 1 (Yellow)     Shoulder Exercises: Standing   Flexion Limitations in mirror for coordinated movement     Shoulder Exercises: Stretch   Table Stretch -Flexion Limitations towel slide into flexion on table     Modalities   Modalities Moist Heat     Moist Heat Therapy   Number Minutes Moist Heat 10 Minutes   Moist Heat Location Shoulder  L     Manual Therapy   Manual Therapy Myofascial release   Manual therapy comments edu in use of theracane   Myofascial Release trigger point release L pectoralis                PT Education - 03/25/16 2021    Education provided Yes   Education Details anatomy of condition, POC, HEP, exercise form/rationale   Person(s) Educated Patient   Methods Explanation;Demonstration;Tactile cues;Verbal cues;Handout   Comprehension Verbalized understanding;Returned demonstration;Verbal cues required;Tactile cues required;Need further instruction          PT Short Term Goals - 03/25/16 2028      PT SHORT TERM GOAL #1   Title Pt will demo at least 10 deg  increase in GHJ AROM flexion and abduction without shoulder hike by 3/23   Baseline see flowsheet   Time 3   Period Weeks   Status New     PT SHORT TERM GOAL #2   Title Pt will be able to drive without limitation by N/T in arm/hand   Baseline  significant N/T with driving   Time 3   Period Weeks   Status New           PT Long Term Goals - 03/25/16 2030      PT LONG TERM GOAL #1   Title Pt will demo ability to lift 3# weight to highest cabinet without increased pain in shoulder in order to reach for objects at home by 4/27   Baseline unable at eval   Time 8   Period Weeks   Status New     PT LONG TERM GOAL #2   Title Pt will be able to carry her purse in her L hand without pain to improve functional use of arm   Baseline unable at eval   Time 8   Period Weeks   Status New     PT LONG TERM GOAL #3   Title FOTO to 52% ability to indicate significant improvement in functional ability   Baseline 42% ability at eval   Time 8   Period Weeks   Status New     PT LONG TERM GOAL #4   Title Flexion and abduction to 120 deg AROM to allow appropraite range for functional reach   Baseline see flowsheet   Time 8   Period Weeks   Status New     PT LONG TERM GOAL #5   Title All GHJ MMT to at least 4+/5 to indicate proper support to shoulder by surrounding musculature   Baseline see flowsheet   Time 8   Period Weeks   Status New               Plan - 03/25/16 2023    Clinical Impression Statement Pt presents to PT with complaints of shoulder pain and limited functional use, she is 16 weeks post op L TSA. Multiple trigger points noted that recreated concordant pain, released multiple in pectoralis today. Limited ability to mobilize through thoracic spine due ot multilevel fusion. Pt is severely limited in functional ROM and will benefit from skilled PT in order to improve strength, ROM and functional use of upper extremity. Pt reported feeling much better after treatment  today.    Rehab Potential Good   PT Frequency 2x / week   PT Duration 8 weeks   PT Treatment/Interventions ADLs/Self Care Home Management;Cryotherapy;Electrical Stimulation;Functional mobility training;Ultrasound;Moist Heat;Therapeutic activities;Therapeutic exercise;Neuromuscular re-education;Patient/family education;Passive range of motion;Scar mobilization;Manual techniques;Taping   PT Next Visit Plan GHJ stretching, periscapular control   PT Home Exercise Plan scapular retraction, resisted ER yellow tband, flexion table stretch, AROM in mirror   Consulted and Agree with Plan of Care Patient      Patient will benefit from skilled therapeutic intervention in order to improve the following deficits and impairments:  Decreased range of motion, Impaired UE functional use, Increased muscle spasms, Decreased activity tolerance, Pain, Improper body mechanics, Impaired flexibility, Decreased strength, Postural dysfunction, Impaired sensation  Visit Diagnosis: Acute pain of left shoulder - Plan: PT plan of care cert/re-cert  Stiffness of left shoulder, not elsewhere classified - Plan: PT plan of care cert/re-cert     Problem List Patient Active Problem List   Diagnosis Date Noted  . 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 S/P shoulder replacement 12/05/2015  . Essential hypertension 05/30/2015  . Atypical chest pain 05/30/2015  . Dyspnea on exertion 05/30/2015  . Intractable nausea and vomiting 05/27/2013  . Hematuria 05/27/2013  . Left nephrolithiasis 05/27/2013  . Bacterial vaginosis 05/27/2013  . Left flank  pain 05/27/2013  . Emesis, persistent 05/26/2013  . History of nephrolithiasis 04/22/2013  . SUI (stress urinary incontinence, female) 04/12/2013  . OAB (overactive bladder) 04/12/2013  . PTSD (post-traumatic stress disorder) 06/03/2011  . Anxiety disorder 05/31/2011  . 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  Biliary dyskinesia 02/11/2011   Ariahna Smiddy C. Lashayla Armes PT, DPT 03/25/16 8:36 PM   [facility] [address] [place], [place], [address] Phone: [phone]   Fax:  [phone]  Name: [name] MRN: [id number] Date of Birth: 07-21-1970

## 2016-04-03 ENCOUNTER — Ambulatory Visit: Payer: BLUE CROSS/BLUE SHIELD | Admitting: Physical Therapy

## 2016-04-05 ENCOUNTER — Encounter: Payer: Self-pay | Admitting: Physical Therapy

## 2016-04-05 ENCOUNTER — Ambulatory Visit: Payer: BLUE CROSS/BLUE SHIELD | Attending: Family Medicine | Admitting: Physical Therapy

## 2016-04-05 DIAGNOSIS — M25612 Stiffness of left shoulder, not elsewhere classified: Secondary | ICD-10-CM | POA: Diagnosis present

## 2016-04-05 DIAGNOSIS — M25512 Pain in left shoulder: Secondary | ICD-10-CM | POA: Diagnosis present

## 2016-04-05 NOTE — Therapy (Addendum)
[place], [place], [address] Phone: [phone]   Fax:  [phone]  Physical Therapy Treatment/Discharge Summary  Patient Details  Name: [name] MRN: [id number] Date of Birth: 1970-08-16 Referring Provider: Marchia Bond, MD  Encounter Date: 04/05/2016      PT End of Session - 04/05/16 0850    Visit Number 2   Number of Visits 17   Date for PT Re-Evaluation 05/24/16   Authorization Type BCBS- 30 visit limit   PT Start Time 0850   PT Stop Time 0939   PT Time Calculation (min) 49 min   Activity Tolerance Patient limited by pain   Behavior During Therapy [facility] for tasks assessed/performed      Past Medical History:  Diagnosis Date  . Abdominal pain    takes omeprazole  . Anemia   . Atypical chest pain   . Blood transfusion   . Chronic back pain greater than 3 months duration   . Chronic neck pain   . Depression   . Dyspnea   . Dyspnea on exertion   . Dysrhythmia    Dr Francetta Found no  . Headache(784.0)   . Hypertension    dr Huey Bienenstock  . Kidney stones   . 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 Seizures ([place])     Past Surgical History:  Procedure Laterality Date  . ABDOMINAL HYSTERECTOMY    . ABDOMINOPLASTY    . BACK SURGERY    . BREAST SURGERY     breast implants x 2  . CERVICAL SPINE SURGERY     2002  . CESAREAN SECTION     x2  . CHOLECYSTECTOMY  02/28/2011   Procedure: LAPAROSCOPIC CHOLECYSTECTOMY;  Surgeon: Joyice Faster. Cornett, MD;  Location: [place];  Service: General;  Laterality: N/A;  Laparoscopic cholecystectomy.  . colonoscopy    . HERNIA REPAIR  08/22/11   [facility]  . SHOULDER SURGERY     left humeral head replacement 2001  . TOTAL SHOULDER ARTHROPLASTY Left 12/05/2015   Procedure: TOTAL SHOULDER ARTHROPLASTY;  Surgeon: Marchia Bond, MD;  Location: [place];  Service: Orthopedics;  Laterality: Left;  Marland Kitchen VENTRAL HERNIA REPAIR  08/22/2011   Procedure:  LAPAROSCOPIC VENTRAL HERNIA;  Surgeon: Joyice Faster. Cornett, MD;  Location: [place];  Service: General;  Laterality: N/A;    There were no vitals filed for this visit.      Subjective Assessment - 04/05/16 0850    Subjective Pt reports she has a lot of pain today. Pain on anterior aspect of shoulder "tired pain"    Pain in axillary region is constant-sharp when reaching overhead. Feels much better with scapular retraction. C/o some pain at elbow as well.    Patient Stated Goals reaching, lifting, driving   Currently in Pain? Yes   Pain Score 9    Pain Location Shoulder   Pain Orientation Left;Anterior;Posterior   Pain Descriptors / Indicators Sharp;Sore;Aching   Aggravating Factors  driving, reaching, lifting,    Pain Relieving Factors scapular retraction                         [facility] Adult PT Treatment/Exercise - 04/05/16 0001      Shoulder Exercises: Prone   Retraction 20 reps   Retraction Limitations pt unable to lift arms to add ext     Shoulder Exercises: Standing   External Rotation 20 reps   Theraband Level (Shoulder External Rotation) Level  1 (Yellow)   Row 20 reps;10 reps   Theraband Level (Shoulder Row) Level 3 (Green)     Shoulder Exercises: ROM/Strengthening   UBE (Upper Arm Bike) retro 4'     Moist Heat Therapy   Number Minutes Moist Heat 10 Minutes   Moist Heat Location Shoulder     Manual Therapy   Manual Therapy Passive ROM;Joint mobilization   Joint Mobilization L rib mob from ant aspect, approx rib 4   Myofascial Release trigger point release & IASTM posterior shoulder; trigger point release L pectorais   Passive ROM manual PROM and strething in all directions                PT Education - 04/05/16 0856    Education provided Yes   Education Details periscapular strength to support shoulder, exercise form/rationale, rib effects on shoulder, trigger points and rationale for high pain levels   Person(s) Educated Patient   Methods  Demonstration;Explanation;Tactile cues;Verbal cues;Handout   Comprehension Verbalized understanding;Returned demonstration;Verbal cues required;Tactile cues required;Need further instruction          PT Short Term Goals - 03/25/16 2028      PT SHORT TERM GOAL #1   Title Pt will demo at least 10 deg increase in GHJ AROM flexion and abduction without shoulder hike by 3/23   Baseline see flowsheet   Time 3   Period Weeks   Status New     PT SHORT TERM GOAL #2   Title Pt will be able to drive without limitation by N/T in arm/hand   Baseline significant N/T with driving   Time 3   Period Weeks   Status New           PT Long Term Goals - 03/25/16 2030      PT LONG TERM GOAL #1   Title Pt will demo ability to lift 3# weight to highest cabinet without increased pain in shoulder in order to reach for objects at home by 4/27   Baseline unable at eval   Time 8   Period Weeks   Status New     PT LONG TERM GOAL #2   Title Pt will be able to carry her purse in her L hand without pain to improve functional use of arm   Baseline unable at eval   Time 8   Period Weeks   Status New     PT LONG TERM GOAL #3   Title FOTO to 52% ability to indicate significant improvement in functional ability   Baseline 42% ability at eval   Time 8   Period Weeks   Status New     PT LONG TERM GOAL #4   Title Flexion and abduction to 120 deg AROM to allow appropraite range for functional reach   Baseline see flowsheet   Time 8   Period Weeks   Status New     PT LONG TERM GOAL #5   Title All GHJ MMT to at least 4+/5 to indicate proper support to shoulder by surrounding musculature   Baseline see flowsheet   Time 8   Period Weeks   Status New               Plan - 04/05/16 0932    Clinical Impression Statement Pt presents to PT with high pain scores on anterior and posterior aspect of shoulder. Notable rotation of L rib, approx rib 4 that recreated concordant pain with pressure. Was  able to reduce rib  utilizing pressure in conjunction with deep breathing. Pt reported being dizzy after but when the dizziness resolved she reported feeling like she could breathe without pain. Pt is significantly limited in PROM at empty end feel.    PT Next Visit Plan check rib rotation, myofasical release   PT Home Exercise Plan scapular retraction, resisted ER yellow tband, flexion table stretch, AROM in mirror   Consulted and Agree with Plan of Care Patient      Patient will benefit from skilled therapeutic intervention in order to improve the following deficits and impairments:     Visit Diagnosis: Acute pain of left shoulder  Stiffness of left shoulder, not elsewhere classified     Problem List Patient Active Problem List   Diagnosis Date Noted  . 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 S/P shoulder replacement 12/05/2015  . Essential hypertension 05/30/2015  . Atypical chest pain 05/30/2015  . Dyspnea on exertion 05/30/2015  . Intractable nausea and vomiting 05/27/2013  . Hematuria 05/27/2013  . Left nephrolithiasis 05/27/2013  . Bacterial vaginosis 05/27/2013  . Left flank pain 05/27/2013  . Emesis, persistent 05/26/2013  . History of nephrolithiasis 04/22/2013  . SUI (stress urinary incontinence, female) 04/12/2013  . OAB (overactive bladder) 04/12/2013  . PTSD (post-traumatic stress disorder) 06/03/2011  . Anxiety disorder 05/31/2011  . 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 Biliary dyskinesia 02/11/2011    Juan Olthoff C. Mosella Kasa PT, DPT 04/05/16 10:30 AM   [facility] [address] [place], [place], [address] Phone: [phone]   Fax:  [phone]  Name: MEEKAH MATH MRN: [id number] Date of Birth: 01-28-1971  PHYSICAL THERAPY DISCHARGE SUMMARY  Visits from Start of Care: 2  Current functional level related to goals / functional  outcomes: See above   Remaining deficits: See above   Education / Equipment: Anatomy of condition, POC, HEP, exercise form/rationale  Plan: Patient agrees to discharge.  Patient goals were not met. Patient is being discharged due to not returning since the last visit.  ?????  Yousif Edelson C. Juquan Reznick PT, DPT 07/03/16 12:43 PM

## 2016-04-12 ENCOUNTER — Ambulatory Visit: Payer: BLUE CROSS/BLUE SHIELD | Admitting: Physical Therapy

## 2016-04-16 ENCOUNTER — Ambulatory Visit: Payer: BLUE CROSS/BLUE SHIELD | Admitting: Physical Therapy

## 2016-04-18 ENCOUNTER — Ambulatory Visit: Payer: BLUE CROSS/BLUE SHIELD | Admitting: Physical Therapy

## 2016-04-22 ENCOUNTER — Ambulatory Visit: Payer: BLUE CROSS/BLUE SHIELD | Admitting: Physical Therapy

## 2016-04-24 ENCOUNTER — Ambulatory Visit: Payer: BLUE CROSS/BLUE SHIELD | Admitting: Physical Therapy

## 2016-04-24 ENCOUNTER — Telehealth: Payer: Self-pay | Admitting: Physical Therapy

## 2016-04-24 NOTE — Telephone Encounter (Signed)
Interpreter spoke to patient who is still sick with the flu and fever that will not break. She will call prior to next appointment to cancel if she is still sick.

## 2016-04-30 ENCOUNTER — Ambulatory Visit: Payer: BLUE CROSS/BLUE SHIELD | Admitting: Physical Therapy

## 2016-05-02 ENCOUNTER — Ambulatory Visit: Payer: BLUE CROSS/BLUE SHIELD | Admitting: Physical Therapy

## 2016-06-12 ENCOUNTER — Encounter: Payer: Self-pay | Admitting: Gynecology

## 2016-06-30 ENCOUNTER — Encounter (HOSPITAL_COMMUNITY): Payer: Self-pay | Admitting: Emergency Medicine

## 2016-06-30 ENCOUNTER — Emergency Department (HOSPITAL_COMMUNITY): Payer: BLUE CROSS/BLUE SHIELD

## 2016-06-30 ENCOUNTER — Emergency Department (HOSPITAL_COMMUNITY)
Admission: EM | Admit: 2016-06-30 | Discharge: 2016-06-30 | Disposition: A | Discharge status: Home / Self Care | Payer: BLUE CROSS/BLUE SHIELD | Attending: Emergency Medicine | Admitting: Emergency Medicine

## 2016-06-30 DIAGNOSIS — Z96612 Presence of left artificial shoulder joint: Secondary | ICD-10-CM | POA: Diagnosis not present

## 2016-06-30 DIAGNOSIS — Z91040 Latex allergy status: Secondary | ICD-10-CM | POA: Diagnosis not present

## 2016-06-30 DIAGNOSIS — M5441 Lumbago with sciatica, right side: Secondary | ICD-10-CM | POA: Insufficient documentation

## 2016-06-30 DIAGNOSIS — M5442 Lumbago with sciatica, left side: Secondary | ICD-10-CM | POA: Diagnosis not present

## 2016-06-30 DIAGNOSIS — I10 Essential (primary) hypertension: Secondary | ICD-10-CM | POA: Diagnosis not present

## 2016-06-30 DIAGNOSIS — Z79899 Other long term (current) drug therapy: Secondary | ICD-10-CM | POA: Insufficient documentation

## 2016-06-30 DIAGNOSIS — G8929 Other chronic pain: Secondary | ICD-10-CM

## 2016-06-30 DIAGNOSIS — R531 Weakness: Secondary | ICD-10-CM | POA: Diagnosis present

## 2016-06-30 LAB — URINALYSIS, ROUTINE W REFLEX MICROSCOPIC
BILIRUBIN URINE: NEGATIVE
Glucose, UA: NEGATIVE mg/dL
KETONES UR: NEGATIVE mg/dL
LEUKOCYTES UA: NEGATIVE
Nitrite: NEGATIVE
PH: 8 (ref 5.0–8.0)
PROTEIN: NEGATIVE mg/dL
Specific Gravity, Urine: 1.013 (ref 1.005–1.030)

## 2016-06-30 MED ORDER — PREDNISONE 10 MG PO TABS: 40.0000 mg | ORAL_TABLET | Freq: Every day | ORAL | 0 refills | Status: DC

## 2016-06-30 MED ORDER — HYDROCODONE-ACETAMINOPHEN 5-325 MG PO TABS
1.0000 | ORAL_TABLET | Freq: Once | ORAL | Status: AC
  Administered 2016-06-30: 1 via ORAL
  Filled 2016-06-30: qty 1

## 2016-06-30 MED ORDER — PREDNISONE 20 MG PO TABS: 40.0000 mg | ORAL_TABLET | Freq: Every day | ORAL | 0 refills | Status: AC

## 2016-06-30 MED ORDER — PREDNISONE 20 MG PO TABS
60.0000 mg | ORAL_TABLET | Freq: Once | ORAL | Status: AC
  Administered 2016-06-30: 60 mg via ORAL
  Filled 2016-06-30: qty 3

## 2016-06-30 NOTE — ED Provider Notes (Signed)
Sign out from Avie Echevaria, PA-C at shift change  Patient with radicular-type back pain radiating down bilateral legs. Patient also reports she was not able to urinate since yesterday. Pending lumbar x-ray and UA, and is to be discharged home with prednisone with follow-up to PCP.  Lumbar x-ray shows no acute abnormality, but small nonobstructing left renal stone. Patient was aware of the stone. UA shows large hematuria. Patient states she has had persistent hematuria for the past 4 months and is being followed by a urologist for this. No new findings.  Patient discharged with prednisone x 4 days and follow-up to PCP. Patient also advised to continue follow-up with urologist. Strict return precautions discussed. Patient understands and agrees with plan. Patient vitals stable throughout ED course and discharged in satisfactory condition.   Frederica Kuster, PA-C 06/30/16 Yevette Edwards    Lajean Saver, MD 07/01/16 [phone]

## 2016-06-30 NOTE — ED Provider Notes (Signed)
[facility] DEPT Provider Note   CSN: [id number] Arrival date & time: 06/30/16  1225     History   Chief Complaint Chief Complaint  Patient presents with  . Weakness  . Urinary Retention    HPI [name] is a 46 y.o. female presenting with 3 weeks of worsening low back painRadiating down her lower extremities bilaterally as a stabbing burning sensation. She explains that she fell 3 times on Friday and 2 times yesterday due to intense pain shooting down and feeling weak in her legs. She denies a history of similar intensity symptoms. Denies history of low back pain. She tried 1 Percocet which didn't help. She endorses chills but no fever, nausea but no vomiting. She also reports that she has not been able to urinate since yesterday. She denies numbness, loss of bowel function, history of IV drug use, history of cancer, night sweats, unexplained weight loss. She denies recent surgery, prolonged immobilization, estrogen use, calf pain, neck swelling or other symptoms.  HPI  Past Medical History:  Diagnosis Date  . Abdominal pain    takes omeprazole  . Anemia   . Atypical chest pain   . Blood transfusion   . Chronic back pain greater than 3 months duration   . Chronic neck pain   . Depression   . Dyspnea   . Dyspnea on exertion   . Dysrhythmia    Dr Francetta Found no  . Headache(784.0)   . Hypertension    dr Huey Bienenstock  . Kidney stones   . 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 Seizures [facility])     Patient Active Problem List   Diagnosis Date Noted  . 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 S/P shoulder replacement 12/05/2015  . Essential hypertension 05/30/2015  . Atypical chest pain 05/30/2015  . Dyspnea on exertion 05/30/2015  . Intractable nausea and vomiting 05/27/2013  . Hematuria 05/27/2013  . Left nephrolithiasis 05/27/2013  . Bacterial vaginosis 05/27/2013  . Left flank pain 05/27/2013  . Emesis, persistent 05/26/2013   . History of nephrolithiasis 04/22/2013  . SUI (stress urinary incontinence, female) 04/12/2013  . OAB (overactive bladder) 04/12/2013  . PTSD (post-traumatic stress disorder) 06/03/2011  . Anxiety disorder 05/31/2011  . 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 Biliary dyskinesia 02/11/2011    Past Surgical History:  Procedure Laterality Date  . ABDOMINAL HYSTERECTOMY    . ABDOMINOPLASTY    . BACK SURGERY    . BREAST SURGERY     breast implants x 2  . CERVICAL SPINE SURGERY     2002  . CESAREAN SECTION     x2  . CHOLECYSTECTOMY  02/28/2011   Procedure: LAPAROSCOPIC CHOLECYSTECTOMY;  Surgeon: Joyice Faster. Cornett, MD;  Location: [place];  Service: General;  Laterality: N/A;  Laparoscopic cholecystectomy.  . colonoscopy    . HERNIA REPAIR  08/22/11   [facility]  . SHOULDER SURGERY     left humeral head replacement 2001  . TOTAL SHOULDER ARTHROPLASTY Left 12/05/2015   Procedure: TOTAL SHOULDER ARTHROPLASTY;  Surgeon: Marchia Bond, MD;  Location: [place];  Service: Orthopedics;  Laterality: Left;  Marland Kitchen VENTRAL HERNIA REPAIR  08/22/2011   Procedure: LAPAROSCOPIC VENTRAL HERNIA;  Surgeon: Joyice Faster. Cornett, MD;  Location: [place];  Service: General;  Laterality: N/A;    OB History    Gravida Para Term Preterm AB Living   0  SAB TAB Ectopic Multiple Live Births                   Home Medications    Prior to Admission medications   Medication Sig Start Date End Date Taking? Authorizing Provider  ALPRAZolam Duanne Moron) 0.5 MG tablet Take 0.5 mg by mouth daily as needed for anxiety.    [provider]  cycloSPORINE (RESTASIS) 0.05 % ophthalmic emulsion Place 1 drop into both eyes 2 (two) times daily.    [provider]  dexlansoprazole (DEXILANT) 60 MG capsule Take 60 mg by mouth daily.    [provider]  lisinopril (PRINIVIL,ZESTRIL) 40 MG tablet Take 40 mg by mouth daily.    [provider]  loratadine  (CLARITIN) 10 MG tablet Take 10 mg by mouth at bedtime.    [provider]  ondansetron (ZOFRAN) 4 MG tablet Take 1 tablet (4 mg total) by mouth every 8 (eight) hours as needed for nausea or vomiting. Patient not taking: Reported on 03/25/2016 12/05/15   Marchia Bond, MD  oxyCODONE-acetaminophen (PERCOCET) 10-325 MG tablet Take 1-2 tablets by mouth every 6 (six) hours as needed for pain. MAXIMUM TOTAL ACETAMINOPHEN DOSE IS 4000 MG PER DAY Patient not taking: Reported on 03/25/2016 12/05/15   Marchia Bond, MD  pantoprazole (PROTONIX) 40 MG tablet Take 40 mg by mouth daily.    [provider]  predniSONE (DELTASONE) 10 MG tablet Take 4 tablets (40 mg total) by mouth daily. 06/30/16 07/04/16  Avie Echevaria B, PA-C  ranitidine (ZANTAC) 300 MG tablet Take 300 mg by mouth at bedtime.    [provider]  sennosides-docusate sodium (SENOKOT-S) 8.6-50 MG tablet Take 2 tablets by mouth daily. Patient not taking: Reported on 03/25/2016 12/05/15   Marchia Bond, MD  tiZANidine (ZANAFLEX) 4 MG tablet Take 4 mg by mouth daily as needed for muscle spasms.    [provider]  topiramate (TOPAMAX) 25 MG tablet Take 50 mg by mouth 2 (two) times daily as needed (headaches).    [provider]    Family History Family History  Problem Relation Age of Onset  . Cancer Mother        colon, skin  . Anesthesia problems Mother   . Heart disease Father   . Cancer Maternal Aunt        OVARIAN  . Breast cancer Maternal Aunt     Social History Social History  Substance Use Topics  . Smoking status: Never Smoker  . Smokeless tobacco: Never Used  . Alcohol use No     Allergies   Diclofenac; Latex; and Naproxen   Review of Systems Review of Systems  Constitutional: Positive for chills. Negative for fatigue, fever and unexpected weight change.  Respiratory: Negative for cough, chest tightness, shortness of breath, wheezing and stridor.   Cardiovascular: Positive  for leg swelling. Negative for chest pain and palpitations.  Gastrointestinal: Positive for nausea. Negative for abdominal distention, abdominal pain, blood in stool, diarrhea and vomiting.  Genitourinary: Positive for decreased urine volume and difficulty urinating. Negative for dysuria, flank pain, frequency, hematuria, pelvic pain, vaginal bleeding, vaginal discharge and vaginal pain.  Musculoskeletal: Positive for back pain and myalgias. Negative for arthralgias and neck stiffness.  Skin: Negative for color change, pallor, rash and wound.  Neurological: Positive for weakness. Negative for dizziness, seizures, syncope, light-headedness and numbness.     Physical Exam Updated Vital Signs BP 134/80   Pulse 89   Temp 98.1 F (36.7 C) (Oral)  Resp 18   Ht 5\' 9"  (1.753 m)   Wt 83.9 kg (185 lb)   SpO2 100%   BMI 27.32 kg/m   Physical Exam  Constitutional: She appears well-developed and well-nourished. No distress.  Afebrile, nontoxic-appearing, lying comfortably in bed in no acute distress. Patient does appear to have be having some discomfort  HENT:  Head: Normocephalic and atraumatic.  Eyes: EOM are normal.  Neck: Normal range of motion.  Cardiovascular: Normal rate, regular rhythm, normal heart sounds and intact distal pulses.  Exam reveals no gallop.   No murmur heard. Pulmonary/Chest: Effort normal and breath sounds normal. No respiratory distress. She has no wheezes. She has no rales.  Abdominal: She exhibits no distension.  Musculoskeletal: Normal range of motion. She exhibits tenderness. She exhibits no edema or deformity.  Patient had midline tenderness to palpation of the lumbar spine. Tenderness palpation of the posterior right leg.  Neurological: She is alert. No sensory deficit. She exhibits normal muscle tone. Coordination normal.  Patient is neurovascularly intact bilaterally in lower extremities. She was observed ambulating in the room. She was walking slow due to  pain but able to ambulate on her own. 5 out of 5 strength to resisted hip flexion, knee flexion and extension, plantar flexion and dorsiflexion. Light touch sensation and proprioception intact and equal bilaterally.  Skin: Skin is warm and dry. No rash noted. She is not diaphoretic. No erythema. No pallor.  Psychiatric: She has a normal mood and affect.  Nursing note and vitals reviewed.    ED Treatments / Results  Labs (all labs ordered are listed, but only abnormal results are displayed) Labs Reviewed  URINALYSIS, ROUTINE W REFLEX MICROSCOPIC    EKG  EKG Interpretation None       Radiology Dg Lumbar Spine Complete  Result Date: 06/30/2016 CLINICAL DATA:  Several falls recently with low back pain, initial encounter EXAM: LUMBAR SPINE - COMPLETE 4+ VIEW COMPARISON:  None. FINDINGS: Five lumbar type vertebral bodies are well visualized. Vertebral body height is well maintained. Mild osteophytic changes are seen. No pars defects are noted. No anterolisthesis is seen. Small upper pole left renal stone is noted measuring approximately 4 mm. No significant disc pathology is seen. IMPRESSION: No acute abnormality in the lumbar spine. Small nonobstructing left renal stone. Electronically Signed   By: Inez Catalina M.D.   On: 06/30/2016 16:11    Procedures Procedures (including critical care time)  Medications Ordered in ED Medications  predniSONE (DELTASONE) tablet 60 mg (60 mg Oral Given 06/30/16 1610)  HYDROcodone-acetaminophen (NORCO/VICODIN) 5-325 MG per tablet 1 tablet (1 tablet Oral Given 06/30/16 1611)     Initial Impression / Assessment and Plan / ED Course  I have reviewed the triage vital signs and the nursing notes.  Pertinent labs & imaging results that were available during my care of the patient were reviewed by me and considered in my medical decision making (see chart for details).     Patient presents with worsening low back pain for the past 3 weeks with sharp  radiating sensation in lower extremities bilaterally. She states the pain was so bad that she lost her footing and fell. She denies any other injuries. She also reported difficulty urinating since yesterday. On exam she has midline tenderness to palpation of the lumbar spine. Ordered plain films and bladder scan. No unilateral swelling or calf pain. Score of 0 on wells criteria for DVT. Patient's pain was managed while in the ED.  Patient care was  transferred at end of shift to [facility], pending lumbar plain films and bladder scan.   Anticipate discharge home with prednisone burst and close follow-up with PCP if negative. Provided patient with instructions on symptomatic relief. Provided strict return precautions and advised to return to the emergency department if experiencing any new or worsening symptoms.  Patient was discussed with Dr. Ashok Cordia who agrees with assessment and plan. Final Clinical Impressions(s) / ED Diagnoses   Final diagnoses:  Chronic bilateral low back pain with bilateral sciatica    New Prescriptions New Prescriptions   PREDNISONE (DELTASONE) 10 MG TABLET    Take 4 tablets (40 mg total) by mouth daily.     Emeline General, PA-C 06/30/16 1646    Lajean Saver, MD 07/01/16 [phone]

## 2016-06-30 NOTE — Discharge Instructions (Signed)
As discussed, take your prednisone for the next 4 days starting tomorrow. Modify your exercises and posture, apply heat to the area. Follow up with your primary care provider.  Return if you experience worsening symptoms including loss of sensation, loss of bowel or bladder function, fever, chills or any other new concerning symptoms in the meantime

## 2016-06-30 NOTE — ED Notes (Signed)
Bladder scan showed 233ml in bladder

## 2016-06-30 NOTE — ED Notes (Signed)
Pt at xray

## 2016-06-30 NOTE — ED Triage Notes (Signed)
Pt to ER for frequent falls onset Friday due to weakness in bilateral lower extremities. Pt also reports difficulty using restroom. Denies incontinence. Reports tingling in hands. Hx of cervical spine fusion 14 years ago.

## 2016-11-18 IMAGING — CR DG CHEST 2V
2 series · 2 of 2 positions shown · non-contrast
Comparison: 12/30/2012

CLINICAL DATA: Cough and fever for 2 days.  Chest pain for 3 these.

EXAM:
CHEST  2 VIEW

[chest pa]
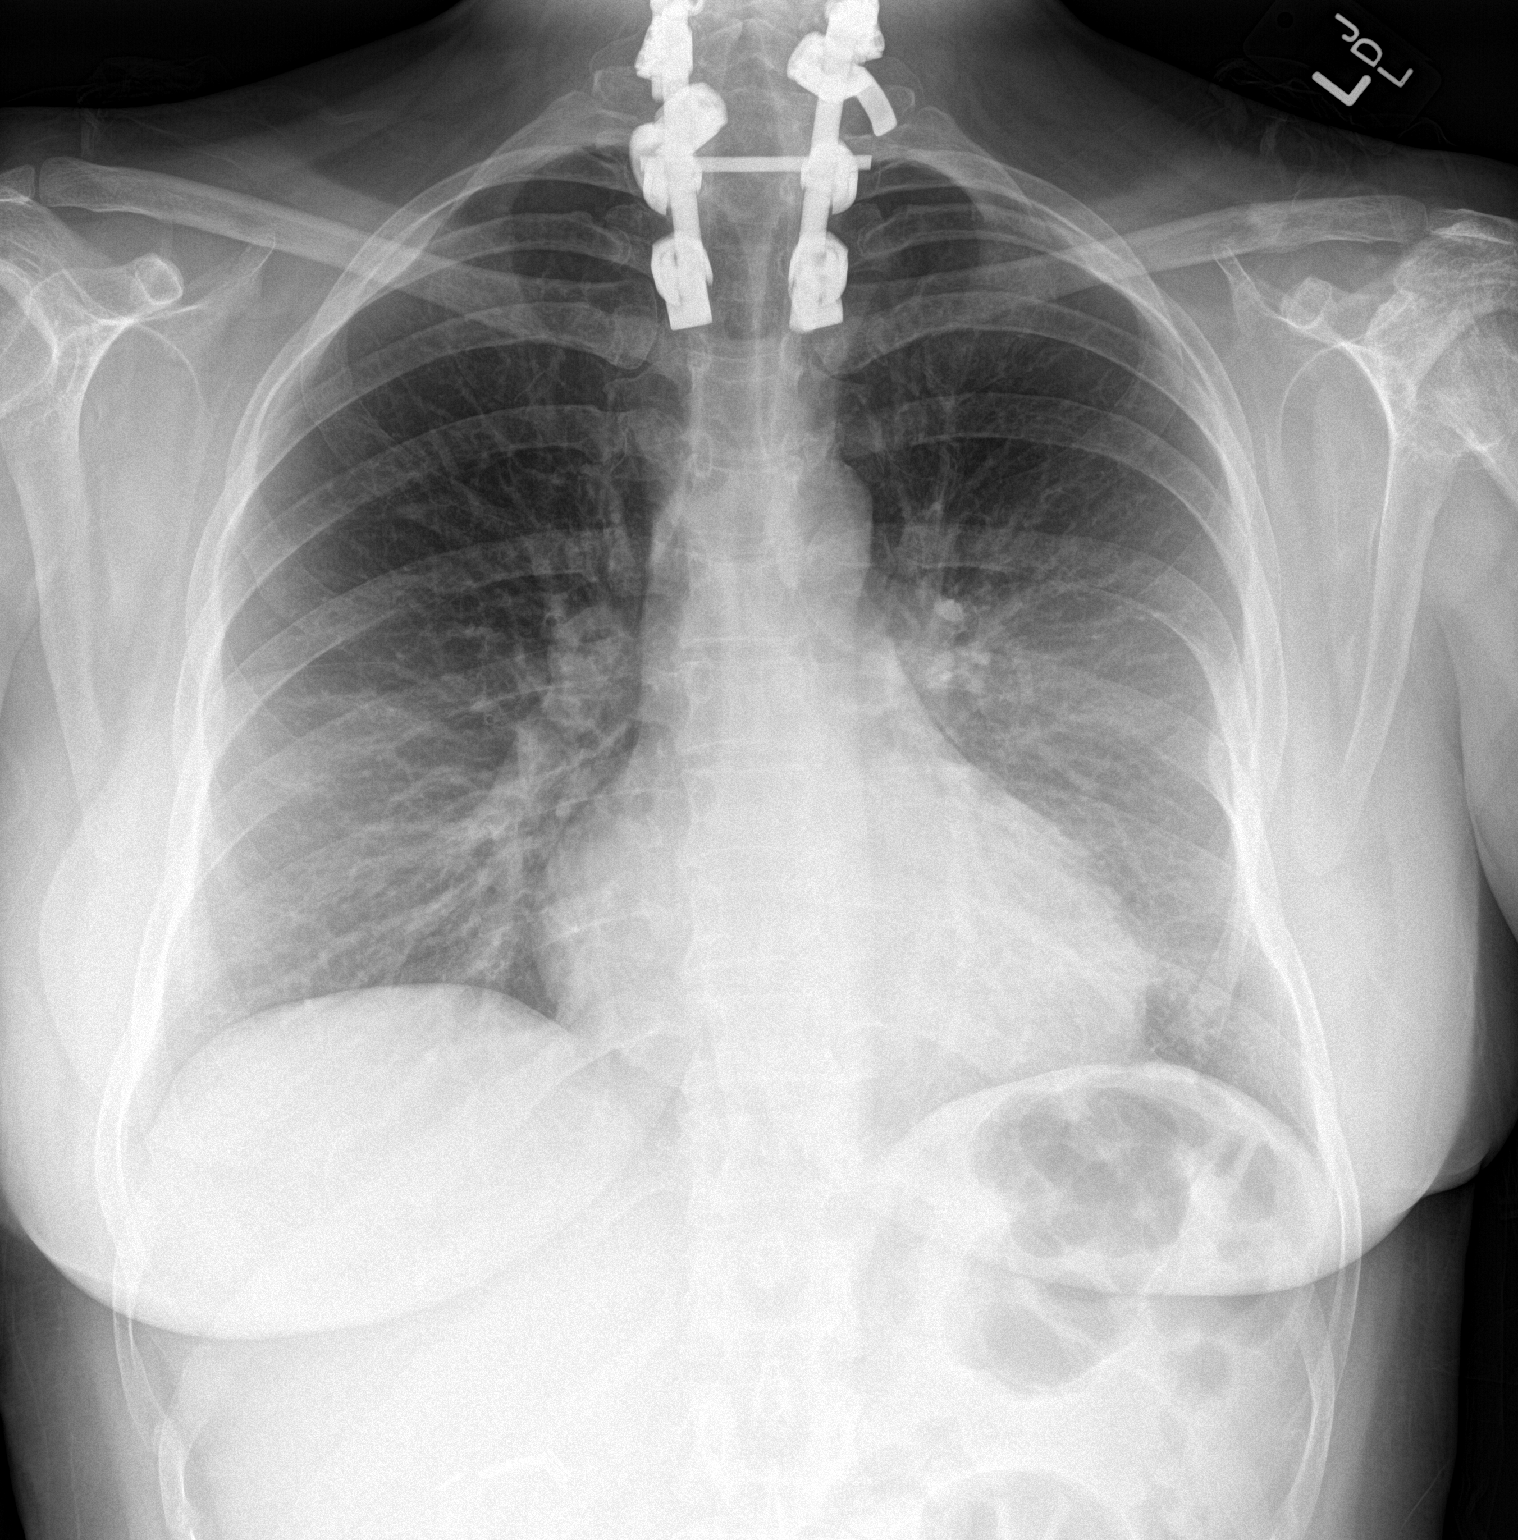

[chest lat]
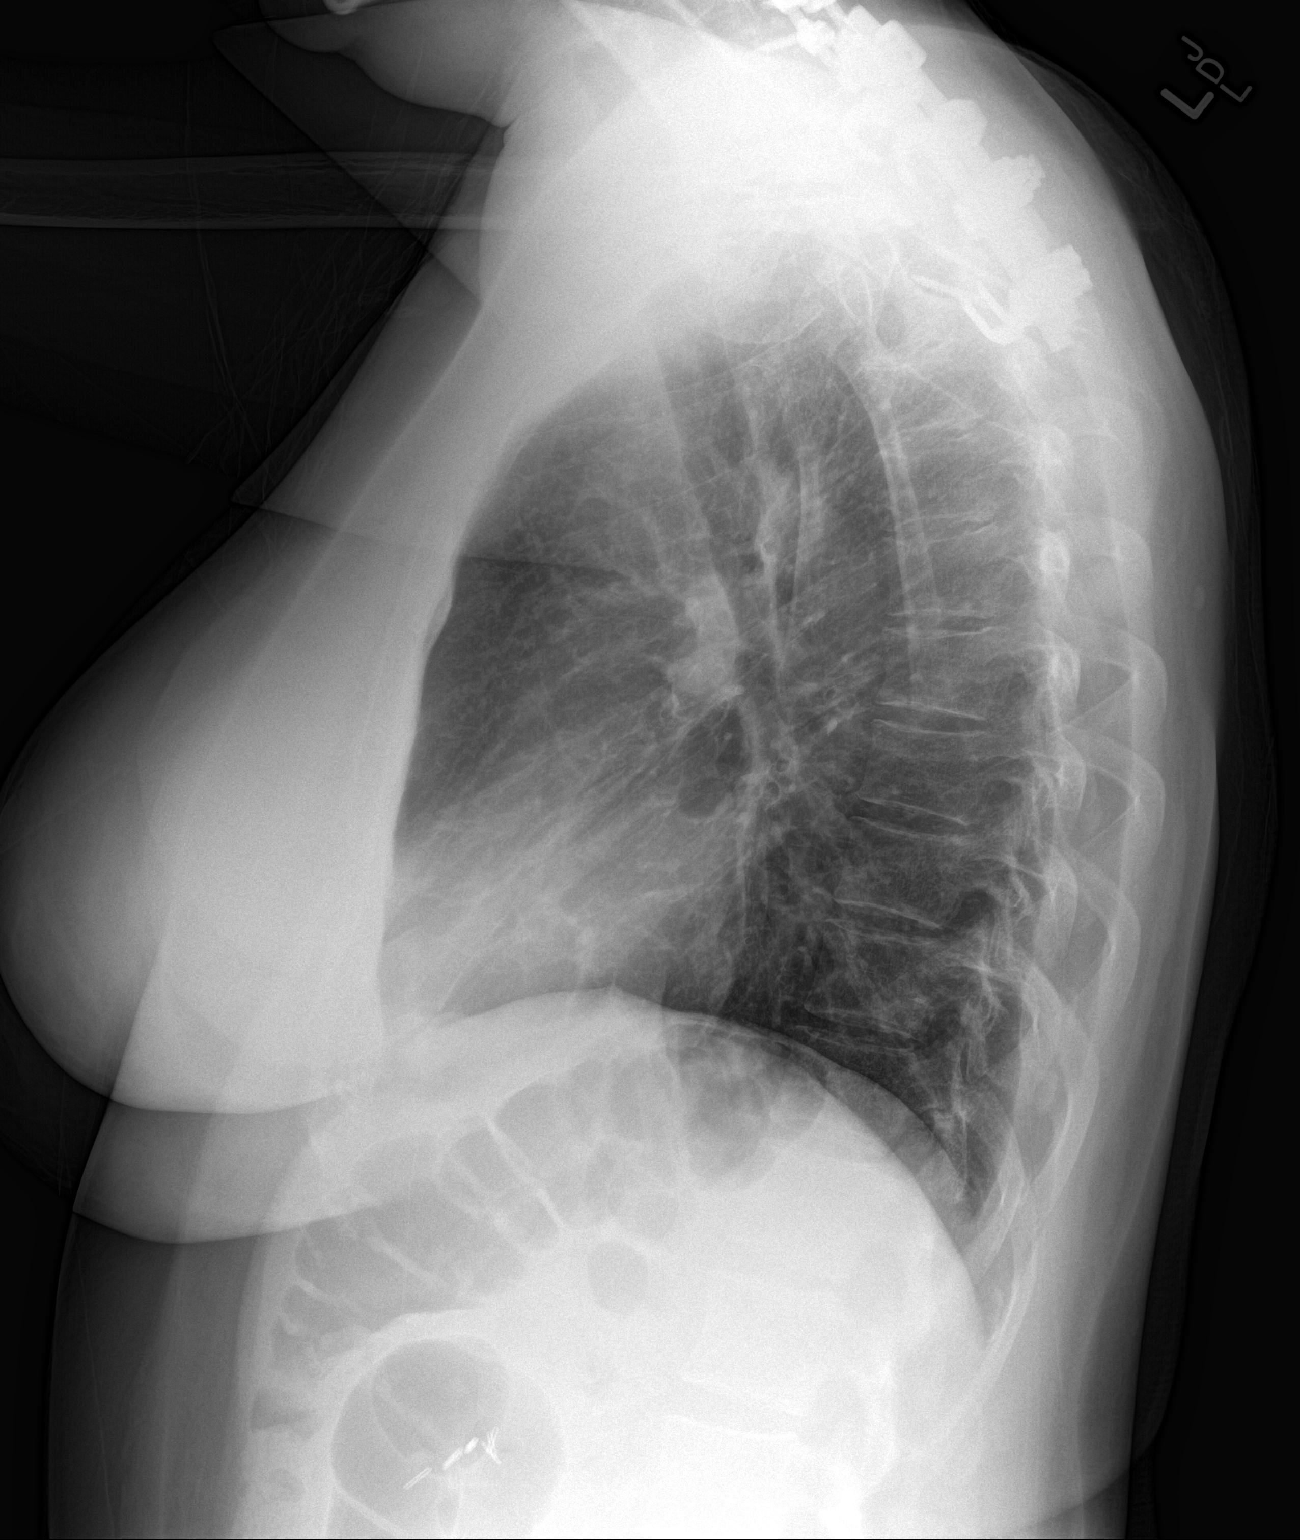

[2 of 2 positions shown; findings below may reference images not displayed]

FINDINGS: Stable cervical thoracic fusion hardware. The heart is normal in
size and stable. The mediastinal and hilar contours are unchanged.
Minimal wall streaky basilar scarring changes but no infiltrates,
edema or effusions. Remote healed left rib fractures are noted. The
thoracic vertebral bodies are intact.
IMPRESSION: No acute cardiopulmonary findings.

## 2016-12-11 ENCOUNTER — Other Ambulatory Visit: Payer: Self-pay

## 2016-12-11 ENCOUNTER — Emergency Department (HOSPITAL_BASED_OUTPATIENT_CLINIC_OR_DEPARTMENT_OTHER)
Admission: EM | Admit: 2016-12-11 | Discharge: 2016-12-11 | Disposition: A | Discharge status: Home / Self Care | Payer: BLUE CROSS/BLUE SHIELD | Attending: Emergency Medicine | Admitting: Emergency Medicine

## 2016-12-11 ENCOUNTER — Encounter (HOSPITAL_BASED_OUTPATIENT_CLINIC_OR_DEPARTMENT_OTHER): Payer: Self-pay

## 2016-12-11 ENCOUNTER — Emergency Department (HOSPITAL_BASED_OUTPATIENT_CLINIC_OR_DEPARTMENT_OTHER): Payer: BLUE CROSS/BLUE SHIELD

## 2016-12-11 DIAGNOSIS — R05 Cough: Secondary | ICD-10-CM | POA: Diagnosis present

## 2016-12-11 DIAGNOSIS — Z91040 Latex allergy status: Secondary | ICD-10-CM | POA: Diagnosis not present

## 2016-12-11 DIAGNOSIS — G8929 Other chronic pain: Secondary | ICD-10-CM | POA: Diagnosis not present

## 2016-12-11 DIAGNOSIS — J209 Acute bronchitis, unspecified: Secondary | ICD-10-CM | POA: Insufficient documentation

## 2016-12-11 DIAGNOSIS — I10 Essential (primary) hypertension: Secondary | ICD-10-CM | POA: Insufficient documentation

## 2016-12-11 DIAGNOSIS — D649 Anemia, unspecified: Secondary | ICD-10-CM | POA: Insufficient documentation

## 2016-12-11 DIAGNOSIS — Z79899 Other long term (current) drug therapy: Secondary | ICD-10-CM | POA: Insufficient documentation

## 2016-12-11 DIAGNOSIS — R0789 Other chest pain: Secondary | ICD-10-CM | POA: Diagnosis not present

## 2016-12-11 MED ORDER — AZITHROMYCIN 250 MG PO TABS: ORAL_TABLET | ORAL | 0 refills | Status: DC

## 2016-12-11 NOTE — Discharge Instructions (Signed)
Zithromax as prescribed.  Continue over-the-counter medications as needed for symptomatic relief.  Follow-up with primary doctor if not improving in the next week.

## 2016-12-11 NOTE — ED Notes (Signed)
C/o cough x 1 month.  Pain to left ribs and left upper back

## 2016-12-11 NOTE — ED Provider Notes (Signed)
[facility] EMERGENCY DEPARTMENT Provider Note   CSN: [id number] Arrival date & time: 12/11/16  1925     History   Chief Complaint Chief Complaint  Patient presents with  . Cough    HPI [name] is a 46 y.o. female.  Patient is a 46 year old female with history of back surgery, kidney stones, seizures presenting for evaluation of chest congestion and cough.  This is been present for the past month.  It has been intermittently productive.  She is reporting some discomfort in the left side of her chest but denies any difficulty breathing.  She denies any leg pain or swelling.   The history is provided by the patient.  Cough  This is a new problem. Episode onset: 1 month ago. The problem occurs constantly. The problem has not changed since onset.The cough is productive of sputum. There has been no fever. She has tried decongestants for the symptoms.    Past Medical History:  Diagnosis Date  . Abdominal pain    takes omeprazole  . Anemia   . Atypical chest pain   . Blood transfusion   . Chronic back pain greater than 3 months duration   . Chronic neck pain   . Depression   . Dyspnea   . Dyspnea on exertion   . Dysrhythmia    Dr Francetta Found no  . Headache(784.0)   . Hypertension    dr Huey Bienenstock  . Kidney stones   . 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 Seizures [facility])     Patient Active Problem List   Diagnosis Date Noted  . 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 S/P shoulder replacement 12/05/2015  . Essential hypertension 05/30/2015  . Atypical chest pain 05/30/2015  . Dyspnea on exertion 05/30/2015  . Intractable nausea and vomiting 05/27/2013  . Hematuria 05/27/2013  . Left nephrolithiasis 05/27/2013  . Bacterial vaginosis 05/27/2013  . Left flank pain 05/27/2013  . Emesis, persistent 05/26/2013  . History of nephrolithiasis 04/22/2013  . SUI (stress urinary incontinence, female) 04/12/2013  . OAB  (overactive bladder) 04/12/2013  . PTSD (post-traumatic stress disorder) 06/03/2011  . Anxiety disorder 05/31/2011  . 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 Biliary dyskinesia 02/11/2011    Past Surgical History:  Procedure Laterality Date  . ABDOMINAL HYSTERECTOMY    . ABDOMINOPLASTY    . BACK SURGERY    . BREAST SURGERY     breast implants x 2  . CERVICAL SPINE SURGERY     2002  . CESAREAN SECTION     x2  . colonoscopy    . HERNIA REPAIR  08/22/11   [facility]  . SHOULDER SURGERY     left humeral head replacement 2001    OB History    Gravida Para Term Preterm AB Living   0             SAB TAB Ectopic Multiple Live Births                   Home Medications    Prior to Admission medications   Medication Sig Start Date End Date Taking? Authorizing Provider  ALPRAZolam Duanne Moron) 0.5 MG tablet Take 0.5 mg by mouth daily as needed for anxiety.    [provider]  cycloSPORINE (RESTASIS) 0.05 % ophthalmic emulsion Place 1 drop into both eyes 2 (two) times daily.    [provider]  dexlansoprazole (DEXILANT) 60 MG capsule Take 60  mg by mouth daily.    [provider]  lisinopril (PRINIVIL,ZESTRIL) 40 MG tablet Take 40 mg by mouth daily.    [provider]  loratadine (CLARITIN) 10 MG tablet Take 10 mg by mouth at bedtime.    [provider]  ondansetron (ZOFRAN) 4 MG tablet Take 1 tablet (4 mg total) by mouth every 8 (eight) hours as needed for nausea or vomiting. Patient not taking: Reported on 03/25/2016 12/05/15   Marchia Bond, MD  oxyCODONE-acetaminophen (PERCOCET) 10-325 MG tablet Take 1-2 tablets by mouth every 6 (six) hours as needed for pain. MAXIMUM TOTAL ACETAMINOPHEN DOSE IS 4000 MG PER DAY Patient not taking: Reported on 03/25/2016 12/05/15   Marchia Bond, MD  pantoprazole (PROTONIX) 40 MG tablet Take 40 mg by mouth daily.    [provider]  ranitidine (ZANTAC) 300  MG tablet Take 300 mg by mouth at bedtime.    [provider]  sennosides-docusate sodium (SENOKOT-S) 8.6-50 MG tablet Take 2 tablets by mouth daily. Patient not taking: Reported on 03/25/2016 12/05/15   Marchia Bond, MD  tiZANidine (ZANAFLEX) 4 MG tablet Take 4 mg by mouth daily as needed for muscle spasms.    [provider]  topiramate (TOPAMAX) 25 MG tablet Take 50 mg by mouth 2 (two) times daily as needed (headaches).    [provider]    Family History Family History  Problem Relation Age of Onset  . Cancer Mother        colon, skin  . Anesthesia problems Mother   . Heart disease Father   . Cancer Maternal Aunt        OVARIAN  . Breast cancer Maternal Aunt     Social History Social History   Tobacco Use  . Smoking status: Never Smoker  . Smokeless tobacco: Never Used  Substance Use Topics  . Alcohol use: No  . Drug use: No     Allergies   Diclofenac; Latex; and Naproxen   Review of Systems Review of Systems  Respiratory: Positive for cough.   All other systems reviewed and are negative.    Physical Exam Updated Vital Signs BP (!) 145/115 (BP Location: Left Arm)   Pulse 93   Temp 98.2 F (36.8 C) (Oral)   Resp 18   Wt 91.2 kg (201 lb)   SpO2 99%   BMI 29.68 kg/m   Physical Exam  Constitutional: She is oriented to person, place, and time. She appears well-developed and well-nourished. No distress.  HENT:  Head: Normocephalic and atraumatic.  Neck: Normal range of motion. Neck supple.  Cardiovascular: Normal rate and regular rhythm. Exam reveals no gallop and no friction rub.  No murmur heard. Pulmonary/Chest: Effort normal and breath sounds normal. No respiratory distress. She has no wheezes.  Abdominal: Soft. Bowel sounds are normal. She exhibits no distension. There is no tenderness.  Musculoskeletal: Normal range of motion.  Neurological: She is alert and oriented to person, place, and time.  Skin: Skin is warm and  dry. She is not diaphoretic.  Nursing note and vitals reviewed.    ED Treatments / Results  Labs (all labs ordered are listed, but only abnormal results are displayed) Labs Reviewed - No data to display  EKG  EKG Interpretation None       Radiology Dg Chest 2 View  Result Date: 12/11/2016 CLINICAL DATA:  Pain to rib area and LEFT upper back. EXAM: CHEST  2 VIEW COMPARISON:  02/23/2015. FINDINGS: The heart size and  mediastinal contours are within normal limits. Both lungs are clear. The visualized skeletal structures are unremarkable. Previous cervicothoracic fusion appears unchanged. IMPRESSION: No active cardiopulmonary disease. Electronically Signed   By: Staci Righter M.D.   On: 12/11/2016 20:26    Procedures Procedures (including critical care time)  Medications Ordered in ED Medications - No data to display   Initial Impression / Assessment and Plan / ED Course  I have reviewed the triage vital signs and the nursing notes.  Pertinent labs & imaging results that were available during my care of the patient were reviewed by me and considered in my medical decision making (see chart for details).  Patient with persistent URI symptoms for 1 month unrelieved with over-the-counter medications.  She will be prescribed an antibiotic due to the duration of symptoms.  To follow-up with primary doctor if not improving.  Final Clinical Impressions(s) / ED Diagnoses   Final diagnoses:  None    ED Discharge Orders    None       Veryl Speak, MD 12/11/16 2057

## 2016-12-11 NOTE — ED Triage Notes (Addendum)
C/o nonprod cough x 2 months-pain to left rib area and left upper back-NAD-steady gait

## 2016-12-23 ENCOUNTER — Other Ambulatory Visit: Payer: Self-pay | Admitting: Urology

## 2017-01-01 ENCOUNTER — Ambulatory Visit (INDEPENDENT_AMBULATORY_CARE_PROVIDER_SITE_OTHER): Payer: BLUE CROSS/BLUE SHIELD

## 2017-01-01 ENCOUNTER — Encounter (HOSPITAL_COMMUNITY): Payer: Self-pay | Admitting: Emergency Medicine

## 2017-01-01 ENCOUNTER — Ambulatory Visit (HOSPITAL_COMMUNITY)
Admission: EM | Admit: 2017-01-01 | Discharge: 2017-01-01 | Disposition: A | Discharge status: Home / Self Care | Payer: BLUE CROSS/BLUE SHIELD | Attending: Family Medicine | Admitting: Family Medicine

## 2017-01-01 DIAGNOSIS — S62524A Nondisplaced fracture of distal phalanx of right thumb, initial encounter for closed fracture: Secondary | ICD-10-CM

## 2017-01-01 DIAGNOSIS — M79644 Pain in right finger(s): Secondary | ICD-10-CM

## 2017-01-01 DIAGNOSIS — W231XXA Caught, crushed, jammed, or pinched between stationary objects, initial encounter: Secondary | ICD-10-CM

## 2017-01-01 DIAGNOSIS — S6991XA Unspecified injury of right wrist, hand and finger(s), initial encounter: Secondary | ICD-10-CM

## 2017-01-01 DIAGNOSIS — S6990XA Unspecified injury of unspecified wrist, hand and finger(s), initial encounter: Secondary | ICD-10-CM

## 2017-01-01 NOTE — Discharge Instructions (Signed)
Please wear the splint we have given you until you follow up with a hand surgeon for evaluation. You may use over the counter ibuprofen or acetaminophen as needed.

## 2017-01-01 NOTE — ED Provider Notes (Signed)
[place]   [id number] 01/01/17 Arrival Time: 6606  ASSESSMENT & PLAN:  1. Thumb injury, initial encounter   2. Nondisplaced fracture of distal phalanx of right thumb, initial encounter for closed fracture    OTC analgesics. Thumb spica splint applied. To schedule f/u with orthopaedic (hand) within the next week. Will f/u here if needed. Work note given.  Reviewed expectations re: course of current medical issues. Questions answered. Outlined signs and symptoms indicating need for more acute intervention. Patient verbalized understanding. After Visit Summary given.   SUBJECTIVE:  [name] is a 46 y.o. female who reports pain of her R thumb. Reports shutting thumb in car door yesterday. Very painful since. No extremity sensation changes or weakness. Ibuprofen with mild relief. No extremity sensation changes or weakness. Certain movements exacerbate.  ROS: As per HPI.   OBJECTIVE:  Vitals:   01/01/17 1123  BP: (!) 157/91  Pulse: 78  Resp: 18  Temp: 98.2 F (36.8 C)  TempSrc: Oral  SpO2: 100%    General appearance: alert; no distress Extremities: no cyanosis or edema; symmetrical with no gross deformities; tenderness over her distal R thumb; decreased ROM secondary to reported pain CV: normal extremity capillary refill Skin: warm and dry Neurologic: normal gait; normal symmetric reflexes in all extremities; normal sensation Psychological: alert and cooperative; normal mood and affect  Imaging: Dg Finger Thumb Right  Result Date: 01/01/2017 CLINICAL DATA:  Crush injury in car door yesterday. EXAM: RIGHT THUMB 2+V COMPARISON:  None. FINDINGS: Nondisplaced fracture of the distal tuft. No joint injury. Otherwise negative. IMPRESSION: Nondisplaced fracture of the distal tuft. Electronically Signed   By: Nelson Chimes M.D.   On: 01/01/2017 12:08    Allergies  Allergen Reactions  . Diclofenac Anaphylaxis  . Latex Anaphylaxis  . Naproxen  Anaphylaxis    Past Medical History:  Diagnosis Date  . Abdominal pain    takes omeprazole  . Anemia   . Atypical chest pain   . Blood transfusion   . Chronic back pain greater than 3 months duration   . Chronic neck pain   . Depression   . Dyspnea   . Dyspnea on exertion   . Dysrhythmia    Dr Francetta Found no  . Headache(784.0)   . Hypertension    dr Huey Bienenstock  . Kidney stones   . 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 Seizures ([place])    Social History   Socioeconomic History  . Marital status: Married    Spouse name: Not on file  . Number of children: Not on file  . Years of education: Not on file  . Highest education level: Not on file  Social Needs  . Financial resource strain: Not on file  . Food insecurity - worry: Not on file  . Food insecurity - inability: Not on file  . Transportation needs - medical: Not on file  . Transportation needs - non-medical: Not on file  Occupational History  . Not on file  Tobacco Use  . Smoking status: Never Smoker  . Smokeless tobacco: Never Used  Substance and Sexual Activity  . Alcohol use: No  . Drug use: No  . Sexual activity: Yes    Birth control/protection: Surgical  Other Topics Concern  . Not on file  Social History Narrative  . Not on file   Family History  Problem Relation Age of Onset  . Cancer Mother        colon, skin  .  Anesthesia problems Mother   . Heart disease Father   . Cancer Maternal Aunt        OVARIAN  . Breast cancer Maternal Aunt    Past Surgical History:  Procedure Laterality Date  . ABDOMINAL HYSTERECTOMY    . ABDOMINOPLASTY    . BACK SURGERY    . BREAST SURGERY     breast implants x 2  . CERVICAL SPINE SURGERY     2002  . CESAREAN SECTION     x2  . CHOLECYSTECTOMY  02/28/2011   Procedure: LAPAROSCOPIC CHOLECYSTECTOMY;  Surgeon: Joyice Faster. Cornett, MD;  Location: [facility];  Service: General;  Laterality: N/A;  Laparoscopic cholecystectomy.  . colonoscopy    . HERNIA  REPAIR  08/22/11   [facility]  . SHOULDER SURGERY     left humeral head replacement 2001  . TOTAL SHOULDER ARTHROPLASTY Left 12/05/2015   Procedure: TOTAL SHOULDER ARTHROPLASTY;  Surgeon: Marchia Bond, MD;  Location: [facility];  Service: Orthopedics;  Laterality: Left;  Marland Kitchen VENTRAL HERNIA REPAIR  08/22/2011   Procedure: LAPAROSCOPIC VENTRAL HERNIA;  Surgeon: Joyice Faster. Cornett, MD;  Location: [facility];  Service: General;  Laterality: Ginette Pitman, MD 01/01/17 1304

## 2017-01-01 NOTE — ED Triage Notes (Signed)
Pt here after slamming right thumb in car door last night; pt with pain in finger and up arm

## 2017-01-09 NOTE — Patient Instructions (Addendum)
[place]  01/09/2017   Your procedure is scheduled on: 01-14-17  Report to [facility] Main  Entrance Take [place]  elevators to 3rd floor to  [place] at [facility].   Call this number if you have problems the morning of surgery [phone]    Remember: ONLY 1 PERSON MAY GO WITH YOU TO SHORT STAY TO GET  READY MORNING OF YOUR SURGERY.  Do not eat food or drink liquids :After Midnight.     Take these medicines the morning of surgery with A SIP OF WATER: tylenol if needed, dexlansoprazole, inhaler if needed (may bring to hospital)                                 You may not have any metal on your body including hair pins and              piercings  Do not wear jewelry, make-up, lotions, powders or perfumes, deodorant             Do not wear nail polish.  Do not shave  48 hours prior to surgery.       Do not bring valuables to the hospital. [place].  Contacts, dentures or bridgework may not be worn into surgery.      Patients discharged the day of surgery will not be allowed to drive home.  Name and phone number of your driver:  Special Instructions: N/A              Please read over the following fact sheets you were given: _____________________________________________________________________            [facility] - Preparing for Surgery Before surgery, you can play an important role.  Because skin is not sterile, your skin needs to be as free of germs as possible.  You can reduce the number of germs on your skin by washing with CHG (chlorahexidine gluconate) soap before surgery.  CHG is an antiseptic cleaner which kills germs and bonds with the skin to continue killing germs even after washing. Please DO NOT use if you have an allergy to CHG or antibacterial soaps.  If your skin becomes reddened/irritated stop using the CHG and inform your nurse when you arrive at Short Stay. Do not  shave (including legs and underarms) for at least 48 hours prior to the first CHG shower.  You may shave your face/neck. Please follow these instructions carefully:  1.  Shower with CHG Soap the night before surgery and the  morning of Surgery.  2.  If you choose to wash your hair, wash your hair first as usual with your  normal  shampoo.  3.  After you shampoo, rinse your hair and body thoroughly to remove the  shampoo.                           4.  Use CHG as you would any other liquid soap.  You can apply chg directly  to the skin and wash                       Gently with a scrungie or clean washcloth.  5.  Apply the CHG Soap to your body ONLY FROM THE NECK DOWN.   Do not use on face/ open                           Wound or open sores. Avoid contact with eyes, ears mouth and genitals (private parts).                       Wash face,  Genitals (private parts) with your normal soap.             6.  Wash thoroughly, paying special attention to the area where your surgery  will be performed.  7.  Thoroughly rinse your body with warm water from the neck down.  8.  DO NOT shower/wash with your normal soap after using and rinsing off  the CHG Soap.                9.  Pat yourself dry with a clean towel.            10.  Wear clean pajamas.            11.  Place clean sheets on your bed the night of your first shower and do not  sleep with pets. Day of Surgery : Do not apply any lotions/deodorants the morning of surgery.  Please wear clean clothes to the hospital/surgery center.  FAILURE TO FOLLOW THESE INSTRUCTIONS MAY RESULT IN THE CANCELLATION OF YOUR SURGERY PATIENT SIGNATURE_________________________________  NURSE SIGNATURE__________________________________  ________________________________________________________________________

## 2017-01-10 ENCOUNTER — Encounter (HOSPITAL_COMMUNITY): Payer: Self-pay

## 2017-01-10 ENCOUNTER — Other Ambulatory Visit: Payer: Self-pay

## 2017-01-10 ENCOUNTER — Encounter (HOSPITAL_COMMUNITY)
Admission: RE | Admit: 2017-01-10 | Discharge: 2017-01-10 | Disposition: A | Discharge status: Home / Self Care | Payer: BLUE CROSS/BLUE SHIELD | Source: Ambulatory Visit | Attending: Urology | Admitting: Urology

## 2017-01-10 DIAGNOSIS — R102 Pelvic and perineal pain: Secondary | ICD-10-CM | POA: Diagnosis not present

## 2017-01-10 DIAGNOSIS — Z01810 Encounter for preprocedural cardiovascular examination: Secondary | ICD-10-CM | POA: Diagnosis present

## 2017-01-10 DIAGNOSIS — Z01818 Encounter for other preprocedural examination: Secondary | ICD-10-CM | POA: Insufficient documentation

## 2017-01-10 DIAGNOSIS — I10 Essential (primary) hypertension: Secondary | ICD-10-CM | POA: Diagnosis not present

## 2017-01-10 HISTORY — DX: Unspecified injury of right wrist, hand and finger(s), initial encounter: S69.91XA

## 2017-01-10 LAB — CBC
HCT: 43.7 % (ref 36.0–46.0)
HEMOGLOBIN: 14.2 g/dL (ref 12.0–15.0)
MCH: 32 pg (ref 26.0–34.0)
MCHC: 32.5 g/dL (ref 30.0–36.0)
MCV: 98.4 fL (ref 78.0–100.0)
Platelets: 268 10*3/uL (ref 150–400)
RBC: 4.44 MIL/uL (ref 3.87–5.11)
RDW: 14.2 % (ref 11.5–15.5)
WBC: 5.5 10*3/uL (ref 4.0–10.5)

## 2017-01-10 LAB — BASIC METABOLIC PANEL
Anion gap: 6 (ref 5–15)
BUN: 20 mg/dL (ref 6–20)
CHLORIDE: 110 mmol/L (ref 101–111)
CO2: 24 mmol/L (ref 22–32)
CREATININE: 0.73 mg/dL (ref 0.44–1.00)
Calcium: 9.9 mg/dL (ref 8.9–10.3)
GFR calc non Af Amer: >60 mL/min (ref >60)
Glucose, Bld: 98 mg/dL (ref 65–99)
POTASSIUM: 4.2 mmol/L (ref 3.5–5.1)
SODIUM: 140 mmol/L (ref 135–145)

## 2017-01-13 NOTE — H&P (Signed)
I was consulted by the above provider to assist the patient's urinary incontinence worsening over years. She leaks with coughing sneezing bending and lifting. She has urge incontinence. Both are significant. Sometimes she can have bedwetting. She wears 3 pads per day moderately wet.   She can void every 30-60 minutes and other days every several hours. She gets up 5 or 6 times a night and takes hydrochlorothiazide and reports ankle edema.   She has a poor flow with stopping and starting and does not feel empty.   She was treated for gross hematuria and a urinary tract infection and has had previous lithotripsy.   She denies a history of urinary tract infections otherwise. She has had neck surgery. She has had a hysterectomy. She has alternating constipation and diarrhea. She failed myrbetriq and perhaps a pill prescribed by Dr. Janice Norrie a few years ago   the patient had grade 2cough test after cystoscopy. neck with a negative cough test after cystoscopy. She had a mild suburethral swelling and a mild opinion does not have a diverticulum. She had a minimal cystocele and no rectocele.   ystoscopy within normal limits with a few white flecks in the urine.   the patient has mixed incontinence but she does have significant frequency, quite severe nighttime frequency, intermittent bedwetting and significant flow symptoms.at the end of the visit the patient noted she hasevelmby voiding.or the last year. It is not relieved by voiding.or the last year. It is not relieved by voiding. She has significant pain with intercourse as well.   the patient's urine was sent for culture. She will return with urodynamics. We will proceed accordingly. sent to above provider. evidence of the possibility of interstitial cystitis will also be looked into during the urodynamics.   Today  frequency and incontinence are stable  Urine culture normal   On urodynamics she did not void for 45 min and was catheterized for 275 mL.  initial residual was 0 mL. Bladder capacity was 296 mL limited by fullness and pain her bladder was unstable reaching pressures of 11 cm of water but she did not leak. Initially she rated her lower abdominal pain is a 7 and a 10 at 79 mL she did not leak with Valsalva pressure of 116 cm of water seal during voluntary voiding it took a long time to try to void. She voided 48 mL with maximal flow of 10 mL/second. Maximum voiding pressure of 21 cm of water. The residual was 246 mL. The contractions were not well sustained. She had interrupted flow pattern.   This patient primarily has an overactive bladder with urge incontinence at times bedwetting significant frequency and nocturia. My index of suspicion is high she has interstitial cystitis. She reports stress incontinence but it is very mild not demonstrated on pelvic examination or urodynamics. The role of a hydrodistention was discussed And she would like to proceed. She reported the test mimic real life    The patient understands that they she/he could have interstitial cystitis. We talked about the role of cystoscopy/hydrodistension and instillation in detail. Pros, cons, general surgical and anesthetic risks, and other options including watchful waiting were discussed. Risks were described but not limited to pain, infection, and bleeding. The risk of bladder perforation and management were discussed. The patient understands that it is primarily a diagnostic procedure.      ALLERGIES: diclofenac Latex Exam Gloves MISC    MEDICATIONS: Lisinopril  Cetirizine Hcl  Oxycodone Hcl  Pantoprazole Sodium  GU PSH: Complex cystometrogram, w/ void pressure and urethral pressure profile studies, any technique - 11/26/2016 Complex Uroflow - 11/26/2016 Cystoscopy - 11/11/2016, 06/25/2016 Emg surf Electrd - 11/26/2016 ESWL Hysterectomy Unilat SO Inject For cystogram - 11/26/2016 Intrabd voidng Press - 11/26/2016     PSH Notes: Thoracic Deep  Incision   NON-GU PSH: Cholecystectomy (laparoscopic) Hernia Repair Shoulder Arthroscopy/surgery, Left     GU PMH: Mixed incontinence (Stable) - 11/11/2016, - 09/23/2016, - 06/25/2016 Nocturia - 11/11/2016 Nocturnal Enuresis - 11/11/2016 Urinary Frequency - 11/11/2016 Dorsalgia, Unspec - 09/23/2016, - 06/25/2016, Bilateral, - 06/04/2016, Backache, - 2015 Gross hematuria - 09/23/2016, - 06/25/2016, Gross hematuria, - 2015 Renal calculus - 09/23/2016, - 06/25/2016, Calculus of left kidney, - 2015, Nephrolithiasis, - 2015 Flank Pain - 05/30/2016 Abdominal Pain Unspec, Left flank pain - 2015 Other microscopic hematuria, Microscopic hematuria - 2015 History of urolithiasis, History of renal calculi - 2015 Stress Incontinence, Female stress incontinence - 2015    NON-GU PMH: Diverticulosis, Diverticulosis - 2015 Encounter for general adult medical examination without abnormal findings, Encounter for preventive health examination - 2015 Anxiety, Anxiety - 2015 Personal history of other diseases of the circulatory system, History of cardiac arrhythmia - 2015, History of hypertension, - 2015 Personal history of other mental and behavioral disorders, History of depression - 2015 Seizure disorder, Seizures - 2015    FAMILY HISTORY: cardiac disorder - Runs In Family Death of family member - Runs In Family Diabetes - Runs In Family Hematuria - Runs In Family Prostate Cancer - Runs In Family Strokes - Runs In Family   SOCIAL HISTORY: Marital Status: Married Preferred Language: English; Ethnicity: ; Race: Other Race Current Smoking Status: Patient has never smoked.  <DIV'  Tobacco Use Assessment Completed:  Used Tobacco in last 30 days?      REVIEW OF SYSTEMS:    GU Review Female:  Patient reports leakage of urine and get up at night to urinate. Patient denies have to strain to urinate, frequent urination, being pregnant, burning /pain with urination, hard to postpone urination, stream starts  and stops, and trouble starting your stream.  Gastrointestinal (Upper):  Patient reports indigestion/ heartburn. Patient denies nausea and vomiting.  Gastrointestinal (Lower):  Patient denies diarrhea and constipation.  Constitutional:  Patient denies fever, night sweats, weight loss, and fatigue.  Skin:  Patient denies skin rash/ lesion and itching.  Eyes:  Patient denies blurred vision and double vision.  Ears/ Nose/ Throat:  Patient reports sinus problems. Patient denies sore throat.  Hematologic/Lymphatic:  Patient reports easy bruising. Patient denies swollen glands.  Cardiovascular:  Patient denies leg swelling and chest pains.  Respiratory:  Patient reports cough. Patient denies shortness of breath.  Endocrine:  Patient denies excessive thirst.  Musculoskeletal:  Patient reports back pain. Patient denies joint pain.  Neurological:  Patient reports headaches. Patient denies dizziness.  Psychologic:  Patient reports depression and anxiety.    VITAL SIGNS: None   PAST DATA REVIEWED:   Source Of History:  Patient   PROCEDURES:    Urinalysis  Dipstick Dipstick Cont'd  Color: Yellow Bilirubin: Neg  Appearance: Clear Ketones: Neg  Specific Gravity: 1.025 Blood: Neg  pH: 6.0 Protein: Neg  Glucose: Neg Urobilinogen: 0.2   Nitrites: Neg   Leukocyte Esterase: Neg    ASSESSMENT: None   PLAN:   Schedule  Return Visit/Planned Activity: ASAP - Schedule Surgery   After a thorough review of the management options for the patient's condition the patient  elected to proceed with surgical  therapy as noted above. We have discussed the potential benefits and risks of the procedure, side effects of the proposed treatment, the likelihood of the patient achieving the goals of the procedure, and any potential problems that might occur during the procedure or recuperation. Informed consent has been obtained.

## 2017-01-14 ENCOUNTER — Ambulatory Visit (HOSPITAL_COMMUNITY): Payer: BLUE CROSS/BLUE SHIELD | Admitting: Certified Registered"

## 2017-01-14 ENCOUNTER — Encounter (HOSPITAL_COMMUNITY)
Admission: RE | Disposition: A | Discharge status: Home / Self Care | Payer: Self-pay | Source: Ambulatory Visit | Attending: Urology

## 2017-01-14 ENCOUNTER — Other Ambulatory Visit: Payer: Self-pay

## 2017-01-14 ENCOUNTER — Ambulatory Visit (HOSPITAL_COMMUNITY)
Admission: RE | Admit: 2017-01-14 | Discharge: 2017-01-14 | Disposition: A | Discharge status: Home / Self Care | Payer: BLUE CROSS/BLUE SHIELD | Source: Ambulatory Visit | Attending: Urology | Admitting: Urology

## 2017-01-14 ENCOUNTER — Encounter (HOSPITAL_COMMUNITY): Payer: Self-pay | Admitting: *Deleted

## 2017-01-14 DIAGNOSIS — N3946 Mixed incontinence: Secondary | ICD-10-CM | POA: Insufficient documentation

## 2017-01-14 DIAGNOSIS — Z90710 Acquired absence of both cervix and uterus: Secondary | ICD-10-CM | POA: Insufficient documentation

## 2017-01-14 DIAGNOSIS — I10 Essential (primary) hypertension: Secondary | ICD-10-CM | POA: Diagnosis not present

## 2017-01-14 DIAGNOSIS — Z79899 Other long term (current) drug therapy: Secondary | ICD-10-CM | POA: Diagnosis not present

## 2017-01-14 DIAGNOSIS — N3010 Interstitial cystitis (chronic) without hematuria: Secondary | ICD-10-CM | POA: Insufficient documentation

## 2017-01-14 DIAGNOSIS — R350 Frequency of micturition: Secondary | ICD-10-CM | POA: Diagnosis present

## 2017-01-14 DIAGNOSIS — G40909 Epilepsy, unspecified, not intractable, without status epilepticus: Secondary | ICD-10-CM | POA: Diagnosis not present

## 2017-01-14 HISTORY — PX: CYSTO WITH HYDRODISTENSION: SHX5453

## 2017-01-14 SURGERY — CYSTOSCOPY, WITH BLADDER HYDRODISTENSION
Anesthesia: General | Site: Urethra

## 2017-01-14 MED ORDER — STERILE WATER FOR IRRIGATION IR SOLN
Status: DC | PRN
  Administered 2017-01-14: 3000 mL via INTRAVESICAL

## 2017-01-14 MED ORDER — HYDROCODONE-ACETAMINOPHEN 5-325 MG PO TABS
1.0000 | ORAL_TABLET | Freq: Once | ORAL | Status: AC
  Administered 2017-01-14: 1 via ORAL
  Filled 2017-01-14: qty 1

## 2017-01-14 MED ORDER — FENTANYL CITRATE (PF) 100 MCG/2ML IJ SOLN
INTRAMUSCULAR | Status: DC | PRN
  Administered 2017-01-14 (×2): 25 ug via INTRAVENOUS
  Administered 2017-01-14: 50 ug via INTRAVENOUS

## 2017-01-14 MED ORDER — SCOPOLAMINE 1 MG/3DAYS TD PT72
MEDICATED_PATCH | TRANSDERMAL | Status: AC
  Filled 2017-01-14: qty 1

## 2017-01-14 MED ORDER — LACTATED RINGERS IV SOLN
INTRAVENOUS | Status: DC
  Administered 2017-01-14: 09:00:00 via INTRAVENOUS

## 2017-01-14 MED ORDER — ONDANSETRON HCL 4 MG/2ML IJ SOLN
INTRAMUSCULAR | Status: AC
  Filled 2017-01-14: qty 2

## 2017-01-14 MED ORDER — DEXAMETHASONE SODIUM PHOSPHATE 10 MG/ML IJ SOLN
INTRAMUSCULAR | Status: AC
  Filled 2017-01-14: qty 1

## 2017-01-14 MED ORDER — MEPERIDINE HCL 50 MG/ML IJ SOLN: 6.2500 mg | INTRAMUSCULAR | Status: DC | PRN

## 2017-01-14 MED ORDER — MIDAZOLAM HCL 5 MG/5ML IJ SOLN
INTRAMUSCULAR | Status: DC | PRN
  Administered 2017-01-14: 2 mg via INTRAVENOUS

## 2017-01-14 MED ORDER — ACETAMINOPHEN 325 MG PO TABS: 325.0000 mg | ORAL_TABLET | ORAL | Status: DC | PRN

## 2017-01-14 MED ORDER — ACETAMINOPHEN 160 MG/5ML PO SOLN: 325.0000 mg | ORAL | Status: DC | PRN

## 2017-01-14 MED ORDER — HYDROCODONE-ACETAMINOPHEN 5-325 MG PO TABS: 1.0000 | ORAL_TABLET | Freq: Four times a day (QID) | ORAL | 0 refills | Status: DC | PRN

## 2017-01-14 MED ORDER — PROPOFOL 10 MG/ML IV BOLUS
INTRAVENOUS | Status: DC | PRN
  Administered 2017-01-14: 10 mg via INTRAVENOUS

## 2017-01-14 MED ORDER — FENTANYL CITRATE (PF) 100 MCG/2ML IJ SOLN: 25.0000 ug | INTRAMUSCULAR | Status: DC | PRN

## 2017-01-14 MED ORDER — DEXAMETHASONE SODIUM PHOSPHATE 10 MG/ML IJ SOLN
INTRAMUSCULAR | Status: DC | PRN
  Administered 2017-01-14: 10 mg via INTRAVENOUS

## 2017-01-14 MED ORDER — LIDOCAINE 2% (20 MG/ML) 5 ML SYRINGE
INTRAMUSCULAR | Status: DC | PRN
  Administered 2017-01-14: 100 mg via INTRAVENOUS

## 2017-01-14 MED ORDER — CIPROFLOXACIN HCL 250 MG PO TABS: 250.0000 mg | ORAL_TABLET | Freq: Two times a day (BID) | ORAL | 0 refills | Status: DC

## 2017-01-14 MED ORDER — ONDANSETRON HCL 4 MG/2ML IJ SOLN
INTRAMUSCULAR | Status: DC | PRN
  Administered 2017-01-14: 4 mg via INTRAVENOUS

## 2017-01-14 MED ORDER — CIPROFLOXACIN IN D5W 400 MG/200ML IV SOLN
400.0000 mg | INTRAVENOUS | Status: AC
  Administered 2017-01-14: 400 mg via INTRAVENOUS
  Filled 2017-01-14: qty 200

## 2017-01-14 MED ORDER — ONDANSETRON HCL 4 MG/2ML IJ SOLN: 4.0000 mg | Freq: Once | INTRAMUSCULAR | Status: DC | PRN

## 2017-01-14 MED ORDER — PROPOFOL 10 MG/ML IV BOLUS
INTRAVENOUS | Status: AC
  Filled 2017-01-14: qty 20

## 2017-01-14 MED ORDER — PHENAZOPYRIDINE HCL 200 MG PO TABS
Freq: Once | ORAL | Status: AC
  Administered 2017-01-14: 15 mL via INTRAVESICAL
  Filled 2017-01-14: qty 15

## 2017-01-14 MED ORDER — FENTANYL CITRATE (PF) 100 MCG/2ML IJ SOLN
INTRAMUSCULAR | Status: AC
  Filled 2017-01-14: qty 2

## 2017-01-14 MED ORDER — LIDOCAINE 2% (20 MG/ML) 5 ML SYRINGE
INTRAMUSCULAR | Status: AC
  Filled 2017-01-14: qty 5

## 2017-01-14 MED ORDER — OXYCODONE HCL 5 MG PO TABS: 5.0000 mg | ORAL_TABLET | Freq: Once | ORAL | Status: DC | PRN

## 2017-01-14 MED ORDER — MIDAZOLAM HCL 2 MG/2ML IJ SOLN
INTRAMUSCULAR | Status: AC
  Filled 2017-01-14: qty 2

## 2017-01-14 MED ORDER — OXYCODONE HCL 5 MG/5ML PO SOLN
5.0000 mg | Freq: Once | ORAL | Status: DC | PRN
  Filled 2017-01-14: qty 5

## 2017-01-14 SURGICAL SUPPLY — 13 items
BAG URO CATCHER STRL LF (MISCELLANEOUS) ×2 IMPLANT
CATH ROBINSON RED A/P 16FR (CATHETERS) IMPLANT
CATH SILICONE 16FRX5CC (CATHETERS) ×2 IMPLANT
COVER FOOTSWITCH UNIV (MISCELLANEOUS) IMPLANT
COVER SURGICAL LIGHT HANDLE (MISCELLANEOUS) ×2 IMPLANT
ELECT REM PT RETURN 15FT ADLT (MISCELLANEOUS) IMPLANT
GLOVE BIOGEL M STRL SZ7.5 (GLOVE) ×2 IMPLANT
GOWN STRL REUS W/TWL XL LVL3 (GOWN DISPOSABLE) ×2 IMPLANT
HOLDER FOLEY CATH W/STRAP (MISCELLANEOUS) IMPLANT
NEEDLE HYPO 25X1 1.5 SAFETY (NEEDLE) IMPLANT
PACK CYSTO (CUSTOM PROCEDURE TRAY) ×2 IMPLANT
SYR 20CC LL (SYRINGE) ×2 IMPLANT
TUBING CONNECTING 10 (TUBING) ×2 IMPLANT

## 2017-01-14 NOTE — Anesthesia Procedure Notes (Signed)
Procedure Name: LMA Insertion Date/Time: 01/14/2017 11:25 AM Performed by: Sharlette Dense, CRNA Patient Re-evaluated:Patient Re-evaluated prior to induction Oxygen Delivery Method: Circle system utilized Preoxygenation: Pre-oxygenation with 100% oxygen Induction Type: IV induction Ventilation: Mask ventilation without difficulty LMA: LMA inserted LMA Size: 4.0 Number of attempts: 1 Placement Confirmation: positive ETCO2 and breath sounds checked- equal and bilateral Tube secured with: Tape Dental Injury: Teeth and Oropharynx as per pre-operative assessment

## 2017-01-14 NOTE — Interval H&P Note (Signed)
History and Physical Interval Note:  01/14/2017 10:56 AM  [name]  has presented today for surgery, with the diagnosis of PELVIC PAIN  The various methods of treatment have been discussed with the patient and family. After consideration of risks, benefits and other options for treatment, the patient has consented to  Procedure(s): CYSTOSCOPY/HYDRODISTENSION AND INSTILLATION (N/A) as a surgical intervention .  The patient's history has been reviewed, patient examined, no change in status, stable for surgery.  I have reviewed the patient's chart and labs.  Questions were answered to the patient's satisfaction.     Damarco Keysor A

## 2017-01-14 NOTE — Op Note (Signed)
Preoperative diagnosis: Frequency and pelvic pain Postoperative diagnosis: Interstitial cystitis surgery:  Surgery:: Cystoscopy and bladder hydrodistention and cystoscopy and bladder instillation treatment Surgeon: Dr. Nicki Reaper [name]  The patient has the above diagnosis and consented to the above procedure. 3 French cystoscope was utilized. The bladder mucosa and trigone were normal. There is no stitch all foreign body or carcinoma. hydrodistention to 800 mL  After hydrodistention there will fine petechiae especially at 5 and 7:00 and near the trigone and a little bit throughout the bladder dome.bladder was emptied.   There was no bladder  Bladder was emptied.   There was no bladder injury  As a separate procedure 20 cc of 0.5% Marcaine and 400 mg of Pyridium was instilled with latex free catheter  The patient will be treated for interstitial cystitis

## 2017-01-14 NOTE — Transfer of Care (Signed)
Immediate Anesthesia Transfer of Care Note  Patient: [name]  Procedure(s) Performed: CYSTOSCOPY/HYDRODISTENSION AND INSTILLATION (N/A Urethra)  Patient Location: PACU  Anesthesia Type:General  Level of Consciousness: awake, alert  and oriented  Airway & Oxygen Therapy: Patient Spontanous Breathing and Patient connected to face mask oxygen  Post-op Assessment: Report given to RN and Post -op Vital signs reviewed and stable  Post vital signs: Reviewed and stable  Last Vitals:  Vitals:   01/14/17 0810  BP: (!) 142/84  Pulse: 85  Resp: 18  Temp: 36.7 C  SpO2: 100%    Last Pain:  Vitals:   01/14/17 0842  TempSrc:   PainSc: 6          Complications: No apparent anesthesia complications

## 2017-01-14 NOTE — Discharge Instructions (Signed)
General Anesthesia, Adult, Care After These instructions provide you with information about caring for yourself after your procedure. Your health care provider may also give you more specific instructions. Your treatment has been planned according to current medical practices, but problems sometimes occur. Call your health care provider if you have any problems or questions after your procedure. What can I expect after the procedure? After the procedure, it is common to have:  Vomiting.  A sore throat.  Mental slowness.  It is common to feel:  Nauseous.  Cold or shivery.  Sleepy.  Tired.  Sore or achy, even in parts of your body where you did not have surgery.  Follow these instructions at home: For at least 24 hours after the procedure:  Do not: ? Participate in activities where you could fall or become injured. ? Drive. ? Use heavy machinery. ? Drink alcohol. ? Take sleeping pills or medicines that cause drowsiness. ? Make important decisions or sign legal documents. ? Take care of children on your own.  Rest. Eating and drinking  If you vomit, drink water, juice, or soup when you can drink without vomiting.  Drink enough fluid to keep your urine clear or pale yellow.  Make sure you have little or no nausea before eating solid foods.  Follow the diet recommended by your health care provider. General instructions  Have a responsible adult stay with you until you are awake and alert.  Return to your normal activities as told by your health care provider. Ask your health care provider what activities are safe for you.  Take over-the-counter and prescription medicines only as told by your health care provider.  If you smoke, do not smoke without supervision.  Keep all follow-up visits as told by your health care provider. This is important. Contact a health care provider if:  You continue to have nausea or vomiting at home, and medicines are not helpful.  You  cannot drink fluids or start eating again.  You cannot urinate after 8-12 hours.  You develop a skin rash.  You have fever.  You have increasing redness at the site of your procedure. Get help right away if:  You have difficulty breathing.  You have chest pain.  You have unexpected bleeding.  You feel that you are having a life-threatening or urgent problem. This information is not intended to replace advice given to you by your health care provider. Make sure you discuss any questions you have with your health care provider. Document Released: 04/22/2000 Document Revised: 06/19/2015 Document Reviewed: 12/29/2014 Elsevier Interactive Patient Education  [employer]. I have reviewed discharge instructions in detail with the patient. They will follow-up with me or their physician as scheduled. My nurse will also be calling the patients as per protocol.

## 2017-01-14 NOTE — Anesthesia Preprocedure Evaluation (Signed)
Anesthesia Evaluation  Patient identified by MRN, date of birth, ID band Patient awake    Reviewed: Allergy & Precautions, NPO status , Patient's Chart, lab work & pertinent test results  Airway Mallampati: I       Dental no notable dental hx. (+) Teeth Intact   Pulmonary    Pulmonary exam normal breath sounds clear to auscultation       Cardiovascular hypertension, Pt. on medications Normal cardiovascular exam Rhythm:Regular Rate:Normal     Neuro/Psych    GI/Hepatic negative GI ROS, Neg liver ROS,   Endo/Other  negative endocrine ROS  Renal/GU  Bladder dysfunction      Musculoskeletal   Abdominal Normal abdominal exam  (+)   Peds negative pediatric ROS (+)  Hematology   Anesthesia Other Findings   Reproductive/Obstetrics                             Anesthesia Physical Anesthesia Plan  ASA: II  Anesthesia Plan: General   Post-op Pain Management:    Induction: Intravenous  PONV Risk Score and Plan: 4 or greater and Ondansetron, Dexamethasone, Scopolamine patch - Pre-op and Midazolam  Airway Management Planned: LMA  Additional Equipment:   Intra-op Plan:   Post-operative Plan:   Informed Consent: I have reviewed the patients History and Physical, chart, labs and discussed the procedure including the risks, benefits and alternatives for the proposed anesthesia with the patient or authorized representative who has indicated his/her understanding and acceptance.     Plan Discussed with: CRNA and Surgeon  Anesthesia Plan Comments:         Anesthesia Quick Evaluation

## 2017-01-14 NOTE — Anesthesia Postprocedure Evaluation (Signed)
Anesthesia Post Note  Patient: [name]  Procedure(s) Performed: CYSTOSCOPY/HYDRODISTENSION AND INSTILLATION (N/A Urethra)     Patient location during evaluation: PACU Anesthesia Type: General Level of consciousness: awake Pain management: pain level controlled Vital Signs Assessment: post-procedure vital signs reviewed and stable Cardiovascular status: stable Postop Assessment: no apparent nausea or vomiting Anesthetic complications: no    Last Vitals:  Vitals:   01/14/17 1215 01/14/17 1224  BP:  122/73  Pulse:  77  Resp:  15  Temp: (!) 36.3 C (!) 36.3 C  SpO2:  100%    Last Pain:  Vitals:   01/14/17 1200  TempSrc:   PainSc: Asleep   Pain Goal:                 [name],[name] Marijane Trower

## 2017-01-15 ENCOUNTER — Encounter (HOSPITAL_COMMUNITY): Payer: Self-pay | Admitting: Urology

## 2017-03-14 ENCOUNTER — Ambulatory Visit (HOSPITAL_COMMUNITY)
Admission: EM | Admit: 2017-03-14 | Discharge: 2017-03-14 | Disposition: A | Discharge status: Home / Self Care | Payer: BLUE CROSS/BLUE SHIELD | Attending: Family Medicine | Admitting: Family Medicine

## 2017-03-14 ENCOUNTER — Encounter (HOSPITAL_COMMUNITY): Payer: Self-pay | Admitting: Family Medicine

## 2017-03-14 DIAGNOSIS — W19XXXA Unspecified fall, initial encounter: Secondary | ICD-10-CM | POA: Diagnosis not present

## 2017-03-14 DIAGNOSIS — M47812 Spondylosis without myelopathy or radiculopathy, cervical region: Secondary | ICD-10-CM | POA: Diagnosis not present

## 2017-03-14 DIAGNOSIS — R42 Dizziness and giddiness: Secondary | ICD-10-CM | POA: Diagnosis not present

## 2017-03-14 MED ORDER — PREDNISONE 20 MG PO TABS: ORAL_TABLET | ORAL | 0 refills | Status: DC

## 2017-03-14 NOTE — ED Triage Notes (Signed)
PT C/O: reports she fell 5 days ago and landed on her buttocks onto vinyl flooring  Reports since then, has been having pain on neck, feeling dizzy  DENIES: head inj/LOC  TAKING MEDS: Ibuprofen   A&O x4... NAD... Ambulatory

## 2017-03-14 NOTE — Discharge Instructions (Signed)
Follow up with your doctor if symptoms persist.  °
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
## 2017-03-31 ENCOUNTER — Ambulatory Visit
Admission: RE | Admit: 2017-03-31 | Discharge: 2017-03-31 | Disposition: A | Discharge status: Home / Self Care | Payer: BLUE CROSS/BLUE SHIELD | Source: Ambulatory Visit | Attending: Neurosurgery | Admitting: Neurosurgery

## 2017-03-31 ENCOUNTER — Other Ambulatory Visit: Payer: Self-pay | Admitting: Neurosurgery

## 2017-03-31 DIAGNOSIS — S12490S Other displaced fracture of fifth cervical vertebra, sequela: Secondary | ICD-10-CM

## 2017-04-03 ENCOUNTER — Other Ambulatory Visit: Payer: Self-pay | Admitting: Neurosurgery

## 2017-04-03 DIAGNOSIS — S12490S Other displaced fracture of fifth cervical vertebra, sequela: Secondary | ICD-10-CM

## 2017-04-10 ENCOUNTER — Other Ambulatory Visit: Payer: Self-pay | Admitting: Neurosurgery

## 2017-04-10 DIAGNOSIS — S12490S Other displaced fracture of fifth cervical vertebra, sequela: Secondary | ICD-10-CM

## 2017-04-15 ENCOUNTER — Ambulatory Visit
Admission: RE | Admit: 2017-04-15 | Discharge: 2017-04-15 | Disposition: A | Discharge status: Home / Self Care | Payer: BLUE CROSS/BLUE SHIELD | Source: Ambulatory Visit | Attending: Neurosurgery | Admitting: Neurosurgery

## 2017-04-15 DIAGNOSIS — S12490S Other displaced fracture of fifth cervical vertebra, sequela: Secondary | ICD-10-CM

## 2017-04-18 ENCOUNTER — Other Ambulatory Visit: Payer: Self-pay

## 2017-04-21 ENCOUNTER — Encounter (HOSPITAL_BASED_OUTPATIENT_CLINIC_OR_DEPARTMENT_OTHER): Payer: Self-pay | Admitting: *Deleted

## 2017-04-21 ENCOUNTER — Emergency Department (HOSPITAL_BASED_OUTPATIENT_CLINIC_OR_DEPARTMENT_OTHER): Payer: BLUE CROSS/BLUE SHIELD

## 2017-04-21 ENCOUNTER — Other Ambulatory Visit: Payer: Self-pay

## 2017-04-21 ENCOUNTER — Emergency Department (HOSPITAL_BASED_OUTPATIENT_CLINIC_OR_DEPARTMENT_OTHER)
Admission: EM | Admit: 2017-04-21 | Discharge: 2017-04-21 | Disposition: A | Discharge status: Home / Self Care | Payer: BLUE CROSS/BLUE SHIELD | Attending: Emergency Medicine | Admitting: Emergency Medicine

## 2017-04-21 DIAGNOSIS — Z79899 Other long term (current) drug therapy: Secondary | ICD-10-CM | POA: Insufficient documentation

## 2017-04-21 DIAGNOSIS — Z91040 Latex allergy status: Secondary | ICD-10-CM | POA: Diagnosis not present

## 2017-04-21 DIAGNOSIS — R05 Cough: Secondary | ICD-10-CM

## 2017-04-21 DIAGNOSIS — J40 Bronchitis, not specified as acute or chronic: Secondary | ICD-10-CM | POA: Diagnosis not present

## 2017-04-21 DIAGNOSIS — R059 Cough, unspecified: Secondary | ICD-10-CM

## 2017-04-21 DIAGNOSIS — I10 Essential (primary) hypertension: Secondary | ICD-10-CM | POA: Insufficient documentation

## 2017-04-21 DIAGNOSIS — Z96612 Presence of left artificial shoulder joint: Secondary | ICD-10-CM | POA: Diagnosis not present

## 2017-04-21 LAB — RAPID STREP SCREEN (MED CTR MEBANE ONLY): STREPTOCOCCUS, GROUP A SCREEN (DIRECT): NEGATIVE

## 2017-04-21 MED ORDER — GUAIFENESIN 100 MG/5ML PO LIQD: 200.0000 mg | ORAL | 0 refills | Status: DC | PRN

## 2017-04-21 MED ORDER — GUAIFENESIN-CODEINE 100-10 MG/5ML PO SOLN: 10.0000 mL | Freq: Once | ORAL | Status: DC

## 2017-04-21 MED ORDER — PREDNISONE 50 MG PO TABS
60.0000 mg | ORAL_TABLET | Freq: Once | ORAL | Status: AC
  Administered 2017-04-21: 21:00:00 60 mg via ORAL
  Filled 2017-04-21: qty 1

## 2017-04-21 MED ORDER — ACETAMINOPHEN 325 MG PO TABS
650.0000 mg | ORAL_TABLET | Freq: Once | ORAL | Status: AC | PRN
  Administered 2017-04-21: 650 mg via ORAL
  Filled 2017-04-21: qty 2

## 2017-04-21 MED ORDER — IPRATROPIUM-ALBUTEROL 0.5-2.5 (3) MG/3ML IN SOLN
3.0000 mL | Freq: Once | RESPIRATORY_TRACT | Status: AC
  Administered 2017-04-21: 3 mL via RESPIRATORY_TRACT
  Filled 2017-04-21: qty 3

## 2017-04-21 MED ORDER — PREDNISONE 50 MG PO TABS: 50.0000 mg | ORAL_TABLET | Freq: Every day | ORAL | 0 refills | Status: AC

## 2017-04-21 MED ORDER — ALBUTEROL SULFATE HFA 108 (90 BASE) MCG/ACT IN AERS: 1.0000 | INHALATION_SPRAY | Freq: Four times a day (QID) | RESPIRATORY_TRACT | 0 refills | Status: DC | PRN

## 2017-04-21 MED ORDER — GUAIFENESIN 100 MG/5ML PO SOLN
10.0000 mL | Freq: Once | ORAL | Status: AC
  Administered 2017-04-21: 200 mg via ORAL
  Filled 2017-04-21: qty 10
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
## 2017-04-21 NOTE — ED Triage Notes (Signed)
Cough, sore throat, headache, fever since yesterday. States 3 weeks ago she had the flu.

## 2017-04-21 NOTE — Discharge Instructions (Signed)
Please use your albuterol inhaler every 4 hours for the next 24 hours and then as needed.  Please complete 5-day prednisone course as prescribed.  You may use guaifenesin cough syrup every 4-6 hours as needed.  Please follow-up with your primary care doctor.  If you have persistent fevers, difficulty breathing, persistent wheezing, worsening cough or any other new or concerning symptoms please return to the ED for reevaluation.

## 2017-04-21 NOTE — ED Notes (Signed)
ED Provider at bedside. 

## 2017-04-21 NOTE — Progress Notes (Signed)
Patient states she has a history of asthma starting six years ago.  Patient states that she had breathing problems long before being diagnosed with asthma.

## 2017-04-24 LAB — CULTURE, GROUP A STREP (THRC)

## 2017-05-26 ENCOUNTER — Emergency Department (HOSPITAL_BASED_OUTPATIENT_CLINIC_OR_DEPARTMENT_OTHER): Payer: BLUE CROSS/BLUE SHIELD

## 2017-05-26 ENCOUNTER — Encounter (HOSPITAL_BASED_OUTPATIENT_CLINIC_OR_DEPARTMENT_OTHER): Payer: Self-pay

## 2017-05-26 ENCOUNTER — Emergency Department (HOSPITAL_BASED_OUTPATIENT_CLINIC_OR_DEPARTMENT_OTHER)
Admission: EM | Admit: 2017-05-26 | Discharge: 2017-05-26 | Disposition: A | Discharge status: Home / Self Care | Payer: BLUE CROSS/BLUE SHIELD | Attending: Emergency Medicine | Admitting: Emergency Medicine

## 2017-05-26 ENCOUNTER — Other Ambulatory Visit: Payer: Self-pay

## 2017-05-26 DIAGNOSIS — Z96611 Presence of right artificial shoulder joint: Secondary | ICD-10-CM | POA: Insufficient documentation

## 2017-05-26 DIAGNOSIS — Y939 Activity, unspecified: Secondary | ICD-10-CM | POA: Diagnosis not present

## 2017-05-26 DIAGNOSIS — Y999 Unspecified external cause status: Secondary | ICD-10-CM | POA: Insufficient documentation

## 2017-05-26 DIAGNOSIS — Y92410 Unspecified street and highway as the place of occurrence of the external cause: Secondary | ICD-10-CM | POA: Diagnosis not present

## 2017-05-26 DIAGNOSIS — M7918 Myalgia, other site: Secondary | ICD-10-CM

## 2017-05-26 DIAGNOSIS — Z91040 Latex allergy status: Secondary | ICD-10-CM | POA: Diagnosis not present

## 2017-05-26 DIAGNOSIS — I10 Essential (primary) hypertension: Secondary | ICD-10-CM | POA: Diagnosis not present

## 2017-05-26 MED ORDER — METHOCARBAMOL 500 MG PO TABS: 1000.0000 mg | ORAL_TABLET | Freq: Four times a day (QID) | ORAL | 0 refills | Status: DC

## 2017-05-26 MED ORDER — ACETAMINOPHEN 325 MG PO TABS
650.0000 mg | ORAL_TABLET | Freq: Once | ORAL | Status: AC
  Administered 2017-05-26: 650 mg via ORAL
  Filled 2017-05-26: qty 2

## 2017-05-26 NOTE — ED Notes (Signed)
Pt verbalizes understanding of d/c instructions and denies any further needs at this time. 
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
## 2017-05-26 NOTE — ED Notes (Signed)
C/o neck pain also
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
## 2017-09-10 ENCOUNTER — Other Ambulatory Visit: Payer: Self-pay

## 2017-09-10 ENCOUNTER — Encounter (HOSPITAL_COMMUNITY): Payer: Self-pay | Admitting: Emergency Medicine

## 2017-09-10 ENCOUNTER — Ambulatory Visit (HOSPITAL_COMMUNITY)
Admission: EM | Admit: 2017-09-10 | Discharge: 2017-09-10 | Disposition: A | Discharge status: Home / Self Care | Payer: BLUE CROSS/BLUE SHIELD | Attending: Family Medicine | Admitting: Family Medicine

## 2017-09-10 DIAGNOSIS — J4521 Mild intermittent asthma with (acute) exacerbation: Secondary | ICD-10-CM

## 2017-09-10 MED ORDER — IPRATROPIUM-ALBUTEROL 0.5-2.5 (3) MG/3ML IN SOLN
3.0000 mL | Freq: Once | RESPIRATORY_TRACT | Status: AC
  Administered 2017-09-10: 3 mL via RESPIRATORY_TRACT

## 2017-09-10 MED ORDER — ALBUTEROL SULFATE HFA 108 (90 BASE) MCG/ACT IN AERS: 1.0000 | INHALATION_SPRAY | Freq: Four times a day (QID) | RESPIRATORY_TRACT | 0 refills | Status: DC | PRN

## 2017-09-10 MED ORDER — IPRATROPIUM-ALBUTEROL 0.5-2.5 (3) MG/3ML IN SOLN
RESPIRATORY_TRACT | Status: AC
  Filled 2017-09-10: qty 3

## 2017-09-10 MED ORDER — PREDNISONE 50 MG PO TABS: ORAL_TABLET | ORAL | 0 refills | Status: DC

## 2017-09-10 MED ORDER — ALBUTEROL SULFATE (2.5 MG/3ML) 0.083% IN NEBU: 2.5000 mg | INHALATION_SOLUTION | Freq: Four times a day (QID) | RESPIRATORY_TRACT | 12 refills | Status: DC | PRN

## 2017-09-10 NOTE — Discharge Instructions (Signed)
It was nice meeting you!!  We will treat you for an asthma exacerbation. Short burst of prednisone and give you another albuterol inhaler.  Follow up as needed.

## 2017-09-10 NOTE — ED Triage Notes (Signed)
Pt has a history of asthma.  Pt is out of her inhalers and her PCP has moved.  Pt is here for continued problems with SOB.    She states she has mold in her apartment and her landlord wont fix the problem
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
## 2017-10-29 MED FILL — BUPIVACAINE 0.5% VIAL: 0.5 | 30 days supply | Qty: 450 | Fill #0

## 2017-10-29 MED FILL — BD NEEDLES 18GX1.5": 18G X 1-1/2 | 30 days supply | Qty: 30 | Fill #0

## 2017-10-29 MED FILL — BD NEEDLES 18GX1.5: 18G X 1-1/2 | 30 days supply | Qty: 30 | Fill #0

## 2017-10-29 MED FILL — HEPARIN SOD 10,000 UNIT/ML: 10000 | 30 days supply | Qty: 120 | Fill #0

## 2017-10-29 MED FILL — MONOJECT DISP SYRINGE 20 ML: 20 ML | 30 days supply | Qty: 30 | Fill #0

## 2017-11-28 MED FILL — BD NEEDLES 18GX1.5: 18G X 1-1/2 | 30 days supply | Qty: 30 | Fill #1

## 2017-11-28 MED FILL — BD NEEDLES 18GX1.5": 18G X 1-1/2 | 30 days supply | Qty: 30 | Fill #1

## 2017-11-28 MED FILL — BUPIVACAINE 0.5% VIAL: 0.5 | 30 days supply | Qty: 450 | Fill #1

## 2017-11-28 MED FILL — MONOJECT DISP SYRINGE 20 ML: 20 ML | 30 days supply | Qty: 30 | Fill #1

## 2017-11-28 MED FILL — HEPARIN SOD 10,000 UNIT/ML: 10000 | 30 days supply | Qty: 120 | Fill #1

## 2017-12-08 ENCOUNTER — Encounter (HOSPITAL_BASED_OUTPATIENT_CLINIC_OR_DEPARTMENT_OTHER): Payer: Self-pay | Admitting: Emergency Medicine

## 2017-12-08 ENCOUNTER — Other Ambulatory Visit: Payer: Self-pay

## 2017-12-08 ENCOUNTER — Emergency Department (HOSPITAL_BASED_OUTPATIENT_CLINIC_OR_DEPARTMENT_OTHER): Payer: BLUE CROSS/BLUE SHIELD

## 2017-12-08 ENCOUNTER — Emergency Department (HOSPITAL_BASED_OUTPATIENT_CLINIC_OR_DEPARTMENT_OTHER)
Admission: EM | Admit: 2017-12-08 | Discharge: 2017-12-08 | Disposition: A | Discharge status: Home / Self Care | Payer: BLUE CROSS/BLUE SHIELD | Attending: Emergency Medicine | Admitting: Emergency Medicine

## 2017-12-08 DIAGNOSIS — I10 Essential (primary) hypertension: Secondary | ICD-10-CM | POA: Insufficient documentation

## 2017-12-08 DIAGNOSIS — Z79899 Other long term (current) drug therapy: Secondary | ICD-10-CM | POA: Insufficient documentation

## 2017-12-08 DIAGNOSIS — K6389 Other specified diseases of intestine: Secondary | ICD-10-CM

## 2017-12-08 DIAGNOSIS — R319 Hematuria, unspecified: Secondary | ICD-10-CM | POA: Diagnosis not present

## 2017-12-08 DIAGNOSIS — Q438 Other specified congenital malformations of intestine: Secondary | ICD-10-CM | POA: Insufficient documentation

## 2017-12-08 DIAGNOSIS — R1012 Left upper quadrant pain: Secondary | ICD-10-CM | POA: Insufficient documentation

## 2017-12-08 DIAGNOSIS — K529 Noninfective gastroenteritis and colitis, unspecified: Secondary | ICD-10-CM

## 2017-12-08 DIAGNOSIS — R1032 Left lower quadrant pain: Secondary | ICD-10-CM | POA: Diagnosis present

## 2017-12-08 LAB — CBC WITH DIFFERENTIAL/PLATELET
ABS IMMATURE GRANULOCYTES: 0.03 10*3/uL (ref 0.00–0.07)
BASOS ABS: 0 10*3/uL (ref 0.0–0.1)
Basophils Relative: 1 %
Eosinophils Absolute: 0.1 10*3/uL (ref 0.0–0.5)
Eosinophils Relative: 1 %
HCT: 45.6 % (ref 36.0–46.0)
Hemoglobin: 14.8 g/dL (ref 12.0–15.0)
Immature Granulocytes: 0 %
Lymphocytes Relative: 25 %
Lymphs Abs: 2.1 10*3/uL (ref 0.7–4.0)
MCH: 31.4 pg (ref 26.0–34.0)
MCHC: 32.5 g/dL (ref 30.0–36.0)
MCV: 96.6 fL (ref 80.0–100.0)
MONO ABS: 0.8 10*3/uL (ref 0.1–1.0)
Monocytes Relative: 9 %
NEUTROS ABS: 5.2 10*3/uL (ref 1.7–7.7)
Neutrophils Relative %: 64 %
PLATELETS: 278 10*3/uL (ref 150–400)
RBC: 4.72 MIL/uL (ref 3.87–5.11)
RDW: 13.1 % (ref 11.5–15.5)
WBC: 8.1 10*3/uL (ref 4.0–10.5)
nRBC: 0 % (ref 0.0–0.2)

## 2017-12-08 LAB — COMPREHENSIVE METABOLIC PANEL
ALT: 141 U/L — AB (ref 0–44)
ANION GAP: 10 (ref 5–15)
AST: 183 U/L — ABNORMAL HIGH (ref 15–41)
Albumin: 4.4 g/dL (ref 3.5–5.0)
Alkaline Phosphatase: 99 U/L (ref 38–126)
BUN: 13 mg/dL (ref 6–20)
CHLORIDE: 99 mmol/L (ref 98–111)
CO2: 29 mmol/L (ref 22–32)
Calcium: 9.6 mg/dL (ref 8.9–10.3)
Creatinine, Ser: 0.81 mg/dL (ref 0.44–1.00)
GFR calc non Af Amer: >60 mL/min (ref >60)
Glucose, Bld: 100 mg/dL — ABNORMAL HIGH (ref 70–99)
Potassium: 3 mmol/L — ABNORMAL LOW (ref 3.5–5.1)
SODIUM: 138 mmol/L (ref 135–145)
Total Bilirubin: 1.7 mg/dL — ABNORMAL HIGH (ref 0.3–1.2)
Total Protein: 7.8 g/dL (ref 6.5–8.1)

## 2017-12-08 LAB — URINALYSIS, ROUTINE W REFLEX MICROSCOPIC
Bilirubin Urine: NEGATIVE
Glucose, UA: NEGATIVE mg/dL
Ketones, ur: NEGATIVE mg/dL
LEUKOCYTES UA: NEGATIVE
NITRITE: NEGATIVE
PROTEIN: NEGATIVE mg/dL
Specific Gravity, Urine: <1.005 — ABNORMAL LOW (ref 1.005–1.030)
pH: 7 (ref 5.0–8.0)

## 2017-12-08 LAB — URINALYSIS, MICROSCOPIC (REFLEX)

## 2017-12-08 LAB — LIPASE, BLOOD: Lipase: 35 U/L (ref 11–51)

## 2017-12-08 MED ORDER — POTASSIUM CHLORIDE CRYS ER 20 MEQ PO TBCR
40.0000 meq | EXTENDED_RELEASE_TABLET | Freq: Once | ORAL | Status: AC
  Administered 2017-12-08: 40 meq via ORAL
  Filled 2017-12-08: qty 2

## 2017-12-08 MED ORDER — SODIUM CHLORIDE 0.9 % IV BOLUS
1000.0000 mL | Freq: Once | INTRAVENOUS | Status: AC
  Administered 2017-12-08: 1000 mL via INTRAVENOUS

## 2017-12-08 MED ORDER — HYDROMORPHONE HCL 1 MG/ML IJ SOLN
1.0000 mg | Freq: Once | INTRAMUSCULAR | Status: AC
  Administered 2017-12-08: 1 mg via INTRAVENOUS
  Filled 2017-12-08: qty 1

## 2017-12-08 MED ORDER — ONDANSETRON HCL 4 MG/2ML IJ SOLN
4.0000 mg | Freq: Once | INTRAMUSCULAR | Status: AC
  Administered 2017-12-08: 4 mg via INTRAVENOUS
  Filled 2017-12-08: qty 2

## 2017-12-08 MED ORDER — OXYCODONE-ACETAMINOPHEN 5-325 MG PO TABS
2.0000 | ORAL_TABLET | Freq: Once | ORAL | Status: DC
  Filled 2017-12-08: qty 2

## 2017-12-08 MED ORDER — FAMOTIDINE 20 MG PO TABS: 20.0000 mg | ORAL_TABLET | Freq: Two times a day (BID) | ORAL | 0 refills | Status: DC

## 2017-12-08 MED ORDER — PREDNISONE 50 MG PO TABS
60.0000 mg | ORAL_TABLET | Freq: Once | ORAL | Status: AC
  Administered 2017-12-08: 60 mg via ORAL
  Filled 2017-12-08: qty 1

## 2017-12-08 MED ORDER — DIPHENHYDRAMINE HCL 50 MG/ML IJ SOLN: 25.0000 mg | Freq: Once | INTRAMUSCULAR | Status: DC

## 2017-12-08 MED ORDER — DIPHENHYDRAMINE HCL 25 MG PO CAPS
50.0000 mg | ORAL_CAPSULE | Freq: Once | ORAL | Status: AC
  Administered 2017-12-08: 50 mg via ORAL
  Filled 2017-12-08: qty 2

## 2017-12-08 MED ORDER — DIPHENHYDRAMINE HCL 25 MG PO TABS: 25.0000 mg | ORAL_TABLET | Freq: Four times a day (QID) | ORAL | 0 refills | Status: DC

## 2017-12-08 MED ORDER — MORPHINE SULFATE (PF) 4 MG/ML IV SOLN
4.0000 mg | Freq: Once | INTRAVENOUS | Status: AC
  Administered 2017-12-08: 4 mg via INTRAVENOUS
  Filled 2017-12-08: qty 1

## 2017-12-08 MED ORDER — ONDANSETRON 4 MG PO TBDP: 4.0000 mg | ORAL_TABLET | Freq: Three times a day (TID) | ORAL | 0 refills | Status: DC | PRN

## 2017-12-08 MED ORDER — PREDNISONE 10 MG (21) PO TBPK: ORAL_TABLET | ORAL | 0 refills | Status: DC

## 2017-12-08 MED ORDER — DICYCLOMINE HCL 20 MG PO TABS: 20.0000 mg | ORAL_TABLET | Freq: Two times a day (BID) | ORAL | 0 refills | Status: DC

## 2017-12-08 NOTE — Discharge Instructions (Addendum)
There is an area of inflammation in the abdomen.  This type of inflammation typically resolves on its own.  Zofran: May use Zofran, as needed for nausea/vomiting. Prednisone: This medication is used to reduce inflammation.  Take it until finished. Bentyl: This medication is used to reduce abdominal discomfort. Follow-up with your primary care provider on this matter.  Allergic Reaction Instructions:  Benadryl: Take 25 mg of Benadryl every 6 hours for the next 24 hours.  Use caution as Benadryl can make you drowsy. Pepcid: Take the Pepcid, as prescribed, over the next 3 days.  Follow-up with your primary care provider on this matter.  Allergy testing with an allergist may be warranted.  Return to the ED for worsening symptoms, shortness of breath, chest pain, palpitations, persistent vomiting, facial or throat swelling, or any other major concerns.

## 2017-12-08 NOTE — ED Triage Notes (Signed)
Pt c/o LUQ and LLQ pain since Thurs pm; + nausea; was seen at PCP today and referred here
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
## 2017-12-08 NOTE — ED Notes (Signed)
Pt/family verbalized understanding of discharge instructions.   

## 2017-12-08 NOTE — ED Notes (Signed)
Pt vomited prior to Dilaudid injection; will request additional nausea med

## 2017-12-10 LAB — URINE CULTURE

## 2017-12-11 ENCOUNTER — Telehealth: Payer: Self-pay | Admitting: *Deleted

## 2017-12-11 NOTE — Telephone Encounter (Signed)
Post ED Visit - Positive Culture Follow-up  Culture report reviewed by antimicrobial stewardship pharmacist:  []  Elenor Quinones, Pharm.D. []  Heide Guile, Pharm.D., BCPS AQ-ID []  Parks Neptune, Pharm.D., BCPS []  Alycia Rossetti, Pharm.D., BCPS []  [place], Pharm.D., BCPS, AAHIVP []  Legrand Como, Pharm.D., BCPS, AAHIVP []  Salome Arnt, PharmD, BCPS []  Johnnette Gourd, PharmD, BCPS []  Hughes Better, PharmD, BCPS []  Leeroy Cha, PharmD  Positive urine culture, reviewed by Benedetto Goad, PA-C Concern for renal colic and no further patient follow-up is required at this time.  Harlon Flor Talley 12/11/2017, 10:14 AM

## 2017-12-25 ENCOUNTER — Emergency Department (HOSPITAL_BASED_OUTPATIENT_CLINIC_OR_DEPARTMENT_OTHER)
Admission: EM | Admit: 2017-12-25 | Discharge: 2017-12-25 | Disposition: A | Discharge status: Home / Self Care | Payer: BLUE CROSS/BLUE SHIELD | Attending: Emergency Medicine | Admitting: Emergency Medicine

## 2017-12-25 ENCOUNTER — Other Ambulatory Visit: Payer: Self-pay

## 2017-12-25 ENCOUNTER — Encounter (HOSPITAL_BASED_OUTPATIENT_CLINIC_OR_DEPARTMENT_OTHER): Payer: Self-pay | Admitting: *Deleted

## 2017-12-25 ENCOUNTER — Emergency Department (HOSPITAL_BASED_OUTPATIENT_CLINIC_OR_DEPARTMENT_OTHER): Payer: BLUE CROSS/BLUE SHIELD

## 2017-12-25 DIAGNOSIS — S01112A Laceration without foreign body of left eyelid and periocular area, initial encounter: Secondary | ICD-10-CM

## 2017-12-25 DIAGNOSIS — Y999 Unspecified external cause status: Secondary | ICD-10-CM | POA: Insufficient documentation

## 2017-12-25 DIAGNOSIS — Y9389 Activity, other specified: Secondary | ICD-10-CM | POA: Diagnosis not present

## 2017-12-25 DIAGNOSIS — Z91040 Latex allergy status: Secondary | ICD-10-CM | POA: Insufficient documentation

## 2017-12-25 DIAGNOSIS — S0990XA Unspecified injury of head, initial encounter: Secondary | ICD-10-CM | POA: Diagnosis not present

## 2017-12-25 DIAGNOSIS — I10 Essential (primary) hypertension: Secondary | ICD-10-CM | POA: Insufficient documentation

## 2017-12-25 DIAGNOSIS — Y92810 Car as the place of occurrence of the external cause: Secondary | ICD-10-CM | POA: Diagnosis not present

## 2017-12-25 DIAGNOSIS — Z23 Encounter for immunization: Secondary | ICD-10-CM | POA: Insufficient documentation

## 2017-12-25 DIAGNOSIS — Z79899 Other long term (current) drug therapy: Secondary | ICD-10-CM | POA: Insufficient documentation

## 2017-12-25 MED ORDER — ACETAMINOPHEN 325 MG PO TABS
650.0000 mg | ORAL_TABLET | Freq: Once | ORAL | Status: AC
  Administered 2017-12-25: 650 mg via ORAL
  Filled 2017-12-25: qty 2

## 2017-12-25 MED ORDER — FLUORESCEIN SODIUM 1 MG OP STRP
1.0000 | ORAL_STRIP | Freq: Once | OPHTHALMIC | Status: AC
  Administered 2017-12-25: 1 via OPHTHALMIC
  Filled 2017-12-25: qty 1

## 2017-12-25 MED ORDER — TETRACAINE HCL 0.5 % OP SOLN
2.0000 [drp] | Freq: Once | OPHTHALMIC | Status: AC
  Administered 2017-12-25: 2 [drp] via OPHTHALMIC
  Filled 2017-12-25: qty 4

## 2017-12-25 MED ORDER — ONDANSETRON 4 MG PO TBDP
4.0000 mg | ORAL_TABLET | Freq: Once | ORAL | Status: AC
  Administered 2017-12-25: 4 mg via ORAL
  Filled 2017-12-25: qty 1

## 2017-12-25 MED ORDER — TETANUS-DIPHTH-ACELL PERTUSSIS 5-2.5-18.5 LF-MCG/0.5 IM SUSP
0.5000 mL | Freq: Once | INTRAMUSCULAR | Status: AC
  Administered 2017-12-25: 0.5 mL via INTRAMUSCULAR
  Filled 2017-12-25: qty 0.5

## 2017-12-25 MED ORDER — LIDOCAINE HCL (PF) 1 % IJ SOLN
5.0000 mL | Freq: Once | INTRAMUSCULAR | Status: AC
  Administered 2017-12-25: 5 mL
  Filled 2017-12-25: qty 5
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
## 2018-03-02 DIAGNOSIS — K200 Eosinophilic esophagitis: Secondary | ICD-10-CM | POA: Diagnosis not present

## 2018-03-04 DIAGNOSIS — R0602 Shortness of breath: Secondary | ICD-10-CM | POA: Diagnosis not present

## 2018-03-17 DIAGNOSIS — J018 Other acute sinusitis: Secondary | ICD-10-CM | POA: Diagnosis not present

## 2018-03-17 DIAGNOSIS — D539 Nutritional anemia, unspecified: Secondary | ICD-10-CM | POA: Diagnosis not present

## 2018-03-17 DIAGNOSIS — Z79899 Other long term (current) drug therapy: Secondary | ICD-10-CM | POA: Diagnosis not present

## 2018-03-17 DIAGNOSIS — J029 Acute pharyngitis, unspecified: Secondary | ICD-10-CM | POA: Diagnosis not present

## 2018-03-17 DIAGNOSIS — R8290 Unspecified abnormal findings in urine: Secondary | ICD-10-CM | POA: Diagnosis not present

## 2018-03-17 DIAGNOSIS — R42 Dizziness and giddiness: Secondary | ICD-10-CM | POA: Diagnosis not present

## 2018-03-17 DIAGNOSIS — I10 Essential (primary) hypertension: Secondary | ICD-10-CM | POA: Diagnosis not present

## 2018-03-17 DIAGNOSIS — R509 Fever, unspecified: Secondary | ICD-10-CM | POA: Diagnosis not present

## 2018-04-13 DIAGNOSIS — I10 Essential (primary) hypertension: Secondary | ICD-10-CM | POA: Diagnosis not present

## 2018-04-13 DIAGNOSIS — F329 Major depressive disorder, single episode, unspecified: Secondary | ICD-10-CM | POA: Diagnosis not present

## 2018-04-13 DIAGNOSIS — Z79899 Other long term (current) drug therapy: Secondary | ICD-10-CM | POA: Diagnosis not present

## 2018-04-13 DIAGNOSIS — F419 Anxiety disorder, unspecified: Secondary | ICD-10-CM | POA: Diagnosis not present

## 2018-04-13 DIAGNOSIS — F4310 Post-traumatic stress disorder, unspecified: Secondary | ICD-10-CM | POA: Diagnosis not present

## 2018-04-20 DIAGNOSIS — F339 Major depressive disorder, recurrent, unspecified: Secondary | ICD-10-CM | POA: Diagnosis not present

## 2018-05-04 DIAGNOSIS — F5102 Adjustment insomnia: Secondary | ICD-10-CM | POA: Diagnosis not present

## 2018-05-07 DIAGNOSIS — Z79899 Other long term (current) drug therapy: Secondary | ICD-10-CM | POA: Diagnosis not present

## 2018-05-07 DIAGNOSIS — I10 Essential (primary) hypertension: Secondary | ICD-10-CM | POA: Diagnosis not present

## 2018-05-07 DIAGNOSIS — R5383 Other fatigue: Secondary | ICD-10-CM | POA: Diagnosis not present

## 2018-05-07 DIAGNOSIS — E559 Vitamin D deficiency, unspecified: Secondary | ICD-10-CM | POA: Diagnosis not present

## 2018-05-07 DIAGNOSIS — L0232 Furuncle of buttock: Secondary | ICD-10-CM | POA: Diagnosis not present

## 2018-05-07 DIAGNOSIS — R3129 Other microscopic hematuria: Secondary | ICD-10-CM | POA: Diagnosis not present

## 2018-05-09 DIAGNOSIS — M25561 Pain in right knee: Secondary | ICD-10-CM | POA: Diagnosis not present

## 2018-05-13 DIAGNOSIS — R5383 Other fatigue: Secondary | ICD-10-CM | POA: Diagnosis not present

## 2018-05-13 DIAGNOSIS — E7800 Pure hypercholesterolemia, unspecified: Secondary | ICD-10-CM | POA: Diagnosis not present

## 2018-05-13 DIAGNOSIS — M129 Arthropathy, unspecified: Secondary | ICD-10-CM | POA: Diagnosis not present

## 2018-05-13 DIAGNOSIS — D539 Nutritional anemia, unspecified: Secondary | ICD-10-CM | POA: Diagnosis not present

## 2018-05-13 DIAGNOSIS — Z131 Encounter for screening for diabetes mellitus: Secondary | ICD-10-CM | POA: Diagnosis not present

## 2018-05-13 DIAGNOSIS — E559 Vitamin D deficiency, unspecified: Secondary | ICD-10-CM | POA: Diagnosis not present

## 2018-05-14 ENCOUNTER — Encounter: Payer: Self-pay | Admitting: *Deleted

## 2018-05-18 ENCOUNTER — Other Ambulatory Visit: Payer: Self-pay

## 2018-05-18 ENCOUNTER — Ambulatory Visit (INDEPENDENT_AMBULATORY_CARE_PROVIDER_SITE_OTHER): Payer: BLUE CROSS/BLUE SHIELD | Admitting: Neurology

## 2018-05-18 DIAGNOSIS — F329 Major depressive disorder, single episode, unspecified: Secondary | ICD-10-CM | POA: Diagnosis not present

## 2018-05-18 DIAGNOSIS — G43709 Chronic migraine without aura, not intractable, without status migrainosus: Secondary | ICD-10-CM

## 2018-05-18 DIAGNOSIS — F32A Depression, unspecified: Secondary | ICD-10-CM | POA: Insufficient documentation

## 2018-05-18 DIAGNOSIS — IMO0002 Reserved for concepts with insufficient information to code with codable children: Secondary | ICD-10-CM

## 2018-05-18 MED ORDER — VENLAFAXINE HCL ER 75 MG PO CP24: 75.0000 mg | ORAL_CAPSULE | Freq: Every day | ORAL | 11 refills | Status: DC

## 2018-05-18 MED ORDER — SUMATRIPTAN SUCCINATE 50 MG PO TABS: 50.0000 mg | ORAL_TABLET | ORAL | 6 refills | Status: DC | PRN
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
## 2018-05-18 NOTE — Progress Notes (Signed)
Fail to connect successfully, will reschedule

## 2018-05-19 ENCOUNTER — Ambulatory Visit: Payer: Self-pay | Admitting: Neurology

## 2018-05-19 ENCOUNTER — Encounter: Payer: Self-pay | Admitting: Neurology

## 2018-06-05 DIAGNOSIS — R768 Other specified abnormal immunological findings in serum: Secondary | ICD-10-CM | POA: Diagnosis not present

## 2018-06-05 DIAGNOSIS — F329 Major depressive disorder, single episode, unspecified: Secondary | ICD-10-CM | POA: Diagnosis not present

## 2018-06-05 DIAGNOSIS — F419 Anxiety disorder, unspecified: Secondary | ICD-10-CM | POA: Diagnosis not present

## 2018-06-05 DIAGNOSIS — Z79899 Other long term (current) drug therapy: Secondary | ICD-10-CM | POA: Diagnosis not present

## 2018-06-10 DIAGNOSIS — F411 Generalized anxiety disorder: Secondary | ICD-10-CM | POA: Diagnosis not present

## 2018-06-10 DIAGNOSIS — F430 Acute stress reaction: Secondary | ICD-10-CM | POA: Diagnosis not present

## 2018-06-10 DIAGNOSIS — F332 Major depressive disorder, recurrent severe without psychotic features: Secondary | ICD-10-CM | POA: Diagnosis not present

## 2018-06-10 DIAGNOSIS — F4320 Adjustment disorder, unspecified: Secondary | ICD-10-CM | POA: Diagnosis not present

## 2018-06-12 DIAGNOSIS — D509 Iron deficiency anemia, unspecified: Secondary | ICD-10-CM | POA: Diagnosis not present

## 2018-06-12 DIAGNOSIS — E611 Iron deficiency: Secondary | ICD-10-CM | POA: Diagnosis not present

## 2018-06-16 MED FILL — HEPARIN SOD 10,000 UNIT/ML: 10000 | 30 days supply | Qty: 120 | Fill #0

## 2018-06-16 MED FILL — MONOJECT DISP SYRINGE 20 ML: 20 ML | 30 days supply | Qty: 30 | Fill #0

## 2018-06-16 MED FILL — BD NEEDLES 18GX1.5: 18G X 1-1/2 | 30 days supply | Qty: 30 | Fill #0

## 2018-06-16 MED FILL — BUPIVACAINE 0.5% VIAL: 0.5 | 30 days supply | Qty: 450 | Fill #0

## 2018-06-16 MED FILL — BD NEEDLES 18GX1.5": 18G X 1-1/2 | 30 days supply | Qty: 30 | Fill #0

## 2018-06-19 DIAGNOSIS — R109 Unspecified abdominal pain: Secondary | ICD-10-CM | POA: Diagnosis not present

## 2018-07-14 DIAGNOSIS — N3011 Interstitial cystitis (chronic) with hematuria: Secondary | ICD-10-CM | POA: Diagnosis not present

## 2018-07-28 DIAGNOSIS — N3010 Interstitial cystitis (chronic) without hematuria: Secondary | ICD-10-CM | POA: Diagnosis not present

## 2018-08-04 DIAGNOSIS — F419 Anxiety disorder, unspecified: Secondary | ICD-10-CM | POA: Diagnosis not present

## 2018-08-04 DIAGNOSIS — J9801 Acute bronchospasm: Secondary | ICD-10-CM | POA: Diagnosis not present

## 2018-08-04 DIAGNOSIS — F329 Major depressive disorder, single episode, unspecified: Secondary | ICD-10-CM | POA: Diagnosis not present

## 2018-08-04 DIAGNOSIS — Z79899 Other long term (current) drug therapy: Secondary | ICD-10-CM | POA: Diagnosis not present

## 2018-08-06 DIAGNOSIS — Z79899 Other long term (current) drug therapy: Secondary | ICD-10-CM | POA: Diagnosis not present

## 2018-08-06 DIAGNOSIS — M25552 Pain in left hip: Secondary | ICD-10-CM | POA: Diagnosis not present

## 2018-08-06 DIAGNOSIS — M79641 Pain in right hand: Secondary | ICD-10-CM | POA: Diagnosis not present

## 2018-08-06 DIAGNOSIS — M79672 Pain in left foot: Secondary | ICD-10-CM | POA: Diagnosis not present

## 2018-08-06 DIAGNOSIS — M1990 Unspecified osteoarthritis, unspecified site: Secondary | ICD-10-CM | POA: Diagnosis not present

## 2018-08-06 DIAGNOSIS — M25562 Pain in left knee: Secondary | ICD-10-CM | POA: Diagnosis not present

## 2018-08-06 DIAGNOSIS — M533 Sacrococcygeal disorders, not elsewhere classified: Secondary | ICD-10-CM | POA: Diagnosis not present

## 2018-08-06 DIAGNOSIS — K760 Fatty (change of) liver, not elsewhere classified: Secondary | ICD-10-CM | POA: Diagnosis not present

## 2018-08-06 DIAGNOSIS — M25551 Pain in right hip: Secondary | ICD-10-CM | POA: Diagnosis not present

## 2018-08-06 DIAGNOSIS — M79671 Pain in right foot: Secondary | ICD-10-CM | POA: Diagnosis not present

## 2018-08-06 DIAGNOSIS — D8989 Other specified disorders involving the immune mechanism, not elsewhere classified: Secondary | ICD-10-CM | POA: Diagnosis not present

## 2018-08-06 DIAGNOSIS — M0579 Rheumatoid arthritis with rheumatoid factor of multiple sites without organ or systems involvement: Secondary | ICD-10-CM | POA: Diagnosis not present

## 2018-08-06 DIAGNOSIS — M4186 Other forms of scoliosis, lumbar region: Secondary | ICD-10-CM | POA: Diagnosis not present

## 2018-08-06 DIAGNOSIS — M25561 Pain in right knee: Secondary | ICD-10-CM | POA: Diagnosis not present

## 2018-08-06 DIAGNOSIS — M79642 Pain in left hand: Secondary | ICD-10-CM | POA: Diagnosis not present

## 2018-08-06 DIAGNOSIS — M549 Dorsalgia, unspecified: Secondary | ICD-10-CM | POA: Diagnosis not present

## 2018-08-14 DIAGNOSIS — M25561 Pain in right knee: Secondary | ICD-10-CM | POA: Diagnosis not present

## 2018-08-24 DIAGNOSIS — N3010 Interstitial cystitis (chronic) without hematuria: Secondary | ICD-10-CM | POA: Diagnosis not present

## 2018-08-24 DIAGNOSIS — Z01812 Encounter for preprocedural laboratory examination: Secondary | ICD-10-CM | POA: Diagnosis not present

## 2018-08-24 DIAGNOSIS — M25561 Pain in right knee: Secondary | ICD-10-CM | POA: Diagnosis not present

## 2018-08-24 DIAGNOSIS — Z1159 Encounter for screening for other viral diseases: Secondary | ICD-10-CM | POA: Diagnosis not present

## 2018-08-25 ENCOUNTER — Ambulatory Visit (INDEPENDENT_AMBULATORY_CARE_PROVIDER_SITE_OTHER): Payer: BC Managed Care – PPO | Admitting: Neurology

## 2018-08-25 ENCOUNTER — Encounter: Payer: Self-pay | Admitting: Neurology

## 2018-08-25 ENCOUNTER — Other Ambulatory Visit: Payer: Self-pay

## 2018-08-25 VITALS — BP 132/82 | HR 80 | Temp 97.8°F | Ht 67.0 in | Wt 206.0 lb

## 2018-08-25 DIAGNOSIS — F4310 Post-traumatic stress disorder, unspecified: Secondary | ICD-10-CM | POA: Diagnosis not present

## 2018-08-25 DIAGNOSIS — G43709 Chronic migraine without aura, not intractable, without status migrainosus: Secondary | ICD-10-CM

## 2018-08-25 DIAGNOSIS — IMO0002 Reserved for concepts with insufficient information to code with codable children: Secondary | ICD-10-CM

## 2018-08-25 MED ORDER — SUMATRIPTAN SUCCINATE 100 MG PO TABS: 100.0000 mg | ORAL_TABLET | ORAL | 6 refills | Status: DC | PRN

## 2018-08-25 MED ORDER — NORTRIPTYLINE HCL 50 MG PO CAPS: 100.0000 mg | ORAL_CAPSULE | Freq: Every day | ORAL | 11 refills | Status: DC

## 2018-08-25 MED ORDER — VENLAFAXINE HCL ER 150 MG PO CP24: 150.0000 mg | ORAL_CAPSULE | Freq: Every day | ORAL | 11 refills | Status: DC

## 2018-08-25 MED ORDER — TIZANIDINE HCL 4 MG PO TABS: 4.0000 mg | ORAL_TABLET | Freq: Every day | ORAL | 6 refills | Status: DC | PRN

## 2018-08-25 MED ORDER — PROPRANOLOL HCL 40 MG PO TABS: 40.0000 mg | ORAL_TABLET | Freq: Two times a day (BID) | ORAL | 11 refills | Status: DC

## 2018-08-25 MED ORDER — ONDANSETRON 4 MG PO TBDP: 4.0000 mg | ORAL_TABLET | Freq: Three times a day (TID) | ORAL | 6 refills | Status: AC | PRN

## 2018-08-25 NOTE — Patient Instructions (Signed)
Migraine prevention:  Effexor xr 150mg  daily  Nortriptyline 50mg  2 tabs every night  Migraine Abortive Treatment: imitrex 100mg  as needed Aleve 220mg  1- 2 tabs Tizanidine 4mg  for muscle relaxant Zofran 4mg  for nause
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
## 2018-08-31 DIAGNOSIS — J45909 Unspecified asthma, uncomplicated: Secondary | ICD-10-CM | POA: Diagnosis not present

## 2018-08-31 DIAGNOSIS — N3010 Interstitial cystitis (chronic) without hematuria: Secondary | ICD-10-CM | POA: Diagnosis not present

## 2018-08-31 DIAGNOSIS — R3915 Urgency of urination: Secondary | ICD-10-CM | POA: Diagnosis not present

## 2018-08-31 DIAGNOSIS — R350 Frequency of micturition: Secondary | ICD-10-CM | POA: Diagnosis not present

## 2018-08-31 DIAGNOSIS — M7918 Myalgia, other site: Secondary | ICD-10-CM | POA: Diagnosis not present

## 2018-08-31 DIAGNOSIS — I10 Essential (primary) hypertension: Secondary | ICD-10-CM | POA: Diagnosis not present

## 2018-08-31 DIAGNOSIS — K219 Gastro-esophageal reflux disease without esophagitis: Secondary | ICD-10-CM | POA: Diagnosis not present

## 2018-09-03 DIAGNOSIS — M0579 Rheumatoid arthritis with rheumatoid factor of multiple sites without organ or systems involvement: Secondary | ICD-10-CM | POA: Diagnosis not present

## 2018-09-03 DIAGNOSIS — I7300 Raynaud's syndrome without gangrene: Secondary | ICD-10-CM | POA: Diagnosis not present

## 2018-09-03 DIAGNOSIS — R748 Abnormal levels of other serum enzymes: Secondary | ICD-10-CM | POA: Diagnosis not present

## 2018-09-03 DIAGNOSIS — K760 Fatty (change of) liver, not elsewhere classified: Secondary | ICD-10-CM | POA: Diagnosis not present

## 2018-09-08 DIAGNOSIS — M1711 Unilateral primary osteoarthritis, right knee: Secondary | ICD-10-CM | POA: Diagnosis not present

## 2018-09-08 DIAGNOSIS — M25561 Pain in right knee: Secondary | ICD-10-CM | POA: Diagnosis not present

## 2018-09-11 DIAGNOSIS — R002 Palpitations: Secondary | ICD-10-CM | POA: Diagnosis not present

## 2018-09-11 DIAGNOSIS — R0602 Shortness of breath: Secondary | ICD-10-CM | POA: Diagnosis not present

## 2018-09-16 DIAGNOSIS — E559 Vitamin D deficiency, unspecified: Secondary | ICD-10-CM | POA: Diagnosis not present

## 2018-09-16 DIAGNOSIS — Z131 Encounter for screening for diabetes mellitus: Secondary | ICD-10-CM | POA: Diagnosis not present

## 2018-09-16 DIAGNOSIS — Z0000 Encounter for general adult medical examination without abnormal findings: Secondary | ICD-10-CM | POA: Diagnosis not present

## 2018-09-16 DIAGNOSIS — Z79899 Other long term (current) drug therapy: Secondary | ICD-10-CM | POA: Diagnosis not present

## 2018-09-16 DIAGNOSIS — Z7251 High risk heterosexual behavior: Secondary | ICD-10-CM | POA: Diagnosis not present

## 2018-09-16 DIAGNOSIS — E611 Iron deficiency: Secondary | ICD-10-CM | POA: Diagnosis not present

## 2018-09-16 DIAGNOSIS — Z114 Encounter for screening for human immunodeficiency virus [HIV]: Secondary | ICD-10-CM | POA: Diagnosis not present

## 2018-09-16 DIAGNOSIS — R0602 Shortness of breath: Secondary | ICD-10-CM | POA: Diagnosis not present

## 2018-09-16 DIAGNOSIS — R350 Frequency of micturition: Secondary | ICD-10-CM | POA: Diagnosis not present

## 2018-09-16 DIAGNOSIS — R5383 Other fatigue: Secondary | ICD-10-CM | POA: Diagnosis not present

## 2018-09-22 DIAGNOSIS — R0602 Shortness of breath: Secondary | ICD-10-CM | POA: Diagnosis not present

## 2018-09-22 DIAGNOSIS — J9801 Acute bronchospasm: Secondary | ICD-10-CM | POA: Diagnosis not present

## 2018-09-29 DIAGNOSIS — R29898 Other symptoms and signs involving the musculoskeletal system: Secondary | ICD-10-CM | POA: Diagnosis not present

## 2018-09-29 DIAGNOSIS — M545 Low back pain: Secondary | ICD-10-CM | POA: Diagnosis not present

## 2018-09-29 DIAGNOSIS — R221 Localized swelling, mass and lump, neck: Secondary | ICD-10-CM | POA: Diagnosis not present

## 2018-10-12 DIAGNOSIS — R29898 Other symptoms and signs involving the musculoskeletal system: Secondary | ICD-10-CM | POA: Diagnosis not present

## 2018-10-12 DIAGNOSIS — Z79891 Long term (current) use of opiate analgesic: Secondary | ICD-10-CM | POA: Diagnosis not present

## 2018-10-12 DIAGNOSIS — R768 Other specified abnormal immunological findings in serum: Secondary | ICD-10-CM | POA: Diagnosis not present

## 2018-10-12 DIAGNOSIS — M791 Myalgia, unspecified site: Secondary | ICD-10-CM | POA: Diagnosis not present

## 2018-10-12 DIAGNOSIS — M25561 Pain in right knee: Secondary | ICD-10-CM | POA: Diagnosis not present

## 2018-10-12 DIAGNOSIS — Z1159 Encounter for screening for other viral diseases: Secondary | ICD-10-CM | POA: Diagnosis not present

## 2018-10-12 DIAGNOSIS — M5126 Other intervertebral disc displacement, lumbar region: Secondary | ICD-10-CM | POA: Diagnosis not present

## 2018-10-13 DIAGNOSIS — J069 Acute upper respiratory infection, unspecified: Secondary | ICD-10-CM | POA: Diagnosis not present

## 2018-10-13 DIAGNOSIS — Z20828 Contact with and (suspected) exposure to other viral communicable diseases: Secondary | ICD-10-CM | POA: Diagnosis not present

## 2018-10-19 DIAGNOSIS — R748 Abnormal levels of other serum enzymes: Secondary | ICD-10-CM | POA: Diagnosis not present

## 2018-10-19 DIAGNOSIS — K760 Fatty (change of) liver, not elsewhere classified: Secondary | ICD-10-CM | POA: Diagnosis not present

## 2018-10-19 DIAGNOSIS — I7300 Raynaud's syndrome without gangrene: Secondary | ICD-10-CM | POA: Diagnosis not present

## 2018-10-19 DIAGNOSIS — M0579 Rheumatoid arthritis with rheumatoid factor of multiple sites without organ or systems involvement: Secondary | ICD-10-CM | POA: Diagnosis not present

## 2018-10-20 DIAGNOSIS — M1711 Unilateral primary osteoarthritis, right knee: Secondary | ICD-10-CM | POA: Diagnosis not present

## 2018-10-22 DIAGNOSIS — M25561 Pain in right knee: Secondary | ICD-10-CM | POA: Diagnosis not present

## 2018-10-22 DIAGNOSIS — K760 Fatty (change of) liver, not elsewhere classified: Secondary | ICD-10-CM | POA: Diagnosis not present

## 2018-10-22 DIAGNOSIS — S83281A Other tear of lateral meniscus, current injury, right knee, initial encounter: Secondary | ICD-10-CM | POA: Diagnosis not present

## 2018-10-22 DIAGNOSIS — M794 Hypertrophy of (infrapatellar) fat pad: Secondary | ICD-10-CM | POA: Diagnosis not present

## 2018-10-22 DIAGNOSIS — K210 Gastro-esophageal reflux disease with esophagitis: Secondary | ICD-10-CM | POA: Diagnosis not present

## 2018-11-18 DIAGNOSIS — M794 Hypertrophy of (infrapatellar) fat pad: Secondary | ICD-10-CM | POA: Diagnosis not present

## 2018-11-18 DIAGNOSIS — M23251 Derangement of posterior horn of lateral meniscus due to old tear or injury, right knee: Secondary | ICD-10-CM | POA: Diagnosis not present

## 2018-11-18 DIAGNOSIS — S83271A Complex tear of lateral meniscus, current injury, right knee, initial encounter: Secondary | ICD-10-CM | POA: Diagnosis not present

## 2018-11-18 DIAGNOSIS — M94261 Chondromalacia, right knee: Secondary | ICD-10-CM | POA: Diagnosis not present

## 2018-11-18 DIAGNOSIS — S83221A Peripheral tear of medial meniscus, current injury, right knee, initial encounter: Secondary | ICD-10-CM | POA: Diagnosis not present

## 2018-11-25 ENCOUNTER — Ambulatory Visit: Payer: BC Managed Care – PPO | Admitting: Neurology

## 2018-12-02 DIAGNOSIS — S83281D Other tear of lateral meniscus, current injury, right knee, subsequent encounter: Secondary | ICD-10-CM | POA: Diagnosis not present

## 2018-12-08 DIAGNOSIS — M25561 Pain in right knee: Secondary | ICD-10-CM | POA: Diagnosis not present

## 2018-12-16 DIAGNOSIS — M5136 Other intervertebral disc degeneration, lumbar region: Secondary | ICD-10-CM | POA: Diagnosis not present

## 2018-12-16 DIAGNOSIS — G894 Chronic pain syndrome: Secondary | ICD-10-CM | POA: Diagnosis not present
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
## 2018-12-21 ENCOUNTER — Ambulatory Visit: Payer: BC Managed Care – PPO | Admitting: Neurology

## 2018-12-21 ENCOUNTER — Encounter: Payer: Self-pay | Admitting: Neurology

## 2018-12-21 ENCOUNTER — Telehealth: Payer: Self-pay

## 2018-12-21 DIAGNOSIS — K2100 Gastro-esophageal reflux disease with esophagitis, without bleeding: Secondary | ICD-10-CM | POA: Diagnosis not present

## 2018-12-21 DIAGNOSIS — Z01818 Encounter for other preprocedural examination: Secondary | ICD-10-CM | POA: Diagnosis not present

## 2018-12-21 NOTE — Telephone Encounter (Signed)
Patient was a no call/no show for their appointment today.   

## 2018-12-31 DIAGNOSIS — A048 Other specified bacterial intestinal infections: Secondary | ICD-10-CM | POA: Diagnosis not present

## 2018-12-31 DIAGNOSIS — K2970 Gastritis, unspecified, without bleeding: Secondary | ICD-10-CM | POA: Diagnosis not present

## 2018-12-31 DIAGNOSIS — K5281 Eosinophilic gastritis or gastroenteritis: Secondary | ICD-10-CM | POA: Diagnosis not present

## 2019-01-01 DIAGNOSIS — K200 Eosinophilic esophagitis: Secondary | ICD-10-CM | POA: Diagnosis not present

## 2019-01-01 DIAGNOSIS — K2270 Barrett's esophagus without dysplasia: Secondary | ICD-10-CM | POA: Diagnosis not present

## 2019-01-09 DIAGNOSIS — R1033 Periumbilical pain: Secondary | ICD-10-CM | POA: Diagnosis not present

## 2019-01-09 DIAGNOSIS — K2100 Gastro-esophageal reflux disease with esophagitis, without bleeding: Secondary | ICD-10-CM | POA: Diagnosis not present

## 2019-01-15 DIAGNOSIS — R1084 Generalized abdominal pain: Secondary | ICD-10-CM | POA: Diagnosis not present

## 2019-01-18 DIAGNOSIS — I7300 Raynaud's syndrome without gangrene: Secondary | ICD-10-CM | POA: Diagnosis not present

## 2019-01-18 DIAGNOSIS — M0579 Rheumatoid arthritis with rheumatoid factor of multiple sites without organ or systems involvement: Secondary | ICD-10-CM | POA: Diagnosis not present

## 2019-01-18 DIAGNOSIS — K760 Fatty (change of) liver, not elsewhere classified: Secondary | ICD-10-CM | POA: Diagnosis not present

## 2019-01-18 DIAGNOSIS — R748 Abnormal levels of other serum enzymes: Secondary | ICD-10-CM | POA: Diagnosis not present

## 2019-02-15 DIAGNOSIS — Z79899 Other long term (current) drug therapy: Secondary | ICD-10-CM | POA: Diagnosis not present

## 2019-02-15 DIAGNOSIS — M62830 Muscle spasm of back: Secondary | ICD-10-CM | POA: Diagnosis not present

## 2019-02-15 DIAGNOSIS — J3489 Other specified disorders of nose and nasal sinuses: Secondary | ICD-10-CM | POA: Diagnosis not present

## 2019-02-15 DIAGNOSIS — Z1159 Encounter for screening for other viral diseases: Secondary | ICD-10-CM | POA: Diagnosis not present

## 2019-02-15 DIAGNOSIS — D539 Nutritional anemia, unspecified: Secondary | ICD-10-CM | POA: Diagnosis not present

## 2019-02-15 DIAGNOSIS — G629 Polyneuropathy, unspecified: Secondary | ICD-10-CM | POA: Diagnosis not present

## 2019-02-15 DIAGNOSIS — R5383 Other fatigue: Secondary | ICD-10-CM | POA: Diagnosis not present

## 2019-02-15 DIAGNOSIS — M5126 Other intervertebral disc displacement, lumbar region: Secondary | ICD-10-CM | POA: Diagnosis not present

## 2019-02-15 DIAGNOSIS — Z131 Encounter for screening for diabetes mellitus: Secondary | ICD-10-CM | POA: Diagnosis not present

## 2019-02-15 DIAGNOSIS — M129 Arthropathy, unspecified: Secondary | ICD-10-CM | POA: Diagnosis not present

## 2019-02-15 DIAGNOSIS — E7800 Pure hypercholesterolemia, unspecified: Secondary | ICD-10-CM | POA: Diagnosis not present

## 2019-02-17 DIAGNOSIS — L0232 Furuncle of buttock: Secondary | ICD-10-CM | POA: Diagnosis not present

## 2019-02-18 ENCOUNTER — Other Ambulatory Visit: Payer: Self-pay

## 2019-02-18 ENCOUNTER — Ambulatory Visit (HOSPITAL_COMMUNITY)
Admission: EM | Admit: 2019-02-18 | Discharge: 2019-02-18 | Disposition: A | Discharge status: Home / Self Care | Payer: BLUE CROSS/BLUE SHIELD | Attending: Physician Assistant | Admitting: Physician Assistant

## 2019-02-18 ENCOUNTER — Encounter (HOSPITAL_COMMUNITY): Payer: Self-pay

## 2019-02-18 DIAGNOSIS — L03317 Cellulitis of buttock: Secondary | ICD-10-CM

## 2019-02-18 DIAGNOSIS — L0291 Cutaneous abscess, unspecified: Secondary | ICD-10-CM | POA: Diagnosis not present

## 2019-02-18 MED ORDER — SULFAMETHOXAZOLE-TRIMETHOPRIM 800-160 MG PO TABS: 1.0000 | ORAL_TABLET | Freq: Two times a day (BID) | ORAL | 0 refills | Status: AC

## 2019-02-18 MED ORDER — LIDOCAINE-EPINEPHRINE (PF) 2 %-1:200000 IJ SOLN
INTRAMUSCULAR | Status: AC
  Filled 2019-02-18: qty 20

## 2019-02-18 NOTE — Discharge Instructions (Signed)
Take the bactrim 2 times a day for 7 days  Massage the area around the wound, some minor drainage it to be expected.  Keep the current dressing on for 24 hours, and then begin changing the dressing daily. Clean the area with soap and water.  If you develop fever, have worsening drainage, worsening redness, please return for re-evaluation.
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
## 2019-02-18 NOTE — ED Triage Notes (Signed)
Patient presents to Urgent Care with complaints of abscess on her buttocks since three days ago. Patient reports it is very painful, worse w/ ambulation.

## 2019-02-24 DIAGNOSIS — K219 Gastro-esophageal reflux disease without esophagitis: Secondary | ICD-10-CM | POA: Diagnosis not present

## 2019-02-24 DIAGNOSIS — H16143 Punctate keratitis, bilateral: Secondary | ICD-10-CM | POA: Diagnosis not present

## 2019-02-24 DIAGNOSIS — R1033 Periumbilical pain: Secondary | ICD-10-CM | POA: Diagnosis not present

## 2019-03-08 DIAGNOSIS — S0992XA Unspecified injury of nose, initial encounter: Secondary | ICD-10-CM | POA: Diagnosis not present

## 2019-03-08 DIAGNOSIS — J0120 Acute ethmoidal sinusitis, unspecified: Secondary | ICD-10-CM | POA: Diagnosis not present

## 2019-03-11 DIAGNOSIS — M5136 Other intervertebral disc degeneration, lumbar region: Secondary | ICD-10-CM | POA: Diagnosis not present

## 2019-03-15 DIAGNOSIS — M25521 Pain in right elbow: Secondary | ICD-10-CM | POA: Diagnosis not present

## 2019-03-15 DIAGNOSIS — M25531 Pain in right wrist: Secondary | ICD-10-CM | POA: Diagnosis not present

## 2019-03-25 ENCOUNTER — Other Ambulatory Visit: Payer: Self-pay

## 2019-03-25 ENCOUNTER — Encounter (HOSPITAL_COMMUNITY): Payer: Self-pay

## 2019-03-25 ENCOUNTER — Ambulatory Visit (HOSPITAL_COMMUNITY)
Admission: EM | Admit: 2019-03-25 | Discharge: 2019-03-25 | Disposition: A | Discharge status: Home / Self Care | Payer: BC Managed Care – PPO | Attending: Family Medicine | Admitting: Family Medicine

## 2019-03-25 DIAGNOSIS — R197 Diarrhea, unspecified: Secondary | ICD-10-CM | POA: Insufficient documentation

## 2019-03-25 DIAGNOSIS — R112 Nausea with vomiting, unspecified: Secondary | ICD-10-CM | POA: Insufficient documentation

## 2019-03-25 DIAGNOSIS — Z20822 Contact with and (suspected) exposure to covid-19: Secondary | ICD-10-CM | POA: Insufficient documentation

## 2019-03-25 MED ORDER — ONDANSETRON 4 MG PO TBDP
8.0000 mg | ORAL_TABLET | Freq: Once | ORAL | Status: AC
  Administered 2019-03-25: 8 mg via ORAL

## 2019-03-25 MED ORDER — LOPERAMIDE HCL 2 MG PO CAPS: 2.0000 mg | ORAL_CAPSULE | Freq: Four times a day (QID) | ORAL | 0 refills | Status: AC | PRN

## 2019-03-25 MED ORDER — ONDANSETRON 4 MG PO TBDP
ORAL_TABLET | ORAL | Status: AC
  Filled 2019-03-25: qty 2

## 2019-03-25 NOTE — Discharge Instructions (Addendum)
Take Zofran for vomiting.  Take 8 mg at a time.  After 20 minutes try some clear liquids.  When you can keep down clear liquids you may try bland food. Take Imodium for the diarrhea.  1 or 2 doses are usually all that is necessary. Avoid fried and spicy food for several days Check for your Covid test result on my chart
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
## 2019-03-25 NOTE — ED Triage Notes (Signed)
Patient presents to Urgent Care with complaints of N/V/D since 3-4 days agp. Patient reports she does not think she ate something to cause this, has had approx 10 episodes of diarrhea today, a few episodes of vomiting.

## 2019-03-27 LAB — NOVEL CORONAVIRUS, NAA (HOSP ORDER, SEND-OUT TO REF LAB; TAT 18-24 HRS): SARS-CoV-2, NAA: NOT DETECTED

## 2019-03-29 DIAGNOSIS — I10 Essential (primary) hypertension: Secondary | ICD-10-CM | POA: Diagnosis not present

## 2019-03-29 DIAGNOSIS — Z20822 Contact with and (suspected) exposure to covid-19: Secondary | ICD-10-CM | POA: Diagnosis not present

## 2019-03-29 DIAGNOSIS — G43911 Migraine, unspecified, intractable, with status migrainosus: Secondary | ICD-10-CM | POA: Diagnosis not present

## 2019-03-29 DIAGNOSIS — K219 Gastro-esophageal reflux disease without esophagitis: Secondary | ICD-10-CM | POA: Diagnosis not present

## 2019-03-29 DIAGNOSIS — E7800 Pure hypercholesterolemia, unspecified: Secondary | ICD-10-CM | POA: Diagnosis not present

## 2019-03-29 DIAGNOSIS — R197 Diarrhea, unspecified: Secondary | ICD-10-CM | POA: Diagnosis not present

## 2019-03-29 DIAGNOSIS — R7303 Prediabetes: Secondary | ICD-10-CM | POA: Diagnosis not present

## 2019-03-29 DIAGNOSIS — Z79899 Other long term (current) drug therapy: Secondary | ICD-10-CM | POA: Diagnosis not present

## 2019-04-01 DIAGNOSIS — M5136 Other intervertebral disc degeneration, lumbar region: Secondary | ICD-10-CM | POA: Diagnosis not present

## 2019-04-01 DIAGNOSIS — M542 Cervicalgia: Secondary | ICD-10-CM | POA: Diagnosis not present

## 2019-04-01 DIAGNOSIS — G894 Chronic pain syndrome: Secondary | ICD-10-CM | POA: Diagnosis not present

## 2019-04-01 DIAGNOSIS — M545 Low back pain: Secondary | ICD-10-CM | POA: Diagnosis not present

## 2019-04-05 DIAGNOSIS — Z780 Asymptomatic menopausal state: Secondary | ICD-10-CM | POA: Diagnosis not present

## 2019-04-14 ENCOUNTER — Encounter: Payer: Self-pay | Admitting: Neurology

## 2019-04-14 DIAGNOSIS — K219 Gastro-esophageal reflux disease without esophagitis: Secondary | ICD-10-CM | POA: Diagnosis not present

## 2019-04-14 DIAGNOSIS — R945 Abnormal results of liver function studies: Secondary | ICD-10-CM | POA: Diagnosis not present

## 2019-04-14 DIAGNOSIS — K760 Fatty (change of) liver, not elsewhere classified: Secondary | ICD-10-CM | POA: Diagnosis not present

## 2019-04-14 DIAGNOSIS — Z1159 Encounter for screening for other viral diseases: Secondary | ICD-10-CM | POA: Diagnosis not present

## 2019-04-15 DIAGNOSIS — M47816 Spondylosis without myelopathy or radiculopathy, lumbar region: Secondary | ICD-10-CM | POA: Diagnosis not present

## 2019-04-19 DIAGNOSIS — M0579 Rheumatoid arthritis with rheumatoid factor of multiple sites without organ or systems involvement: Secondary | ICD-10-CM | POA: Diagnosis not present

## 2019-04-19 DIAGNOSIS — R748 Abnormal levels of other serum enzymes: Secondary | ICD-10-CM | POA: Diagnosis not present

## 2019-04-19 DIAGNOSIS — I7300 Raynaud's syndrome without gangrene: Secondary | ICD-10-CM | POA: Diagnosis not present

## 2019-04-19 DIAGNOSIS — K760 Fatty (change of) liver, not elsewhere classified: Secondary | ICD-10-CM | POA: Diagnosis not present

## 2019-04-23 ENCOUNTER — Ambulatory Visit (INDEPENDENT_AMBULATORY_CARE_PROVIDER_SITE_OTHER): Payer: BC Managed Care – PPO | Admitting: Internal Medicine

## 2019-04-23 ENCOUNTER — Encounter: Payer: Self-pay | Admitting: Internal Medicine

## 2019-04-23 ENCOUNTER — Other Ambulatory Visit: Payer: Self-pay

## 2019-04-23 VITALS — BP 92/64 | HR 60 | Temp 97.5°F | Ht 67.0 in | Wt 219.6 lb

## 2019-04-23 DIAGNOSIS — J4520 Mild intermittent asthma, uncomplicated: Secondary | ICD-10-CM

## 2019-04-23 DIAGNOSIS — J301 Allergic rhinitis due to pollen: Secondary | ICD-10-CM

## 2019-04-23 MED ORDER — FLUTICASONE PROPIONATE 50 MCG/ACT NA SUSP: 1.0000 | Freq: Every day | NASAL | 2 refills | Status: DC

## 2019-04-23 MED ORDER — FLUTICASONE-SALMETEROL 250-50 MCG/DOSE IN AEPB: 1.0000 | INHALATION_SPRAY | Freq: Two times a day (BID) | RESPIRATORY_TRACT | 5 refills | Status: DC

## 2019-04-23 MED ORDER — DIPHENHYDRAMINE HCL 25 MG PO CAPS: 25.0000 mg | ORAL_CAPSULE | Freq: Four times a day (QID) | ORAL | 0 refills | Status: DC | PRN
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
## 2019-04-23 NOTE — Patient Instructions (Addendum)
The patient should have follow up scheduled with myself in 2 months.   Prior to next visit patient should have: Spirometry/Feno  New medications: flonase and advair. Keep taking Albuterol rescue inhaler as needed   Flonase - 1 spray on each side of your nose twice a day for first week, then 1 spray on each side.   Instructions for use:  If you also use a saline nasal spray or rinse, use that first.  Position the head with the chin slightly tucked. Use the right hand to spray into the left nostril and the right hand to spray into the left nostril.   Point the bottle away from the septum of your nose (cartilage that divides the two sides of your nose).   Hold the nostril closed on the opposite side from where you will spray  Spray once and gently sniff to pull the medicine into the higher parts of your nose.  Don't sniff too hard as the medicine will drain down the back of your throat instead.  Repeat with a second spray on the same side if prescribed.  Repeat on the other side of your nose.   Instructions For Advair Diskus Take the ADVAIR DISKUS out of the box and foil overwrap pouch. Write the "Pouch opened" and "Use by" dates on the label on top of the DISKUS. The "Use by" date is 1 month from date of opening the pouch.     * The DISKUS will be in the closed position when the pouch is opened.     * The dose indicator on the top of the DISKUS tells you how many doses are left. The dose indicator number will decrease each time you use the DISKUS. After you have used 55 doses from the DISKUS, the numbers 5 to 0 will appear in red to warn you that there are only a few doses left. If you are using a "sample" DISKUS, the numbers 5 to 0 will appear in red after 23 doses.  Taking a dose from the DISKUS requires the following 3 simple steps: Open, Click, Inhale.  1. OPEN Hold the DISKUS in one hand and put the thumb of your other hand on the thumbgrip. Push your thumb away from you as far  as it will go until the mouthpiece appears and snaps into position.  2.   CLICK Hold the DISKUS in a level, flat position with the mouthpiece toward you. Slide the lever away from you as far as it will go until it clicks. The DISKUS is now ready to use. Every time the lever is pushed back, a dose is ready to be inhaled. This is shown by a decrease in numbers on the dose counter. To avoid releasing or wasting doses once the DISKUS is ready: Do not close the DISKUS. Do not tilt the DISKUS. Do not play with the lever. Do not move the lever more than once.  3.   INHALE Before inhaling your dose from the DISKUS, breathe out (exhale) fully while holding the DISKUS level and away from your mouth. Remember, never breathe out into the DISKUS mouthpiece.  Put the mouthpiece to your lips. Breathe in quickly and deeply through the DISKUS. Do not breathe in through your nose.  Remove the DISKUS from your mouth. Hold your breath for about 10 seconds, or for as long as is comfortable. Breathe out slowly.  The DISKUS delivers your dose of medicine as a very fine powder. Most patients can taste or  feel the powder. Do not use another dose from the DISKUS if you do not feel or taste the medicine.  Rinse your mouth with water after breathing in the medicine. Spit the water out. Do not swallow.  4.  CLOSE the DISKUS when you are finished taking a dose so that the DISKUS will be ready for you to take your next dose. Put your thumb on the thumbgrip and slide the thumbgrip back toward you as far as it will go. The DISKUS will click shut. The lever will automatically return to its original position. The DISKUS is now ready for you to take your next scheduled dose, due in about 12 hours. (Repeat the steps 1 to 4.)  REMEMBER: Never breathe into the DISKUS. Never take the DISKUS apart. Always ready and use the DISKUS in a level, flat position. Do not use the DISKUS with a spacer device. After each dose, rinse your  mouth with water and spit the water out. Do not swallow. Never wash the mouthpiece or any part of the DISKUS. Keep it dry. Always keep the DISKUS in a dry place. Never take an extra dose, even if you did not taste or feel the medicine.  By learning about asthma and how it can be controlled, you take an important step toward managing this disease. Work closely with your asthma care team to learn all you can about your asthma, how to avoid triggers, what your medications do, and how to take them correctly. With proper care, you can live free of asthma symptoms and maintain a normal, healthy lifestyle.   What is asthma? Asthma is a chronic disease that affects the airways of the lungs. During normal breathing, the bands of muscle that surround the airways are relaxed and air moves freely. During an asthma episode or "attack," there are three main changes that stop air from moving easily through the airways:  The bands of muscle that surround the airways tighten and make the airways narrow. This tightening is called bronchospasm.   The lining of the airways becomes swollen or inflamed.   The cells that line the airways produce more mucus, which is thicker than normal and clogs the airways.  These three factors - bronchospasm, inflammation, and mucus production - cause symptoms such as difficulty breathing, wheezing, and coughing.  What are the most common symptoms of asthma? Asthma symptoms are not the same for everyone. They can even change from episode to episode in the same person. Also, you may have only one symptom of asthma, such as cough, but another person may have all the symptoms of asthma. It is important to know all the symptoms of asthma and to be aware that your asthma can present in any of these ways at any time. The most common symptoms include: . Coughing, especially at night  . Shortness of breath  . Wheezing  . Chest tightness, pain, or pressure   Who is affected by  asthma? Asthma affects 22 million Americans; about 6 million of these are children under age 67. People who have a family history of asthma have an increased risk of developing the disease. Asthma is also more common in people who have allergies or who are exposed to tobacco smoke. However, anyone can develop asthma at any time. Some people may have asthma all of their lives, while others may develop it as adults.  What causes asthma? The airways in a person with asthma are very sensitive and react to many things, or "  triggers." Contact with these triggers causes asthma symptoms. One of the most important parts of asthma control is to identify your triggers and then avoid them when possible. The only trigger you do not want to avoid is exercise. Pre-treatment with medicines before exercise can allow you to stay active yet avoid asthma symptoms. Common asthma triggers include: 1. Infections (colds, viruses, flu, sinus infections)  2. Exercise  3. Weather (changes in temperature and/or humidity, cold air)  4. Tobacco smoke  5. Allergens (dust mites, pollens, pets, mold spores, cockroaches, and sometimes foods)  6. Irritants (strong odors from cleaning products, perfume, wood smoke, air pollution)  7. Strong emotions such as crying or laughing hard  8. Some medications   How is asthma diagnosed? To diagnose asthma, your doctor will first review your medical history, family history, and symptoms. Your doctor will want to know any past history of breathing problems you may have had, as well as a family history of asthma, allergies, eczema (a bumpy, itchy skin rash caused by allergies), or other lung disease. It is important that you describe your symptoms in detail (cough, wheeze, shortness of breath, chest tightness), including when and how often they occur. The doctor will perform a physical examination and listen to your heart and lungs. He or she may also order breathing tests, allergy tests, blood  tests, and chest and sinus X-rays. The tests will find out if you do have asthma and if there are any other conditions that are contributing factors.  How is asthma treated? Asthma can be controlled, but not cured. It is not normal to have frequent symptoms, trouble sleeping, or trouble completing tasks. Appropriate asthma care will prevent symptoms and visits to the emergency room and hospital. Asthma medicines are one of the mainstays of asthma treatment. The drugs used to treat asthma are explained below.  Anti-inflammatories: These are the most important drugs for most people with asthma. Anti-inflammatory drugs reduce swelling and mucus production in the airways. As a result, airways are less sensitive and less likely to react to triggers. These medications need to be taken daily and may need to be taken for several weeks before they begin to control asthma. Anti-inflammatory medicines lead to fewer symptoms, better airflow, less sensitive airways, less airway damage, and fewer asthma attacks. If taken every day, they CONTROL or prevent asthma symptoms.   Bronchodilators: These drugs relax the muscle bands that tighten around the airways. This action opens the airways, letting more air in and out of the lungs and improving breathing. Bronchodilators also help clear mucus from the lungs. As the airways open, the mucus moves more freely and can be coughed out more easily. In short-acting forms, bronchodilators RELIEVE or stop asthma symptoms by quickly opening the airways and are very helpful during an asthma episode. In long-acting forms, bronchodilators provide CONTROL of asthma symptoms and prevent asthma episodes.  Asthma drugs can be taken in a variety of ways. Inhaling the medications by using a metered dose inhaler, dry powder inhaler, or nebulizer is one way of taking asthma medicines. Oral medicines (pills or liquids you swallow) may also be prescribed.  Asthma severity Asthma is classified as  either "intermittent" (comes and goes) or "persistent" (lasting). Persistent asthma is further described as being mild, moderate, or severe. The severity of asthma is based on how often you have symptoms both during the day and night, as well as by the results of lung function tests and by how well you can perform activities.  The "severity" of asthma refers to how "intense" or "strong" your asthma is.  Asthma control Asthma control is the goal of asthma treatment. Regardless of your asthma severity, it may or may not be controlled. Asthma control means: . You are able to do everything you want to do at work and home  . You have no (or minimal) asthma symptoms  . You do not wake up from your sleep or earlier than usual in the morning due to asthma  . You rarely need to use your reliever medicine (inhaler)  Another major part of your treatment is that you are happy with your asthma care and believe your asthma is controlled.  Monitoring symptoms A key part of treatment is keeping track of how well your lungs are working. Monitoring your symptoms  what they are, how and when they happen, and how severe they are  is an important part of being able to control your asthma.  Sometimes asthma is monitored using a peak flow meter. A peak flow (PF) meter measures how fast the air comes out of your lungs. It can help you know when your asthma is getting worse, sometimes even before you have symptoms. By taking daily peak flow readings, you can learn when to adjust medications to keep asthma under good control. It is also used to create your asthma action plan (see below). Your doctor can use your peak flow readings to adjust your treatment plan in some cases.  Asthma Action Plan Based on your history and asthma severity, you and your doctor will develop a care plan called an "asthma action plan." The asthma action plan describes when and how to use your medicines, actions to take when asthma worsens, and  when to seek emergency care. Make sure you understand this plan. If you do not, ask your asthma care provider any questions you may have. Your asthma action plan is one of the keys to controlling asthma. Keep it readily available to remind you of what you need to do every day to control asthma and what you need to do when symptoms occur.  Goals of asthma therapy These are the goals of asthma treatment: . Live an active, normal life  . Prevent chronic and troublesome symptoms  . Attend work or school every day  . Perform daily activities without difficulty  . Stop urgent visits to the doctor, emergency department, or hospital  . Use and adjust medications to control asthma with few or no side effects

## 2019-04-26 DIAGNOSIS — Z79899 Other long term (current) drug therapy: Secondary | ICD-10-CM | POA: Diagnosis not present

## 2019-04-26 DIAGNOSIS — M62830 Muscle spasm of back: Secondary | ICD-10-CM | POA: Diagnosis not present

## 2019-04-26 DIAGNOSIS — G629 Polyneuropathy, unspecified: Secondary | ICD-10-CM | POA: Diagnosis not present

## 2019-04-26 DIAGNOSIS — M5126 Other intervertebral disc displacement, lumbar region: Secondary | ICD-10-CM | POA: Diagnosis not present

## 2019-04-26 DIAGNOSIS — R768 Other specified abnormal immunological findings in serum: Secondary | ICD-10-CM | POA: Diagnosis not present

## 2019-05-12 DIAGNOSIS — K760 Fatty (change of) liver, not elsewhere classified: Secondary | ICD-10-CM | POA: Diagnosis not present

## 2019-05-12 DIAGNOSIS — R945 Abnormal results of liver function studies: Secondary | ICD-10-CM | POA: Diagnosis not present

## 2019-05-13 ENCOUNTER — Telehealth: Payer: Self-pay | Admitting: Internal Medicine

## 2019-05-13 ENCOUNTER — Encounter: Payer: Self-pay | Admitting: Internal Medicine

## 2019-05-13 ENCOUNTER — Ambulatory Visit (INDEPENDENT_AMBULATORY_CARE_PROVIDER_SITE_OTHER): Payer: BC Managed Care – PPO | Admitting: Internal Medicine

## 2019-05-13 ENCOUNTER — Telehealth: Payer: Self-pay | Admitting: *Deleted

## 2019-05-13 DIAGNOSIS — J301 Allergic rhinitis due to pollen: Secondary | ICD-10-CM | POA: Diagnosis not present

## 2019-05-13 DIAGNOSIS — J4540 Moderate persistent asthma, uncomplicated: Secondary | ICD-10-CM

## 2019-05-13 MED ORDER — MONTELUKAST SODIUM 10 MG PO TABS: 10.0000 mg | ORAL_TABLET | Freq: Every day | ORAL | 5 refills | Status: DC

## 2019-05-13 NOTE — Telephone Encounter (Signed)
OK. Please offer the patient a telephone visit today to discuss further. I have a 2:45 slot today and can also open a 4pm slot for her

## 2019-05-13 NOTE — Telephone Encounter (Signed)
Called and spoke with pt stating to her that Dr. Shearon Stalls wants [place] to schedule a televisit with her to further evaluate. Pt verbalized understanding. Pt has been scheduled an appt with Dr. Shearon Stalls for televisit today at 2:45. Nothing further needed.

## 2019-05-13 NOTE — Telephone Encounter (Signed)
Pt returning call. [phone]
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
## 2019-05-13 NOTE — Telephone Encounter (Signed)
Called pt but unable to reach. Left message for pt to return call. 

## 2019-05-13 NOTE — Telephone Encounter (Signed)
Please route to Dr. Shearon Stalls as she is in office.  Wyn Quaker, FNP

## 2019-05-13 NOTE — Telephone Encounter (Signed)
Spoke with pt. States that her coughing has increased her last OV. Cough is non productive and can be "violent" at times. States, "I am tired in my lungs." Denies fever/chills, congestion/runny nose, sore throat, severe headache, joint pain, unexplained muscle aches, increased shortness of breath, loss of taste or smell, rash, N/V/D, abdominal pain, redness around/in the eye, increased weakness or unexplained bruising or bleeding.  She has been taking all of the medications (Advair, Flonase and Albuterol HFA) as Dr. Shearon Stalls recommended but nothing is helping her cough.  Aaron Edelman - please advise. Thanks.

## 2019-05-13 NOTE — Telephone Encounter (Signed)
My apologies.  Dr. Shearon Stalls - please advise. Thanks.

## 2019-05-26 DIAGNOSIS — R197 Diarrhea, unspecified: Secondary | ICD-10-CM | POA: Diagnosis not present

## 2019-05-26 DIAGNOSIS — K219 Gastro-esophageal reflux disease without esophagitis: Secondary | ICD-10-CM | POA: Diagnosis not present

## 2019-05-26 DIAGNOSIS — R1011 Right upper quadrant pain: Secondary | ICD-10-CM | POA: Diagnosis not present

## 2019-05-26 DIAGNOSIS — Z9049 Acquired absence of other specified parts of digestive tract: Secondary | ICD-10-CM | POA: Diagnosis not present

## 2019-05-28 DIAGNOSIS — R109 Unspecified abdominal pain: Secondary | ICD-10-CM | POA: Diagnosis not present

## 2019-05-28 DIAGNOSIS — N3010 Interstitial cystitis (chronic) without hematuria: Secondary | ICD-10-CM | POA: Diagnosis not present

## 2019-05-28 DIAGNOSIS — Z87442 Personal history of urinary calculi: Secondary | ICD-10-CM | POA: Diagnosis not present

## 2019-06-01 DIAGNOSIS — N200 Calculus of kidney: Secondary | ICD-10-CM | POA: Diagnosis not present

## 2019-06-01 DIAGNOSIS — K760 Fatty (change of) liver, not elsewhere classified: Secondary | ICD-10-CM | POA: Diagnosis not present

## 2019-06-01 DIAGNOSIS — R109 Unspecified abdominal pain: Secondary | ICD-10-CM | POA: Diagnosis not present

## 2019-06-02 DIAGNOSIS — R197 Diarrhea, unspecified: Secondary | ICD-10-CM | POA: Diagnosis not present

## 2019-06-08 DIAGNOSIS — Z1152 Encounter for screening for COVID-19: Secondary | ICD-10-CM | POA: Diagnosis not present

## 2019-06-14 DIAGNOSIS — K5289 Other specified noninfective gastroenteritis and colitis: Secondary | ICD-10-CM | POA: Diagnosis not present

## 2019-06-14 DIAGNOSIS — K208 Other esophagitis without bleeding: Secondary | ICD-10-CM | POA: Diagnosis not present

## 2019-06-14 DIAGNOSIS — K2950 Unspecified chronic gastritis without bleeding: Secondary | ICD-10-CM | POA: Diagnosis not present

## 2019-06-14 DIAGNOSIS — K219 Gastro-esophageal reflux disease without esophagitis: Secondary | ICD-10-CM | POA: Diagnosis not present

## 2019-06-14 DIAGNOSIS — K2960 Other gastritis without bleeding: Secondary | ICD-10-CM | POA: Diagnosis not present

## 2019-06-14 DIAGNOSIS — K2210 Ulcer of esophagus without bleeding: Secondary | ICD-10-CM | POA: Diagnosis not present

## 2019-06-14 DIAGNOSIS — K2100 Gastro-esophageal reflux disease with esophagitis, without bleeding: Secondary | ICD-10-CM | POA: Diagnosis not present

## 2019-06-14 DIAGNOSIS — K2980 Duodenitis without bleeding: Secondary | ICD-10-CM | POA: Diagnosis not present

## 2019-06-14 DIAGNOSIS — K449 Diaphragmatic hernia without obstruction or gangrene: Secondary | ICD-10-CM | POA: Diagnosis not present
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
## 2019-06-15 ENCOUNTER — Encounter: Payer: Self-pay | Admitting: Neurology

## 2019-06-15 ENCOUNTER — Other Ambulatory Visit: Payer: Self-pay

## 2019-06-15 ENCOUNTER — Ambulatory Visit (INDEPENDENT_AMBULATORY_CARE_PROVIDER_SITE_OTHER): Payer: BC Managed Care – PPO | Admitting: Neurology

## 2019-06-15 VITALS — BP 144/73 | HR 82 | Ht 67.0 in | Wt 222.0 lb

## 2019-06-15 DIAGNOSIS — G43719 Chronic migraine without aura, intractable, without status migrainosus: Secondary | ICD-10-CM

## 2019-06-15 DIAGNOSIS — F329 Major depressive disorder, single episode, unspecified: Secondary | ICD-10-CM | POA: Diagnosis not present

## 2019-06-15 DIAGNOSIS — F419 Anxiety disorder, unspecified: Secondary | ICD-10-CM

## 2019-06-15 MED ORDER — VENLAFAXINE HCL ER 37.5 MG PO CP24: ORAL_CAPSULE | ORAL | 0 refills | Status: DC

## 2019-06-15 MED ORDER — ELETRIPTAN HYDROBROMIDE 40 MG PO TABS: 40.0000 mg | ORAL_TABLET | ORAL | 3 refills | Status: DC | PRN

## 2019-06-15 NOTE — Patient Instructions (Signed)
1.  Start venlafaxine XR 37.5mg  capsule in morning with breakfast for one week, then 2 capsules every morning with breakfast 2.  Take eletriptan 40mg  for acute migraine.  May repeat in 2 hours if needed.  Maximum 2 tablets in 24 hours 3.  Stop Excedrin 4.  Limit use of pain relievers to no more than 2 days out of week to prevent risk of rebound or medication-overuse headache. 5.  Keep headache diary 6.  Do not skip meals.  Increase water intake.  Exercise 7.  Follow up in  87months.

## 2019-06-21 DIAGNOSIS — I7300 Raynaud's syndrome without gangrene: Secondary | ICD-10-CM | POA: Diagnosis not present

## 2019-06-21 DIAGNOSIS — M0579 Rheumatoid arthritis with rheumatoid factor of multiple sites without organ or systems involvement: Secondary | ICD-10-CM | POA: Diagnosis not present

## 2019-06-21 DIAGNOSIS — K760 Fatty (change of) liver, not elsewhere classified: Secondary | ICD-10-CM | POA: Diagnosis not present

## 2019-06-21 DIAGNOSIS — R748 Abnormal levels of other serum enzymes: Secondary | ICD-10-CM | POA: Diagnosis not present

## 2019-06-23 DIAGNOSIS — K582 Mixed irritable bowel syndrome: Secondary | ICD-10-CM | POA: Diagnosis not present

## 2019-06-23 DIAGNOSIS — K2980 Duodenitis without bleeding: Secondary | ICD-10-CM | POA: Diagnosis not present

## 2019-06-23 DIAGNOSIS — K2210 Ulcer of esophagus without bleeding: Secondary | ICD-10-CM | POA: Diagnosis not present

## 2019-06-23 DIAGNOSIS — K2960 Other gastritis without bleeding: Secondary | ICD-10-CM | POA: Diagnosis not present

## 2019-06-25 DIAGNOSIS — M7711 Lateral epicondylitis, right elbow: Secondary | ICD-10-CM | POA: Diagnosis not present

## 2019-06-25 DIAGNOSIS — M47816 Spondylosis without myelopathy or radiculopathy, lumbar region: Secondary | ICD-10-CM | POA: Diagnosis not present

## 2019-06-29 ENCOUNTER — Other Ambulatory Visit (HOSPITAL_COMMUNITY)
Admission: RE | Admit: 2019-06-29 | Discharge: 2019-06-29 | Disposition: A | Discharge status: Home / Self Care | Payer: BC Managed Care – PPO | Source: Ambulatory Visit | Attending: Internal Medicine | Admitting: Internal Medicine

## 2019-06-29 DIAGNOSIS — Z20822 Contact with and (suspected) exposure to covid-19: Secondary | ICD-10-CM | POA: Insufficient documentation

## 2019-06-29 DIAGNOSIS — Z01812 Encounter for preprocedural laboratory examination: Secondary | ICD-10-CM | POA: Insufficient documentation

## 2019-06-29 LAB — SARS CORONAVIRUS 2 (TAT 6-24 HRS): SARS Coronavirus 2: NEGATIVE

## 2019-07-01 ENCOUNTER — Other Ambulatory Visit: Payer: Self-pay | Admitting: *Deleted

## 2019-07-01 DIAGNOSIS — J4540 Moderate persistent asthma, uncomplicated: Secondary | ICD-10-CM

## 2019-07-01 DIAGNOSIS — J301 Allergic rhinitis due to pollen: Secondary | ICD-10-CM

## 2019-07-02 ENCOUNTER — Ambulatory Visit (INDEPENDENT_AMBULATORY_CARE_PROVIDER_SITE_OTHER): Payer: BC Managed Care – PPO | Admitting: Internal Medicine

## 2019-07-02 ENCOUNTER — Other Ambulatory Visit: Payer: Self-pay

## 2019-07-02 ENCOUNTER — Encounter: Payer: Self-pay | Admitting: Internal Medicine

## 2019-07-02 VITALS — BP 120/78 | HR 75 | Temp 98.0°F | Ht 67.0 in | Wt 219.8 lb

## 2019-07-02 DIAGNOSIS — J4520 Mild intermittent asthma, uncomplicated: Secondary | ICD-10-CM

## 2019-07-02 DIAGNOSIS — J301 Allergic rhinitis due to pollen: Secondary | ICD-10-CM

## 2019-07-02 DIAGNOSIS — J4540 Moderate persistent asthma, uncomplicated: Secondary | ICD-10-CM | POA: Diagnosis not present

## 2019-07-02 LAB — NITRIC OXIDE: Nitric Oxide: 10

## 2019-07-02 MED ORDER — DIPHENHYDRAMINE HCL 50 MG PO CAPS: 50.0000 mg | ORAL_CAPSULE | Freq: Every day | ORAL | 11 refills | Status: DC

## 2019-07-02 MED ORDER — ALBUTEROL SULFATE HFA 108 (90 BASE) MCG/ACT IN AERS: 2.0000 | INHALATION_SPRAY | Freq: Four times a day (QID) | RESPIRATORY_TRACT | 11 refills | Status: DC | PRN

## 2019-07-02 MED ORDER — ADVAIR HFA 115-21 MCG/ACT IN AERO: 2.0000 | INHALATION_SPRAY | Freq: Two times a day (BID) | RESPIRATORY_TRACT | 12 refills | Status: DC
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
## 2019-07-02 NOTE — Patient Instructions (Addendum)
The patient should have follow up scheduled with myself in 4 months.   I am switching you from the powdered advair to the MDI version. I think your reflux is making asthma worse so please follow up with your GI doctors about this.  Keep taking singulair, benadryl, flonase.   ------------------------------------------------------------------------------------------------------------------- Metered Dose Inhaler (MDI) Instructions (Albuterol, Advair)  Before using your inhaler for the first time: 1. Take the cap off the mouthpiece. 2. Shake the inhaler for 5 seconds 3. Press down on the canister to spray the medicine into the air. 4. Repeat these steps 3 more times. If you haven't used your inhaler in more than 2 weeks, repeat these steps before using it.  To use your inhaler: 1. Take the cap off the mouthpiece 2. Shake the inhaler for 5 seconds. 3. Hold it upright with your finger on the top of the canister and your thumb on the bottom of the inhaler. 4. Breathe out. 5. Close your lips around the mouthpiece. 6. As you start to inhale the next breath, press down on the canister. 7. Inhale deeply and slowly through your mouth. 8. Hold your breath for 5 to 10 seconds to keep the medicine in your lungs. 9. Let your breath out.   10. Repeat these steps if you are supposed to take 2 puffs.  11. Put the cap back on the mouthpiece 12. Remember to rinse, gargle and spit with water after use if your inhaler has a steroid in it (Advair, Symbicort, Dulera, Qvar, Flovent)  Caring for your MDI and chamber For most MDIs, remove the canister and rinse the plastic holder with warm running water once a week to prevent the holes from getting clogged. Shake well and let air dry. There are some medications in which the inhaler cannot be removed from the holder. These usually need to be cleaned by wiping the mouthpiece with a cloth or cleaning with a dry cotton swab. Refer to the patient instructions that  come with your inhaler. Clean the chamber about once a week. Remove the soft ring at the end of the chamber. Soak the spacer in warm water with a mild detergent. Carefully clean and, rinse, and shake off excess water. Do not hand dry. Allow to completely air dry. Do not store the chamber in a plastic bag.  Checking your MDI It is important that you know how much medication is left in your inhaler. The number of puffs contained in your MDI is printed on the side of the canister. After you have used that number of puffs, you must discard your inhaler even if it continues to spray. Keep track of how many puffs you have used. You also must include priming puffs in this total. If you use an MDI every day for control of symptoms, you can determine how long it will last by dividing the total number of puffs in the MDI by the total puffs you use every day. For example: 2 puffs x 2 times per day = 4 total puffs per day. At 120 puffs, the MDI will last 30 days. If you use an inhaler only when you need to, you must keep track of how many times you spray the inhaler. Some of the newer MDIs have counting devices built in.  If your MDI does not have a dose counter, you can obtain a device that attaches to the MDI and counts down the number of puffs each time you press the inhaler. Ask your health care professional  for more information about these devices, as well as how to best keep track of your medicine without an add-on device (if you prefer).

## 2019-07-06 ENCOUNTER — Other Ambulatory Visit: Payer: Self-pay

## 2019-07-06 ENCOUNTER — Encounter (HOSPITAL_COMMUNITY): Payer: Self-pay

## 2019-07-06 ENCOUNTER — Emergency Department (HOSPITAL_COMMUNITY)
Admission: EM | Admit: 2019-07-06 | Discharge: 2019-07-07 | Disposition: A | Discharge status: Home / Self Care | Payer: BC Managed Care – PPO | Attending: Emergency Medicine | Admitting: Emergency Medicine

## 2019-07-06 DIAGNOSIS — G510 Bell's palsy: Secondary | ICD-10-CM | POA: Diagnosis not present

## 2019-07-06 DIAGNOSIS — Z5321 Procedure and treatment not carried out due to patient leaving prior to being seen by health care provider: Secondary | ICD-10-CM | POA: Diagnosis not present

## 2019-07-06 DIAGNOSIS — R29810 Facial weakness: Secondary | ICD-10-CM | POA: Insufficient documentation

## 2019-07-06 DIAGNOSIS — R200 Anesthesia of skin: Secondary | ICD-10-CM | POA: Diagnosis not present

## 2019-07-06 NOTE — ED Triage Notes (Addendum)
Pt states she woke up this morning around 0800 with left sided facial droop. Says she went to a [employer] building and was told it could be bells palsy.

## 2019-07-07 DIAGNOSIS — M25521 Pain in right elbow: Secondary | ICD-10-CM | POA: Diagnosis not present

## 2019-07-07 DIAGNOSIS — M7711 Lateral epicondylitis, right elbow: Secondary | ICD-10-CM | POA: Diagnosis not present

## 2019-07-07 NOTE — ED Notes (Signed)
Pt eloped from waiting area. Called 3X.  

## 2019-07-09 DIAGNOSIS — K219 Gastro-esophageal reflux disease without esophagitis: Secondary | ICD-10-CM | POA: Diagnosis not present

## 2019-07-09 DIAGNOSIS — J45909 Unspecified asthma, uncomplicated: Secondary | ICD-10-CM | POA: Diagnosis not present

## 2019-07-09 DIAGNOSIS — Z7951 Long term (current) use of inhaled steroids: Secondary | ICD-10-CM | POA: Diagnosis not present

## 2019-07-09 DIAGNOSIS — M7918 Myalgia, other site: Secondary | ICD-10-CM | POA: Diagnosis not present

## 2019-07-09 DIAGNOSIS — Z9889 Other specified postprocedural states: Secondary | ICD-10-CM | POA: Diagnosis not present

## 2019-07-09 DIAGNOSIS — N3010 Interstitial cystitis (chronic) without hematuria: Secondary | ICD-10-CM | POA: Diagnosis not present

## 2019-07-14 DIAGNOSIS — M79641 Pain in right hand: Secondary | ICD-10-CM | POA: Diagnosis not present

## 2019-07-14 DIAGNOSIS — M25521 Pain in right elbow: Secondary | ICD-10-CM | POA: Diagnosis not present

## 2019-07-14 DIAGNOSIS — M79642 Pain in left hand: Secondary | ICD-10-CM | POA: Diagnosis not present

## 2019-07-15 DIAGNOSIS — M47816 Spondylosis without myelopathy or radiculopathy, lumbar region: Secondary | ICD-10-CM | POA: Diagnosis not present

## 2019-07-19 ENCOUNTER — Other Ambulatory Visit: Payer: Self-pay | Admitting: Internal Medicine

## 2019-07-19 DIAGNOSIS — J301 Allergic rhinitis due to pollen: Secondary | ICD-10-CM

## 2019-07-27 MED FILL — MONOJECT DISP SYRINGE 20 ML: 20 ML | 30 days supply | Qty: 30 | Fill #0

## 2019-07-27 MED FILL — BUPIVACAINE 0.5% VIAL: 0.5 | 15 days supply | Qty: 450 | Fill #0

## 2019-07-27 MED FILL — HEPARIN SOD 10,000 UNIT/ML: 10000 | 30 days supply | Qty: 120 | Fill #0

## 2019-07-27 MED FILL — BD NEEDLES 18GX1.5: 18G X 1-1/2 | 30 days supply | Qty: 30 | Fill #0

## 2019-07-28 DIAGNOSIS — M79642 Pain in left hand: Secondary | ICD-10-CM | POA: Diagnosis not present

## 2019-07-28 DIAGNOSIS — M79641 Pain in right hand: Secondary | ICD-10-CM | POA: Diagnosis not present

## 2019-07-28 DIAGNOSIS — M25521 Pain in right elbow: Secondary | ICD-10-CM | POA: Diagnosis not present

## 2019-07-31 DIAGNOSIS — R200 Anesthesia of skin: Secondary | ICD-10-CM | POA: Diagnosis not present

## 2019-08-09 DIAGNOSIS — Z79891 Long term (current) use of opiate analgesic: Secondary | ICD-10-CM | POA: Diagnosis not present

## 2019-08-09 DIAGNOSIS — R0789 Other chest pain: Secondary | ICD-10-CM | POA: Diagnosis not present

## 2019-08-09 DIAGNOSIS — R1110 Vomiting, unspecified: Secondary | ICD-10-CM | POA: Diagnosis not present

## 2019-08-09 DIAGNOSIS — R1310 Dysphagia, unspecified: Secondary | ICD-10-CM | POA: Diagnosis not present

## 2019-08-09 DIAGNOSIS — K219 Gastro-esophageal reflux disease without esophagitis: Secondary | ICD-10-CM | POA: Diagnosis not present

## 2019-08-09 DIAGNOSIS — M47816 Spondylosis without myelopathy or radiculopathy, lumbar region: Secondary | ICD-10-CM | POA: Diagnosis not present

## 2019-08-09 DIAGNOSIS — K449 Diaphragmatic hernia without obstruction or gangrene: Secondary | ICD-10-CM | POA: Diagnosis not present

## 2019-08-09 DIAGNOSIS — R079 Chest pain, unspecified: Secondary | ICD-10-CM | POA: Diagnosis not present

## 2019-08-10 DIAGNOSIS — G894 Chronic pain syndrome: Secondary | ICD-10-CM | POA: Diagnosis not present

## 2019-08-10 DIAGNOSIS — F332 Major depressive disorder, recurrent severe without psychotic features: Secondary | ICD-10-CM | POA: Diagnosis not present

## 2019-08-10 DIAGNOSIS — M47816 Spondylosis without myelopathy or radiculopathy, lumbar region: Secondary | ICD-10-CM | POA: Diagnosis not present

## 2019-08-10 DIAGNOSIS — I10 Essential (primary) hypertension: Secondary | ICD-10-CM | POA: Diagnosis not present

## 2019-08-10 MED FILL — BD NEEDLES 18GX1.5: 18G X 1-1/2 | 30 days supply | Qty: 30 | Fill #0

## 2019-08-10 MED FILL — MONOJECT DISP SYRINGE 20 ML: 20 ML | 30 days supply | Qty: 30 | Fill #0

## 2019-08-10 MED FILL — HEPARIN SOD 10,000 UNIT/ML: 10000 | 30 days supply | Qty: 120 | Fill #0

## 2019-08-11 DIAGNOSIS — R0789 Other chest pain: Secondary | ICD-10-CM | POA: Diagnosis not present

## 2019-08-11 DIAGNOSIS — R1310 Dysphagia, unspecified: Secondary | ICD-10-CM | POA: Diagnosis not present

## 2019-08-11 DIAGNOSIS — K219 Gastro-esophageal reflux disease without esophagitis: Secondary | ICD-10-CM | POA: Diagnosis not present

## 2019-08-17 DIAGNOSIS — K219 Gastro-esophageal reflux disease without esophagitis: Secondary | ICD-10-CM | POA: Diagnosis not present

## 2019-08-18 DIAGNOSIS — M7711 Lateral epicondylitis, right elbow: Secondary | ICD-10-CM | POA: Diagnosis not present

## 2019-08-27 DIAGNOSIS — R339 Retention of urine, unspecified: Secondary | ICD-10-CM | POA: Diagnosis not present

## 2019-09-10 DIAGNOSIS — K228 Other specified diseases of esophagus: Secondary | ICD-10-CM | POA: Diagnosis not present

## 2019-09-10 DIAGNOSIS — K2950 Unspecified chronic gastritis without bleeding: Secondary | ICD-10-CM | POA: Diagnosis not present

## 2019-09-10 DIAGNOSIS — K319 Disease of stomach and duodenum, unspecified: Secondary | ICD-10-CM | POA: Diagnosis not present

## 2019-09-10 DIAGNOSIS — K2100 Gastro-esophageal reflux disease with esophagitis, without bleeding: Secondary | ICD-10-CM | POA: Diagnosis not present

## 2019-09-10 DIAGNOSIS — K2970 Gastritis, unspecified, without bleeding: Secondary | ICD-10-CM | POA: Diagnosis not present

## 2019-09-10 DIAGNOSIS — K219 Gastro-esophageal reflux disease without esophagitis: Secondary | ICD-10-CM | POA: Diagnosis not present

## 2019-09-14 DIAGNOSIS — K5900 Constipation, unspecified: Secondary | ICD-10-CM | POA: Diagnosis not present

## 2019-09-14 DIAGNOSIS — K219 Gastro-esophageal reflux disease without esophagitis: Secondary | ICD-10-CM | POA: Diagnosis not present

## 2019-09-14 DIAGNOSIS — R140 Abdominal distension (gaseous): Secondary | ICD-10-CM | POA: Diagnosis not present

## 2019-09-14 DIAGNOSIS — R1084 Generalized abdominal pain: Secondary | ICD-10-CM | POA: Diagnosis not present

## 2019-09-21 DIAGNOSIS — K760 Fatty (change of) liver, not elsewhere classified: Secondary | ICD-10-CM | POA: Diagnosis not present

## 2019-09-21 DIAGNOSIS — R748 Abnormal levels of other serum enzymes: Secondary | ICD-10-CM | POA: Diagnosis not present

## 2019-09-21 DIAGNOSIS — I7300 Raynaud's syndrome without gangrene: Secondary | ICD-10-CM | POA: Diagnosis not present

## 2019-09-21 DIAGNOSIS — M0579 Rheumatoid arthritis with rheumatoid factor of multiple sites without organ or systems involvement: Secondary | ICD-10-CM | POA: Diagnosis not present

## 2019-09-23 DIAGNOSIS — K449 Diaphragmatic hernia without obstruction or gangrene: Secondary | ICD-10-CM | POA: Diagnosis not present

## 2019-09-23 DIAGNOSIS — K2100 Gastro-esophageal reflux disease with esophagitis, without bleeding: Secondary | ICD-10-CM | POA: Diagnosis not present

## 2019-10-01 DIAGNOSIS — M47816 Spondylosis without myelopathy or radiculopathy, lumbar region: Secondary | ICD-10-CM | POA: Diagnosis not present

## 2019-10-01 DIAGNOSIS — M47896 Other spondylosis, lumbar region: Secondary | ICD-10-CM | POA: Diagnosis not present

## 2019-10-07 DIAGNOSIS — K2100 Gastro-esophageal reflux disease with esophagitis, without bleeding: Secondary | ICD-10-CM | POA: Diagnosis not present
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
## 2019-10-19 ENCOUNTER — Other Ambulatory Visit: Payer: Self-pay

## 2019-10-19 ENCOUNTER — Encounter: Payer: Self-pay | Admitting: Neurology

## 2019-10-19 ENCOUNTER — Ambulatory Visit (INDEPENDENT_AMBULATORY_CARE_PROVIDER_SITE_OTHER): Payer: BC Managed Care – PPO | Admitting: Neurology

## 2019-10-19 VITALS — BP 135/72 | HR 71 | Ht 67.0 in | Wt 214.8 lb

## 2019-10-19 DIAGNOSIS — Z23 Encounter for immunization: Secondary | ICD-10-CM | POA: Diagnosis not present

## 2019-10-19 DIAGNOSIS — G43709 Chronic migraine without aura, not intractable, without status migrainosus: Secondary | ICD-10-CM

## 2019-10-19 DIAGNOSIS — Z79899 Other long term (current) drug therapy: Secondary | ICD-10-CM | POA: Diagnosis not present

## 2019-10-19 DIAGNOSIS — K760 Fatty (change of) liver, not elsewhere classified: Secondary | ICD-10-CM | POA: Diagnosis not present

## 2019-10-19 DIAGNOSIS — R748 Abnormal levels of other serum enzymes: Secondary | ICD-10-CM | POA: Diagnosis not present

## 2019-10-19 DIAGNOSIS — M0579 Rheumatoid arthritis with rheumatoid factor of multiple sites without organ or systems involvement: Secondary | ICD-10-CM | POA: Diagnosis not present

## 2019-10-19 DIAGNOSIS — I7300 Raynaud's syndrome without gangrene: Secondary | ICD-10-CM | POA: Diagnosis not present

## 2019-10-19 MED ORDER — AIMOVIG 70 MG/ML ~~LOC~~ SOAJ: 70.0000 mg | SUBCUTANEOUS | 5 refills | Status: DC

## 2019-10-19 MED ORDER — CYCLOBENZAPRINE HCL 10 MG PO TABS: 10.0000 mg | ORAL_TABLET | Freq: Every evening | ORAL | 5 refills | Status: DC | PRN

## 2019-10-19 NOTE — Patient Instructions (Signed)
  1. Stop venlafaxine.  Start Aimovig 70mg  injection every 28 days. 2. Take Nurtec dissolvable tablet at earliest onset of headache.  May repeat dose once in 1 tablet if needed.  Maximum 1 tablet in 24 hours. If effective, contact me for prescription. 3. For neck pain, stop tizanidine.  Start cyclobenzaprine 10mg  at bedtime as needed. 4. Limit use of pain relievers to no more than 2 days out of the week.  These medications include acetaminophen, NSAIDs (ibuprofen/Advil/Motrin, naproxen/Aleve, triptans (Imitrex/sumatriptan), Excedrin, and narcotics.  This will help reduce risk of rebound headaches. 5. Be aware of common food triggers:  - Caffeine:  coffee, black tea, cola, Mt. Dew  - Chocolate  - Dairy:  aged cheeses (brie, blue, cheddar, gouda, Katie, provolone, [place], Swiss, etc), chocolate milk, buttermilk, sour cream, limit eggs and yogurt  - Nuts, peanut butter  - Alcohol  - Cereals/grains:  FRESH breads (fresh bagels, sourdough, doughnuts), yeast productions  - Processed/canned/aged/cured meats (pre-packaged deli meats, hotdogs)  - MSG/glutamate:  soy sauce, flavor enhancer, pickled/preserved/marinated foods  - Sweeteners:  aspartame (Equal, Nutrasweet).  Sugar and Splenda are okay  - Vegetables:  legumes (lima beans, lentils, snow peas, fava beans, pinto peans, peas, garbanzo beans), sauerkraut, onions, olives, pickles  - Fruit:  avocados, bananas, citrus fruit (orange, lemon, grapefruit), mango  - Other:  Frozen meals, macaroni and cheese 6. Routine exercise 7. Stay adequately hydrated (aim for 64 oz water daily) 8. Keep headache diary 9. Maintain proper stress management 10. Maintain proper sleep hygiene 11. Do not skip meals 12. Consider supplements:  magnesium citrate 400mg  daily, riboflavin 400mg  daily, coenzyme Q10 100mg  three times daily. 13. Follow up in 4 to 6 months.

## 2019-10-21 ENCOUNTER — Encounter: Payer: Self-pay | Admitting: Neurology

## 2019-10-21 ENCOUNTER — Telehealth: Payer: Self-pay | Admitting: Neurology

## 2019-10-21 NOTE — Progress Notes (Signed)
Manvir Guerrero [place] (Key: Trenda Moots) Aimovig 70MG /ML auto-injectors   Form [facility] Electronic PA Form [phone] NCPDP) Created 41 minutes ago Sent to Plan 39 minutes ago Plan Response 38 minutes ago Submit Clinical Questions 37 minutes ago Determination Favorable 36 minutes ago Message from Plan CaseId:[id number];Status:Approved;Review Type:Prior Auth;Coverage Start Date:09/21/2019;Coverage End Date:10/20/2020;

## 2019-10-21 NOTE — Telephone Encounter (Signed)
Patient called in saying that the 2 Nurtec pills that she was given to try are working for her. Thanks!

## 2019-10-22 ENCOUNTER — Encounter: Payer: Self-pay | Admitting: Neurology

## 2019-10-22 ENCOUNTER — Other Ambulatory Visit: Payer: Self-pay | Admitting: Neurology

## 2019-10-22 DIAGNOSIS — R339 Retention of urine, unspecified: Secondary | ICD-10-CM | POA: Diagnosis not present

## 2019-10-22 MED ORDER — NURTEC 75 MG PO TBDP: 75.0000 mg | ORAL_TABLET | Freq: Every day | ORAL | 5 refills | Status: AC | PRN

## 2019-10-22 NOTE — Telephone Encounter (Signed)
Pt advised script sent.

## 2019-10-22 NOTE — Telephone Encounter (Signed)
I have sent a prescription to the [facility] on [address]

## 2019-10-22 NOTE — Progress Notes (Signed)
[name] (Key: [id number]) Rx #: [id number] Nurtec 75MG  dispersible tablets   Form [facility] Electronic PA Form [phone] NCPDP) Created 1 hour ago Sent to Plan 2 minutes ago Plan Response 2 minutes ago Submit Clinical Questions 1 minute ago Determination Favorable less than a minute ago Message from Plan CaseId:[id number];Status:Approved;Review Type:Prior Auth;Coverage Start Date:09/22/2019;Coverage End Date:10/21/2020;

## 2019-10-27 DIAGNOSIS — F419 Anxiety disorder, unspecified: Secondary | ICD-10-CM | POA: Diagnosis not present

## 2019-10-27 DIAGNOSIS — J45909 Unspecified asthma, uncomplicated: Secondary | ICD-10-CM | POA: Diagnosis not present

## 2019-10-27 DIAGNOSIS — K219 Gastro-esophageal reflux disease without esophagitis: Secondary | ICD-10-CM | POA: Diagnosis not present

## 2019-10-27 DIAGNOSIS — M069 Rheumatoid arthritis, unspecified: Secondary | ICD-10-CM | POA: Diagnosis not present

## 2019-10-27 DIAGNOSIS — E785 Hyperlipidemia, unspecified: Secondary | ICD-10-CM | POA: Diagnosis not present

## 2019-10-27 DIAGNOSIS — M797 Fibromyalgia: Secondary | ICD-10-CM | POA: Diagnosis not present

## 2019-10-27 DIAGNOSIS — K449 Diaphragmatic hernia without obstruction or gangrene: Secondary | ICD-10-CM | POA: Diagnosis not present

## 2019-10-27 DIAGNOSIS — M5030 Other cervical disc degeneration, unspecified cervical region: Secondary | ICD-10-CM | POA: Diagnosis not present

## 2019-10-27 DIAGNOSIS — G43909 Migraine, unspecified, not intractable, without status migrainosus: Secondary | ICD-10-CM | POA: Diagnosis not present

## 2019-10-27 DIAGNOSIS — N3010 Interstitial cystitis (chronic) without hematuria: Secondary | ICD-10-CM | POA: Diagnosis not present

## 2019-10-27 DIAGNOSIS — F329 Major depressive disorder, single episode, unspecified: Secondary | ICD-10-CM | POA: Diagnosis not present

## 2019-10-27 DIAGNOSIS — I10 Essential (primary) hypertension: Secondary | ICD-10-CM | POA: Diagnosis not present

## 2019-10-27 DIAGNOSIS — F4310 Post-traumatic stress disorder, unspecified: Secondary | ICD-10-CM | POA: Diagnosis not present

## 2019-10-27 DIAGNOSIS — M5136 Other intervertebral disc degeneration, lumbar region: Secondary | ICD-10-CM | POA: Diagnosis not present

## 2019-10-27 DIAGNOSIS — Z79899 Other long term (current) drug therapy: Secondary | ICD-10-CM | POA: Diagnosis not present

## 2019-10-27 DIAGNOSIS — Z981 Arthrodesis status: Secondary | ICD-10-CM | POA: Diagnosis not present

## 2019-10-28 DIAGNOSIS — F4310 Post-traumatic stress disorder, unspecified: Secondary | ICD-10-CM | POA: Diagnosis not present

## 2019-10-28 DIAGNOSIS — F329 Major depressive disorder, single episode, unspecified: Secondary | ICD-10-CM | POA: Diagnosis not present

## 2019-10-28 DIAGNOSIS — M5136 Other intervertebral disc degeneration, lumbar region: Secondary | ICD-10-CM | POA: Diagnosis not present

## 2019-10-28 DIAGNOSIS — M069 Rheumatoid arthritis, unspecified: Secondary | ICD-10-CM | POA: Diagnosis not present

## 2019-10-28 DIAGNOSIS — G43909 Migraine, unspecified, not intractable, without status migrainosus: Secondary | ICD-10-CM | POA: Diagnosis not present

## 2019-10-28 DIAGNOSIS — Z981 Arthrodesis status: Secondary | ICD-10-CM | POA: Diagnosis not present

## 2019-10-28 DIAGNOSIS — M797 Fibromyalgia: Secondary | ICD-10-CM | POA: Diagnosis not present

## 2019-10-28 DIAGNOSIS — N3010 Interstitial cystitis (chronic) without hematuria: Secondary | ICD-10-CM | POA: Diagnosis not present

## 2019-10-28 DIAGNOSIS — F419 Anxiety disorder, unspecified: Secondary | ICD-10-CM | POA: Diagnosis not present

## 2019-10-28 DIAGNOSIS — J45909 Unspecified asthma, uncomplicated: Secondary | ICD-10-CM | POA: Diagnosis not present

## 2019-10-28 DIAGNOSIS — K219 Gastro-esophageal reflux disease without esophagitis: Secondary | ICD-10-CM | POA: Diagnosis not present

## 2019-10-28 DIAGNOSIS — I10 Essential (primary) hypertension: Secondary | ICD-10-CM | POA: Diagnosis not present

## 2019-10-28 DIAGNOSIS — Z79899 Other long term (current) drug therapy: Secondary | ICD-10-CM | POA: Diagnosis not present

## 2019-10-28 DIAGNOSIS — E785 Hyperlipidemia, unspecified: Secondary | ICD-10-CM | POA: Diagnosis not present

## 2019-10-28 DIAGNOSIS — K449 Diaphragmatic hernia without obstruction or gangrene: Secondary | ICD-10-CM | POA: Diagnosis not present

## 2019-10-28 DIAGNOSIS — M5030 Other cervical disc degeneration, unspecified cervical region: Secondary | ICD-10-CM | POA: Diagnosis not present

## 2019-11-01 DIAGNOSIS — Z9889 Other specified postprocedural states: Secondary | ICD-10-CM | POA: Diagnosis not present

## 2019-11-01 DIAGNOSIS — R059 Cough, unspecified: Secondary | ICD-10-CM | POA: Diagnosis not present

## 2019-11-01 DIAGNOSIS — R079 Chest pain, unspecified: Secondary | ICD-10-CM | POA: Diagnosis not present

## 2019-11-01 DIAGNOSIS — Z8719 Personal history of other diseases of the digestive system: Secondary | ICD-10-CM | POA: Diagnosis not present

## 2019-11-03 DIAGNOSIS — Z91040 Latex allergy status: Secondary | ICD-10-CM | POA: Diagnosis not present

## 2019-11-03 DIAGNOSIS — G43909 Migraine, unspecified, not intractable, without status migrainosus: Secondary | ICD-10-CM | POA: Diagnosis not present

## 2019-11-03 DIAGNOSIS — R1310 Dysphagia, unspecified: Secondary | ICD-10-CM | POA: Diagnosis not present

## 2019-11-03 DIAGNOSIS — I10 Essential (primary) hypertension: Secondary | ICD-10-CM | POA: Diagnosis not present

## 2019-11-03 DIAGNOSIS — Z886 Allergy status to analgesic agent status: Secondary | ICD-10-CM | POA: Diagnosis not present

## 2019-11-03 DIAGNOSIS — R1084 Generalized abdominal pain: Secondary | ICD-10-CM | POA: Diagnosis not present

## 2019-11-03 DIAGNOSIS — K222 Esophageal obstruction: Secondary | ICD-10-CM | POA: Diagnosis not present

## 2019-11-03 DIAGNOSIS — R112 Nausea with vomiting, unspecified: Secondary | ICD-10-CM | POA: Diagnosis not present

## 2019-11-03 DIAGNOSIS — Z885 Allergy status to narcotic agent status: Secondary | ICD-10-CM | POA: Diagnosis not present

## 2019-11-03 DIAGNOSIS — R52 Pain, unspecified: Secondary | ICD-10-CM | POA: Diagnosis not present

## 2019-11-03 DIAGNOSIS — K9189 Other postprocedural complications and disorders of digestive system: Secondary | ICD-10-CM | POA: Diagnosis not present

## 2019-11-03 DIAGNOSIS — R0902 Hypoxemia: Secondary | ICD-10-CM | POA: Diagnosis not present

## 2019-11-03 DIAGNOSIS — Z7901 Long term (current) use of anticoagulants: Secondary | ICD-10-CM | POA: Diagnosis not present

## 2019-11-03 DIAGNOSIS — R001 Bradycardia, unspecified: Secondary | ICD-10-CM | POA: Diagnosis not present

## 2019-11-03 DIAGNOSIS — R1319 Other dysphagia: Secondary | ICD-10-CM | POA: Diagnosis not present

## 2019-11-03 DIAGNOSIS — Z8249 Family history of ischemic heart disease and other diseases of the circulatory system: Secondary | ICD-10-CM | POA: Diagnosis not present

## 2019-11-03 DIAGNOSIS — Z20822 Contact with and (suspected) exposure to covid-19: Secondary | ICD-10-CM | POA: Diagnosis not present

## 2019-11-03 DIAGNOSIS — M069 Rheumatoid arthritis, unspecified: Secondary | ICD-10-CM | POA: Diagnosis not present

## 2019-11-03 DIAGNOSIS — I498 Other specified cardiac arrhythmias: Secondary | ICD-10-CM | POA: Diagnosis not present

## 2019-11-03 DIAGNOSIS — E785 Hyperlipidemia, unspecified: Secondary | ICD-10-CM | POA: Diagnosis not present

## 2019-11-03 DIAGNOSIS — R42 Dizziness and giddiness: Secondary | ICD-10-CM | POA: Diagnosis not present

## 2019-11-22 DIAGNOSIS — R339 Retention of urine, unspecified: Secondary | ICD-10-CM | POA: Diagnosis not present

## 2019-11-30 DIAGNOSIS — M542 Cervicalgia: Secondary | ICD-10-CM | POA: Diagnosis not present

## 2019-11-30 DIAGNOSIS — G894 Chronic pain syndrome: Secondary | ICD-10-CM | POA: Diagnosis not present

## 2019-11-30 DIAGNOSIS — M5136 Other intervertebral disc degeneration, lumbar region: Secondary | ICD-10-CM | POA: Diagnosis not present

## 2019-12-01 DIAGNOSIS — M25562 Pain in left knee: Secondary | ICD-10-CM | POA: Diagnosis not present

## 2019-12-06 DIAGNOSIS — Z8719 Personal history of other diseases of the digestive system: Secondary | ICD-10-CM | POA: Diagnosis not present

## 2019-12-06 DIAGNOSIS — R1310 Dysphagia, unspecified: Secondary | ICD-10-CM | POA: Diagnosis not present

## 2019-12-06 DIAGNOSIS — Z9889 Other specified postprocedural states: Secondary | ICD-10-CM | POA: Diagnosis not present

## 2019-12-10 DIAGNOSIS — M25562 Pain in left knee: Secondary | ICD-10-CM | POA: Diagnosis not present

## 2019-12-15 DIAGNOSIS — K222 Esophageal obstruction: Secondary | ICD-10-CM | POA: Diagnosis not present

## 2019-12-15 DIAGNOSIS — R1319 Other dysphagia: Secondary | ICD-10-CM | POA: Diagnosis not present

## 2019-12-15 DIAGNOSIS — Z9889 Other specified postprocedural states: Secondary | ICD-10-CM | POA: Diagnosis not present

## 2019-12-15 DIAGNOSIS — K3189 Other diseases of stomach and duodenum: Secondary | ICD-10-CM | POA: Diagnosis not present

## 2019-12-15 DIAGNOSIS — R079 Chest pain, unspecified: Secondary | ICD-10-CM | POA: Diagnosis not present

## 2019-12-27 DIAGNOSIS — M79641 Pain in right hand: Secondary | ICD-10-CM | POA: Diagnosis not present

## 2019-12-27 DIAGNOSIS — M79631 Pain in right forearm: Secondary | ICD-10-CM | POA: Diagnosis not present

## 2020-01-03 DIAGNOSIS — S62662A Nondisplaced fracture of distal phalanx of right middle finger, initial encounter for closed fracture: Secondary | ICD-10-CM | POA: Diagnosis not present

## 2020-01-17 DIAGNOSIS — Z79891 Long term (current) use of opiate analgesic: Secondary | ICD-10-CM | POA: Diagnosis not present

## 2020-01-18 DIAGNOSIS — I7300 Raynaud's syndrome without gangrene: Secondary | ICD-10-CM | POA: Diagnosis not present

## 2020-01-18 DIAGNOSIS — K760 Fatty (change of) liver, not elsewhere classified: Secondary | ICD-10-CM | POA: Diagnosis not present

## 2020-01-18 DIAGNOSIS — M0579 Rheumatoid arthritis with rheumatoid factor of multiple sites without organ or systems involvement: Secondary | ICD-10-CM | POA: Diagnosis not present

## 2020-01-18 DIAGNOSIS — M797 Fibromyalgia: Secondary | ICD-10-CM | POA: Diagnosis not present

## 2020-02-01 DIAGNOSIS — R059 Cough, unspecified: Secondary | ICD-10-CM | POA: Diagnosis not present

## 2020-02-01 DIAGNOSIS — F411 Generalized anxiety disorder: Secondary | ICD-10-CM | POA: Diagnosis not present

## 2020-02-01 DIAGNOSIS — G894 Chronic pain syndrome: Secondary | ICD-10-CM | POA: Diagnosis not present

## 2020-02-01 DIAGNOSIS — Z20822 Contact with and (suspected) exposure to covid-19: Secondary | ICD-10-CM | POA: Diagnosis not present

## 2020-02-01 DIAGNOSIS — J4541 Moderate persistent asthma with (acute) exacerbation: Secondary | ICD-10-CM | POA: Diagnosis not present

## 2020-02-01 DIAGNOSIS — R0989 Other specified symptoms and signs involving the circulatory and respiratory systems: Secondary | ICD-10-CM | POA: Diagnosis not present

## 2020-02-08 ENCOUNTER — Other Ambulatory Visit: Payer: Self-pay | Admitting: Neurology

## 2020-02-08 ENCOUNTER — Telehealth: Payer: Self-pay

## 2020-02-08 MED ORDER — AIMOVIG 70 MG/ML ~~LOC~~ SOAJ: 70.0000 mg | SUBCUTANEOUS | 1 refills | Status: DC

## 2020-02-08 MED ORDER — CYCLOBENZAPRINE HCL 10 MG PO TABS: 10.0000 mg | ORAL_TABLET | Freq: Every evening | ORAL | 1 refills | Status: AC | PRN

## 2020-02-08 NOTE — Telephone Encounter (Signed)
[facility] from [facility] for Cyclobenzprine 10 mg Aimovig 70 mg   Please send in a 90 day supply

## 2020-02-08 NOTE — Telephone Encounter (Signed)
Done

## 2020-02-15 ENCOUNTER — Other Ambulatory Visit: Payer: Self-pay | Admitting: *Deleted

## 2020-02-15 DIAGNOSIS — M0579 Rheumatoid arthritis with rheumatoid factor of multiple sites without organ or systems involvement: Secondary | ICD-10-CM | POA: Diagnosis not present

## 2020-02-23 ENCOUNTER — Other Ambulatory Visit: Payer: Self-pay

## 2020-02-23 ENCOUNTER — Encounter: Payer: Self-pay | Admitting: Internal Medicine

## 2020-02-23 ENCOUNTER — Ambulatory Visit (INDEPENDENT_AMBULATORY_CARE_PROVIDER_SITE_OTHER): Payer: BC Managed Care – PPO | Admitting: Internal Medicine

## 2020-02-23 VITALS — BP 120/74 | HR 93 | Ht 67.0 in | Wt 195.0 lb

## 2020-02-23 DIAGNOSIS — M25562 Pain in left knee: Secondary | ICD-10-CM | POA: Diagnosis not present

## 2020-02-23 DIAGNOSIS — G4733 Obstructive sleep apnea (adult) (pediatric): Secondary | ICD-10-CM

## 2020-02-23 DIAGNOSIS — J301 Allergic rhinitis due to pollen: Secondary | ICD-10-CM

## 2020-02-23 DIAGNOSIS — J4540 Moderate persistent asthma, uncomplicated: Secondary | ICD-10-CM | POA: Diagnosis not present

## 2020-02-23 DIAGNOSIS — M2021 Hallux rigidus, right foot: Secondary | ICD-10-CM | POA: Diagnosis not present

## 2020-02-23 MED ORDER — ALBUTEROL SULFATE HFA 108 (90 BASE) MCG/ACT IN AERS: 2.0000 | INHALATION_SPRAY | Freq: Four times a day (QID) | RESPIRATORY_TRACT | 5 refills | Status: AC | PRN

## 2020-02-23 MED ORDER — DIPHENHYDRAMINE HCL 50 MG PO CAPS: 50.0000 mg | ORAL_CAPSULE | Freq: Every day | ORAL | 5 refills | Status: DC

## 2020-02-23 MED ORDER — MONTELUKAST SODIUM 10 MG PO TABS: 10.0000 mg | ORAL_TABLET | Freq: Every day | ORAL | 5 refills | Status: DC

## 2020-02-23 MED ORDER — ADVAIR HFA 115-21 MCG/ACT IN AERO: 2.0000 | INHALATION_SPRAY | Freq: Two times a day (BID) | RESPIRATORY_TRACT | 5 refills | Status: DC

## 2020-02-23 NOTE — Patient Instructions (Addendum)
The patient should have follow up scheduled with myself in 3 months.   I have ordered a home sleep study  Start taking advair two times a day again. Start taking singular in the morning. Can take benadryl as needed at night time for allergies.   Take the albuterol rescue inhaler every 4 to 6 hours as needed for wheezing or shortness of breath. You can also take it 15 minutes before exercise or exertional activity. Side effects include heart racing or pounding, jitters or anxiety. If you have a history of an irregular heart rhythm, it can make this worse. Can also give some patients a hard time sleeping.  Check the back of the inhaler to keep track of the total number of doses left on the inhaler.
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
## 2020-02-24 ENCOUNTER — Other Ambulatory Visit (HOSPITAL_COMMUNITY): Payer: Self-pay | Admitting: Urology

## 2020-02-24 DIAGNOSIS — N3010 Interstitial cystitis (chronic) without hematuria: Secondary | ICD-10-CM | POA: Diagnosis not present

## 2020-02-24 DIAGNOSIS — Z87442 Personal history of urinary calculi: Secondary | ICD-10-CM | POA: Diagnosis not present

## 2020-02-29 ENCOUNTER — Encounter: Payer: Self-pay | Admitting: Internal Medicine

## 2020-03-03 ENCOUNTER — Ambulatory Visit: Payer: BC Managed Care – PPO

## 2020-03-03 ENCOUNTER — Other Ambulatory Visit: Payer: Self-pay

## 2020-03-03 DIAGNOSIS — G4733 Obstructive sleep apnea (adult) (pediatric): Secondary | ICD-10-CM

## 2020-03-09 DIAGNOSIS — R1319 Other dysphagia: Secondary | ICD-10-CM | POA: Diagnosis not present

## 2020-03-10 ENCOUNTER — Telehealth: Payer: Self-pay | Admitting: Pulmonary Disease

## 2020-03-10 DIAGNOSIS — G4733 Obstructive sleep apnea (adult) (pediatric): Secondary | ICD-10-CM

## 2020-03-10 NOTE — Telephone Encounter (Signed)
ATC patient to let her know of sleep study results, per DPR left detailed message of results and advised patient to call the office back to discuss further.

## 2020-03-10 NOTE — Telephone Encounter (Signed)
Call patient  Sleep study result  Date of study: 03/04/2020  Impression: Moderate obstructive sleep apnea Mild oxygen desaturations  Recommendation: DME referral  Recommend CPAP therapy for moderate obstructive sleep apnea  Auto titrating CPAP with pressure settings of 5-15 will be appropriate  Encourage weight loss measures  Follow-up in the office 4 to 6 weeks following initiation of treatment   FYI: Dr. Shearon Stalls, patient will be called with study result

## 2020-03-13 NOTE — Telephone Encounter (Signed)
Patient is returning phone call. Patient phone number is [phone].

## 2020-03-13 NOTE — Telephone Encounter (Signed)
Spoke with pt and reviewed sleep study results. Places CPAP order. Pt stated understanding. Nothing further needed at this time.

## 2020-03-22 DIAGNOSIS — Z9889 Other specified postprocedural states: Secondary | ICD-10-CM | POA: Diagnosis not present

## 2020-03-22 DIAGNOSIS — K660 Peritoneal adhesions (postprocedural) (postinfection): Secondary | ICD-10-CM | POA: Diagnosis not present

## 2020-03-22 DIAGNOSIS — I10 Essential (primary) hypertension: Secondary | ICD-10-CM | POA: Diagnosis not present

## 2020-03-22 DIAGNOSIS — R1319 Other dysphagia: Secondary | ICD-10-CM | POA: Diagnosis not present

## 2020-03-23 DIAGNOSIS — R1319 Other dysphagia: Secondary | ICD-10-CM | POA: Diagnosis not present

## 2020-03-23 DIAGNOSIS — I10 Essential (primary) hypertension: Secondary | ICD-10-CM | POA: Diagnosis not present

## 2020-04-11 ENCOUNTER — Telehealth: Payer: Self-pay | Admitting: Pulmonary Disease

## 2020-04-11 NOTE — Telephone Encounter (Signed)
LMTCB with patient   Noted. Will route message to [facility] team to see if they have any other recommendations before reaching out to provider.   Ladies is there another company that having been moving at a faster rate than the rest? Thanks.   Once response received from [facility] team can forward to provider with their recommendations as well as to see if maybe LUNA cpap device is sufficient for patient.

## 2020-04-21 NOTE — Telephone Encounter (Signed)
All the dme's are on back order with the cpaps sorry [name]

## 2020-04-21 NOTE — Telephone Encounter (Signed)
Is the Luna machine also on backorder or is this CPAP machine one that we might be able to get sooner for pt if provider okays it?

## 2020-04-25 NOTE — Telephone Encounter (Signed)
Even the [employer] are out at the dme's  They are trying to get more Joellen Jersey
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
## 2020-04-26 NOTE — Telephone Encounter (Signed)
Pt aware that the Wynonia Musty is also on backorder unfortunately. She verbalized understanding. Nothing further needed.

## 2020-04-27 ENCOUNTER — Ambulatory Visit: Payer: BC Managed Care – PPO | Admitting: Neurology

## 2020-04-27 DIAGNOSIS — N3010 Interstitial cystitis (chronic) without hematuria: Secondary | ICD-10-CM | POA: Diagnosis not present

## 2020-04-27 DIAGNOSIS — R350 Frequency of micturition: Secondary | ICD-10-CM | POA: Diagnosis not present

## 2020-04-27 DIAGNOSIS — M7918 Myalgia, other site: Secondary | ICD-10-CM | POA: Diagnosis not present

## 2020-04-27 DIAGNOSIS — R3915 Urgency of urination: Secondary | ICD-10-CM | POA: Diagnosis not present

## 2020-05-12 DIAGNOSIS — K219 Gastro-esophageal reflux disease without esophagitis: Secondary | ICD-10-CM | POA: Diagnosis not present

## 2020-05-12 DIAGNOSIS — R112 Nausea with vomiting, unspecified: Secondary | ICD-10-CM | POA: Diagnosis not present

## 2020-05-12 DIAGNOSIS — R1310 Dysphagia, unspecified: Secondary | ICD-10-CM | POA: Diagnosis not present

## 2020-05-12 DIAGNOSIS — K224 Dyskinesia of esophagus: Secondary | ICD-10-CM | POA: Diagnosis not present

## 2020-05-23 ENCOUNTER — Telehealth: Payer: Self-pay

## 2020-05-23 NOTE — Telephone Encounter (Signed)
Telephone call from [facility]. Need script for Linzess.    Advised rep that we do write for that type of medication.

## 2020-05-25 ENCOUNTER — Telehealth: Payer: Self-pay | Admitting: *Deleted

## 2020-05-25 DIAGNOSIS — J301 Allergic rhinitis due to pollen: Secondary | ICD-10-CM

## 2020-05-25 MED ORDER — MONTELUKAST SODIUM 10 MG PO TABS: 10.0000 mg | ORAL_TABLET | Freq: Every morning | ORAL | 0 refills | Status: DC

## 2020-05-25 NOTE — Telephone Encounter (Signed)
Called and spoke with patient regarding request for refill for Montelukast.  She states that [facility] never has her medications and she always has to wait for them so [facility].  I advised her that [facility]  I made her a f/u appointment with Dr. Shearon Stalls in May.  Nothing further needed.

## 2020-05-30 DIAGNOSIS — F4310 Post-traumatic stress disorder, unspecified: Secondary | ICD-10-CM | POA: Diagnosis not present

## 2020-05-30 DIAGNOSIS — F331 Major depressive disorder, recurrent, moderate: Secondary | ICD-10-CM | POA: Diagnosis not present

## 2020-05-30 DIAGNOSIS — F411 Generalized anxiety disorder: Secondary | ICD-10-CM | POA: Diagnosis not present

## 2020-05-30 DIAGNOSIS — I10 Essential (primary) hypertension: Secondary | ICD-10-CM | POA: Diagnosis not present

## 2020-06-05 ENCOUNTER — Ambulatory Visit: Payer: BC Managed Care – PPO | Admitting: Internal Medicine

## 2020-06-05 DIAGNOSIS — H40013 Open angle with borderline findings, low risk, bilateral: Secondary | ICD-10-CM | POA: Diagnosis not present

## 2020-06-05 DIAGNOSIS — H04123 Dry eye syndrome of bilateral lacrimal glands: Secondary | ICD-10-CM | POA: Diagnosis not present

## 2020-06-28 DIAGNOSIS — F4310 Post-traumatic stress disorder, unspecified: Secondary | ICD-10-CM | POA: Diagnosis not present

## 2020-06-28 DIAGNOSIS — F411 Generalized anxiety disorder: Secondary | ICD-10-CM | POA: Diagnosis not present

## 2020-06-28 DIAGNOSIS — F331 Major depressive disorder, recurrent, moderate: Secondary | ICD-10-CM | POA: Diagnosis not present

## 2020-07-03 ENCOUNTER — Ambulatory Visit (INDEPENDENT_AMBULATORY_CARE_PROVIDER_SITE_OTHER): Payer: BC Managed Care – PPO | Admitting: Internal Medicine

## 2020-07-03 ENCOUNTER — Encounter: Payer: Self-pay | Admitting: Internal Medicine

## 2020-07-03 ENCOUNTER — Other Ambulatory Visit: Payer: Self-pay

## 2020-07-03 VITALS — BP 128/80 | HR 71 | Temp 96.3°F | Ht 67.0 in | Wt 186.0 lb

## 2020-07-03 DIAGNOSIS — G4733 Obstructive sleep apnea (adult) (pediatric): Secondary | ICD-10-CM | POA: Diagnosis not present

## 2020-07-03 DIAGNOSIS — J301 Allergic rhinitis due to pollen: Secondary | ICD-10-CM | POA: Diagnosis not present

## 2020-07-03 DIAGNOSIS — J4550 Severe persistent asthma, uncomplicated: Secondary | ICD-10-CM | POA: Diagnosis not present

## 2020-07-03 MED ORDER — DIPHENHYDRAMINE HCL 50 MG PO CAPS: 50.0000 mg | ORAL_CAPSULE | Freq: Every day | ORAL | 5 refills | Status: AC

## 2020-07-03 MED ORDER — ADVAIR HFA 230-21 MCG/ACT IN AERO: 2.0000 | INHALATION_SPRAY | Freq: Two times a day (BID) | RESPIRATORY_TRACT | 12 refills | Status: DC

## 2020-07-03 MED ORDER — AZELASTINE HCL 0.1 % NA SOLN: 1.0000 | Freq: Two times a day (BID) | NASAL | 12 refills | Status: AC
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
## 2020-07-03 NOTE — Patient Instructions (Addendum)
The patient should have follow up scheduled with myself in 4 months.   Prior to next visit patient should have: Spirometry/Feno  Increase Advair to 2 puffs in the morning, 2 puffs at night. I have sent a higher prescription to your pharmacy.  astelin nasal spray- 1 spray on each side of your nose twice a day for first week, then 1 spray on each side.   Instructions for use:  If you also use a saline nasal spray or rinse, use that first.  Position the head with the chin slightly tucked. Use the right hand to spray into the left nostril and the right hand to spray into the left nostril.   Point the bottle away from the septum of your nose (cartilage that divides the two sides of your nose).   Hold the nostril closed on the opposite side from where you will spray  Spray once and gently sniff to pull the medicine into the higher parts of your nose.  Don't sniff too hard as the medicine will drain down the back of your throat instead.  Repeat with a second spray on the same side if prescribed.  Repeat on the other side of your nose.   Send me a message on mychart to let me know how that is going in 2-4 weeks. If you aren't feeling better we can start a biologic injection medication for your asthma.

## 2020-07-04 DIAGNOSIS — M797 Fibromyalgia: Secondary | ICD-10-CM | POA: Diagnosis not present

## 2020-07-04 DIAGNOSIS — Z79899 Other long term (current) drug therapy: Secondary | ICD-10-CM | POA: Diagnosis not present

## 2020-07-04 DIAGNOSIS — M0579 Rheumatoid arthritis with rheumatoid factor of multiple sites without organ or systems involvement: Secondary | ICD-10-CM | POA: Diagnosis not present

## 2020-07-04 DIAGNOSIS — K760 Fatty (change of) liver, not elsewhere classified: Secondary | ICD-10-CM | POA: Diagnosis not present

## 2020-07-04 DIAGNOSIS — I7300 Raynaud's syndrome without gangrene: Secondary | ICD-10-CM | POA: Diagnosis not present

## 2020-07-04 LAB — RESPIRATORY ALLERGY PROFILE REGION II ~~LOC~~

## 2020-07-04 LAB — EOSINOPHIL COUNT
Eosinophils Absolute: 29 cells/uL (ref 15–500)
Eosinophils Relative: 0.7 %
WBC: 4.2 10*3/uL (ref 3.8–10.8)

## 2020-07-04 LAB — INTERPRETATION:

## 2020-07-13 DIAGNOSIS — R1310 Dysphagia, unspecified: Secondary | ICD-10-CM | POA: Diagnosis not present

## 2020-07-13 DIAGNOSIS — K2100 Gastro-esophageal reflux disease with esophagitis, without bleeding: Secondary | ICD-10-CM | POA: Diagnosis not present

## 2020-07-13 DIAGNOSIS — R112 Nausea with vomiting, unspecified: Secondary | ICD-10-CM | POA: Diagnosis not present

## 2020-07-13 DIAGNOSIS — T182XXA Foreign body in stomach, initial encounter: Secondary | ICD-10-CM | POA: Diagnosis not present

## 2020-07-19 ENCOUNTER — Other Ambulatory Visit: Payer: Self-pay | Admitting: Neurology

## 2020-07-19 NOTE — Telephone Encounter (Signed)
No further refills until seen, patient has appt July 2022

## 2020-07-24 DIAGNOSIS — R112 Nausea with vomiting, unspecified: Secondary | ICD-10-CM | POA: Diagnosis not present

## 2020-07-25 DIAGNOSIS — K921 Melena: Secondary | ICD-10-CM | POA: Diagnosis not present

## 2020-07-25 DIAGNOSIS — R109 Unspecified abdominal pain: Secondary | ICD-10-CM | POA: Diagnosis not present

## 2020-07-25 DIAGNOSIS — R112 Nausea with vomiting, unspecified: Secondary | ICD-10-CM | POA: Diagnosis not present

## 2020-07-25 DIAGNOSIS — R1319 Other dysphagia: Secondary | ICD-10-CM | POA: Diagnosis not present

## 2020-07-25 DIAGNOSIS — R197 Diarrhea, unspecified: Secondary | ICD-10-CM | POA: Diagnosis not present

## 2020-07-25 DIAGNOSIS — K219 Gastro-esophageal reflux disease without esophagitis: Secondary | ICD-10-CM | POA: Diagnosis not present

## 2020-07-25 DIAGNOSIS — R1310 Dysphagia, unspecified: Secondary | ICD-10-CM | POA: Diagnosis not present

## 2020-07-25 DIAGNOSIS — R1012 Left upper quadrant pain: Secondary | ICD-10-CM | POA: Diagnosis not present

## 2020-07-25 DIAGNOSIS — Z79899 Other long term (current) drug therapy: Secondary | ICD-10-CM | POA: Diagnosis not present
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
## 2020-07-28 ENCOUNTER — Ambulatory Visit: Payer: BC Managed Care – PPO | Admitting: Neurology

## 2020-08-05 DIAGNOSIS — G43009 Migraine without aura, not intractable, without status migrainosus: Secondary | ICD-10-CM | POA: Diagnosis not present

## 2020-08-05 DIAGNOSIS — R519 Headache, unspecified: Secondary | ICD-10-CM | POA: Diagnosis not present

## 2020-08-11 DIAGNOSIS — M0579 Rheumatoid arthritis with rheumatoid factor of multiple sites without organ or systems involvement: Secondary | ICD-10-CM | POA: Diagnosis not present

## 2020-08-24 DIAGNOSIS — R350 Frequency of micturition: Secondary | ICD-10-CM | POA: Diagnosis not present

## 2020-08-24 DIAGNOSIS — Z87442 Personal history of urinary calculi: Secondary | ICD-10-CM | POA: Diagnosis not present

## 2020-08-24 DIAGNOSIS — R3915 Urgency of urination: Secondary | ICD-10-CM | POA: Diagnosis not present

## 2020-08-24 DIAGNOSIS — N3010 Interstitial cystitis (chronic) without hematuria: Secondary | ICD-10-CM | POA: Diagnosis not present

## 2020-09-26 ENCOUNTER — Telehealth: Payer: Self-pay

## 2020-09-26 NOTE — Telephone Encounter (Signed)
New message   Message from Plan  CaseId:[id number];Status:Approved;Review Type:Prior Auth;Coverage Start Date:08/27/2020;Coverage End Date:09/26/2021;  Mendi Swapp [place] (Key: [id number]) Aimovig '70MG'$ /ML auto-injectors   Form [employer] Electronic PA Form [id number] NCPDP) Created 5 days ago Sent to Plan 9 minutes ago Plan Response 9 minutes ago Submit Clinical Questions 1 minute ago Determination Favorable less than a minute ago

## 2020-10-05 NOTE — Telephone Encounter (Signed)
New message  [place] (Key: [id number]) Nurtec '75MG'$  dispersible tablets   Form [employer] Electronic PA Form [phone] NCPDP) Created 13 days ago Sent to Plan 5 minutes ago Plan Response 4 minutes ago Submit Clinical Questions 1 minute ago Determination Favorable 1 minute ago Message from Plan CaseId:[id number];Status:Approved;Review Type:Prior Auth;Coverage Start Date:09/05/2020;Coverage End Date:10/05/2021;

## 2021-01-15 ENCOUNTER — Other Ambulatory Visit: Payer: Self-pay | Admitting: Internal Medicine

## 2021-01-15 DIAGNOSIS — J301 Allergic rhinitis due to pollen: Secondary | ICD-10-CM

## 2021-05-01 ENCOUNTER — Telehealth: Payer: Self-pay | Admitting: Internal Medicine

## 2021-05-02 MED ORDER — ADVAIR HFA 230-21 MCG/ACT IN AERO
2.0000 | INHALATION_SPRAY | Freq: Two times a day (BID) | RESPIRATORY_TRACT | 5 refills | Status: AC
Start: 2021-05-02 — End: ?

## 2021-05-02 NOTE — Telephone Encounter (Signed)
Called and spoke with patient to confirm she is using the [employer]. She stated that she is. RX has been sent to [facility].  ?

## 2021-08-15 ENCOUNTER — Other Ambulatory Visit: Payer: Self-pay | Admitting: Internal Medicine

## 2021-08-15 DIAGNOSIS — J301 Allergic rhinitis due to pollen: Secondary | ICD-10-CM

## 2021-08-15 NOTE — Telephone Encounter (Signed)
Please advise on refill request

## 2021-08-29 ENCOUNTER — Other Ambulatory Visit (HOSPITAL_COMMUNITY): Payer: Self-pay

## 2021-08-29 ENCOUNTER — Telehealth (HOSPITAL_COMMUNITY): Payer: Self-pay | Admitting: Pharmacy Technician

## 2021-08-29 NOTE — Telephone Encounter (Signed)
[place] is unable to do prior authorization.  Must get prior authorization through primary submitting shortly.  Lyndel Safe, [place] Patient Advocate Specialist [place] Patient Advocate Team Direct Number: [phone]  Fax: [phone]

## 2021-08-29 NOTE — Telephone Encounter (Signed)
Patient Advocate Encounter   Received notification that prior authorization for Aimovig '70MG'$ /ML auto-injectors is required.   PA submitted on 08/29/2021 Key [id number] Status is pending       Lyndel Safe, [place] Patient Advocate Specialist [place] Patient Advocate Team Direct Number: [phone]  Fax: [phone]

## 2021-08-30 ENCOUNTER — Telehealth (HOSPITAL_COMMUNITY): Payer: Self-pay | Admitting: Pharmacy Technician

## 2021-08-30 NOTE — Telephone Encounter (Signed)
-----   Message from Venetia Night, [place] sent at 08/29/2021  3:53 PM EDT ----- Patient hasn't been seen since 09/2019, No PA needed until she has an appointment.  ----- Message ----- From: Lenon Oms, CPhT Sent: 08/29/2021   3:09 PM EDT To: Venetia Night, CMA  I need a copy of her primary insurance not the [place].  Her [place] is not able to find her when I search her.  I need card to make sure out the insurance has her name spelt.  Thanks

## 2021-09-12 NOTE — Telephone Encounter (Signed)
Closing encounter

## 2022-04-16 ENCOUNTER — Other Ambulatory Visit (HOSPITAL_COMMUNITY): Payer: Self-pay

## 2022-04-17 ENCOUNTER — Other Ambulatory Visit (HOSPITAL_COMMUNITY): Payer: Self-pay

## 2022-04-18 ENCOUNTER — Other Ambulatory Visit (HOSPITAL_COMMUNITY): Payer: Self-pay

## 2022-04-18 MED ORDER — SYRINGE 20-25 ML 25 ML MISC: 99 refills | Status: AC

## 2022-04-18 MED ORDER — "BD DISP NEEDLES 18G X 1-1/2"" MISC": 99 refills | Status: AC

## 2022-04-18 MED ORDER — CATHETER SELF-ADHESIVE URINARY MISC: 0 refills | Status: AC

## 2023-12-10 ENCOUNTER — Telehealth: Payer: Self-pay | Admitting: *Deleted

## 2023-12-10 NOTE — Telephone Encounter (Signed)
 Received CMN from [facility] for CPAP supplies.  Form completed/signed by Dr. Neda and faxed to [phone] on 11/20/2023.  Received confirmation fax that it was sent successfully.  Nothing further needed.

## 2023-12-30 NOTE — Telephone Encounter (Signed)
 Copied from CRM #[id number]. Topic: Medical Record Request - Provider/Facility Request >> Dec 24, 2023  4:04 PM Leila C wrote: Reason for CRM: Mindy from [facility] [phone] ext [phone] states CMN cpap supplies new forms was faxed 12/22/23, because the original fax 11/20/23 Dr. Neda cross out date and [facility] cannot accept this. Mindy states Dr. Neda can initials and dated on the old fax or sign the new CMN form faxed 12/22/23.

## 2024-01-01 NOTE — Telephone Encounter (Signed)
 Can we have one of the other physicians or APP's sign the forms needed?  Not in the office till next week

## 2024-01-09 NOTE — Telephone Encounter (Signed)
 All mail in dr.olalere mailbox have been faxed

## 2024-01-12 ENCOUNTER — Telehealth: Payer: Self-pay | Admitting: *Deleted

## 2024-01-12 NOTE — Telephone Encounter (Signed)
 Copied from CRM #[id number]. Topic: Medical Record Request - Provider/Facility Request >> Jan 09, 2024  3:41 PM Russell PARAS wrote: [name], with [facility], is contacting clinic to request most recent OV notes pertaining to pt's CPAP  FX#  [phone]  CB#  [phone], ext [phone]  This pt has not been seen within last 3 years and therefore considered new pt  She was seen more recently by [facility], and if DME calls back they should call them for notes  Closing encounter

## 2024-03-05 ENCOUNTER — Other Ambulatory Visit: Payer: Self-pay | Admitting: Medical Genetics

## 2024-03-09 ENCOUNTER — Other Ambulatory Visit: Payer: Self-pay
